# Patient Record
Sex: Female | Born: 1950 | Race: White | Hispanic: No | Marital: Married | Smoking: Never smoker
Health system: Southern US, Community
[De-identification: ages and names within clinical notes are randomized; demographics above are authoritative.]

## PROBLEM LIST (undated history)

## (undated) DIAGNOSIS — Z8489 Family history of other specified conditions: Secondary | ICD-10-CM

## (undated) DIAGNOSIS — I10 Essential (primary) hypertension: Secondary | ICD-10-CM

## (undated) DIAGNOSIS — R112 Nausea with vomiting, unspecified: Secondary | ICD-10-CM

## (undated) DIAGNOSIS — Z9889 Other specified postprocedural states: Secondary | ICD-10-CM

## (undated) DIAGNOSIS — E213 Hyperparathyroidism, unspecified: Secondary | ICD-10-CM

## (undated) DIAGNOSIS — E119 Type 2 diabetes mellitus without complications: Secondary | ICD-10-CM

## (undated) HISTORY — PX: ABDOMINAL HYSTERECTOMY: SHX81

## (undated) HISTORY — PX: TONSILLECTOMY: SUR1361

## (undated) HISTORY — DX: Essential (primary) hypertension: I10

## (undated) HISTORY — PX: CHOLECYSTECTOMY: SHX55

## (undated) HISTORY — DX: Type 2 diabetes mellitus without complications: E11.9

## (undated) HISTORY — DX: Hyperparathyroidism, unspecified: E21.3

## (undated) HISTORY — PX: PARATHYROIDECTOMY: SHX19

---

## 2017-11-28 DIAGNOSIS — C801 Malignant (primary) neoplasm, unspecified: Secondary | ICD-10-CM

## 2017-11-28 HISTORY — DX: Malignant (primary) neoplasm, unspecified: C80.1

## 2019-12-03 ENCOUNTER — Encounter: Payer: Self-pay | Admitting: Oncology

## 2019-12-03 ENCOUNTER — Ambulatory Visit
Admission: RE | Admit: 2019-12-03 | Discharge: 2019-12-03 | Disposition: A | Discharge status: Home / Self Care | Payer: Self-pay | Source: Ambulatory Visit | Attending: Hematology and Oncology | Admitting: Hematology and Oncology

## 2019-12-03 ENCOUNTER — Telehealth: Payer: Self-pay | Admitting: Oncology

## 2019-12-03 DIAGNOSIS — C55 Malignant neoplasm of uterus, part unspecified: Secondary | ICD-10-CM

## 2019-12-03 NOTE — Telephone Encounter (Signed)
I can see her next Tuesday at 9 or Wed at 10 am Need 1 hour due to materials for review I cannot guarantee any treatment to be started until I see her

## 2019-12-03 NOTE — Telephone Encounter (Signed)
Called and notified Dr. Romie Levee office with [facility] that patient has an appointment on 12/11/2019 with Dr. Alvy Bimler.

## 2019-12-03 NOTE — Telephone Encounter (Signed)
Called Sandra Arroyo back and scheduled new patient appointment with Dr. Alvy Bimler on 12/11/19 at 10 am.

## 2019-12-03 NOTE — Telephone Encounter (Signed)
Called Sandra Arroyo and discussed her referral.  She said she is going to be staying with her sister, [name]. The address will be [address] [place], [place], [place] [address].  She will keep her cell phone number and also provided her sister's number as [phone].  She said she can be in [place] anytime next week for an appointment.  She had her last cycle of chemotherapy on 11/12/19 (docetaxel plus carboplatin and was due for her next cycle yesterday.  She said it was delayed because she had a lot of side effects from her last cycle.  She lost 14 lbs and had weakness and diarrhea.    Discussed her imaging and she said everything was done at [facility].  Requested powershare of PET scan from 08/27/19 and CT abd/pelvis from 05/06/19 with [facility] CT.

## 2019-12-09 ENCOUNTER — Inpatient Hospital Stay
Admission: RE | Admit: 2019-12-09 | Discharge: 2019-12-09 | Disposition: A | Discharge status: Home / Self Care | Payer: Self-pay | Source: Ambulatory Visit | Attending: Hematology and Oncology | Admitting: Hematology and Oncology

## 2019-12-09 ENCOUNTER — Encounter: Payer: Self-pay | Admitting: Oncology

## 2019-12-09 ENCOUNTER — Other Ambulatory Visit (HOSPITAL_COMMUNITY): Payer: Self-pay | Admitting: Hematology and Oncology

## 2019-12-09 DIAGNOSIS — C801 Malignant (primary) neoplasm, unspecified: Secondary | ICD-10-CM

## 2019-12-11 ENCOUNTER — Encounter: Payer: Self-pay | Admitting: Hematology and Oncology

## 2019-12-11 ENCOUNTER — Encounter: Payer: Self-pay | Admitting: Oncology

## 2019-12-11 ENCOUNTER — Inpatient Hospital Stay: Payer: Medicare Other

## 2019-12-11 ENCOUNTER — Other Ambulatory Visit: Payer: Self-pay

## 2019-12-11 ENCOUNTER — Inpatient Hospital Stay: Payer: Medicare Other | Attending: Hematology and Oncology | Admitting: Hematology and Oncology

## 2019-12-11 DIAGNOSIS — R5383 Other fatigue: Secondary | ICD-10-CM | POA: Diagnosis not present

## 2019-12-11 DIAGNOSIS — T451X5A Adverse effect of antineoplastic and immunosuppressive drugs, initial encounter: Secondary | ICD-10-CM

## 2019-12-11 DIAGNOSIS — Z809 Family history of malignant neoplasm, unspecified: Secondary | ICD-10-CM

## 2019-12-11 DIAGNOSIS — C779 Secondary and unspecified malignant neoplasm of lymph node, unspecified: Secondary | ICD-10-CM | POA: Insufficient documentation

## 2019-12-11 DIAGNOSIS — C55 Malignant neoplasm of uterus, part unspecified: Secondary | ICD-10-CM | POA: Diagnosis present

## 2019-12-11 DIAGNOSIS — R918 Other nonspecific abnormal finding of lung field: Secondary | ICD-10-CM | POA: Diagnosis not present

## 2019-12-11 DIAGNOSIS — D61810 Antineoplastic chemotherapy induced pancytopenia: Secondary | ICD-10-CM | POA: Diagnosis not present

## 2019-12-11 DIAGNOSIS — E119 Type 2 diabetes mellitus without complications: Secondary | ICD-10-CM

## 2019-12-11 DIAGNOSIS — C778 Secondary and unspecified malignant neoplasm of lymph nodes of multiple regions: Secondary | ICD-10-CM

## 2019-12-11 DIAGNOSIS — G620 Drug-induced polyneuropathy: Secondary | ICD-10-CM | POA: Insufficient documentation

## 2019-12-11 DIAGNOSIS — I10 Essential (primary) hypertension: Secondary | ICD-10-CM | POA: Diagnosis not present

## 2019-12-11 DIAGNOSIS — Z7189 Other specified counseling: Secondary | ICD-10-CM

## 2019-12-11 DIAGNOSIS — D61818 Other pancytopenia: Secondary | ICD-10-CM | POA: Insufficient documentation

## 2019-12-11 LAB — COMPREHENSIVE METABOLIC PANEL
ALT: 13 U/L (ref 0–44)
AST: 16 U/L (ref 15–41)
Albumin: 3.7 g/dL (ref 3.5–5.0)
Alkaline Phosphatase: 57 U/L (ref 38–126)
Anion gap: 9 (ref 5–15)
BUN: 11 mg/dL (ref 8–23)
CO2: 24 mmol/L (ref 22–32)
Calcium: 9.3 mg/dL (ref 8.9–10.3)
Chloride: 109 mmol/L (ref 98–111)
Creatinine, Ser: 0.79 mg/dL (ref 0.44–1.00)
GFR calc Af Amer: >60 mL/min (ref >60)
GFR calc non Af Amer: >60 mL/min (ref >60)
Glucose, Bld: 145 mg/dL — ABNORMAL HIGH (ref 70–99)
Potassium: 4.2 mmol/L (ref 3.5–5.1)
Sodium: 142 mmol/L (ref 135–145)
Total Bilirubin: 0.6 mg/dL (ref 0.3–1.2)
Total Protein: 6.5 g/dL (ref 6.5–8.1)

## 2019-12-11 LAB — CBC WITH DIFFERENTIAL/PLATELET
Abs Immature Granulocytes: 0.07 10*3/uL (ref 0.00–0.07)
Basophils Absolute: 0.1 10*3/uL (ref 0.0–0.1)
Basophils Relative: 2 %
Eosinophils Absolute: 0.1 10*3/uL (ref 0.0–0.5)
Eosinophils Relative: 2 %
HCT: 33.6 % — ABNORMAL LOW (ref 36.0–46.0)
Hemoglobin: 10.8 g/dL — ABNORMAL LOW (ref 12.0–15.0)
Immature Granulocytes: 2 %
Lymphocytes Relative: 23 %
Lymphs Abs: 0.8 10*3/uL (ref 0.7–4.0)
MCH: 31 pg (ref 26.0–34.0)
MCHC: 32.1 g/dL (ref 30.0–36.0)
MCV: 96.6 fL (ref 80.0–100.0)
Monocytes Absolute: 0.4 10*3/uL (ref 0.1–1.0)
Monocytes Relative: 12 %
Neutro Abs: 2 10*3/uL (ref 1.7–7.7)
Neutrophils Relative %: 59 %
Platelets: 230 10*3/uL (ref 150–400)
RBC: 3.48 MIL/uL — ABNORMAL LOW (ref 3.87–5.11)
RDW: 15.7 % — ABNORMAL HIGH (ref 11.5–15.5)
WBC: 3.3 10*3/uL — ABNORMAL LOW (ref 4.0–10.5)
nRBC: 0 % (ref 0.0–0.2)

## 2019-12-11 LAB — TSH: TSH: 1.26 u[IU]/mL (ref 0.308–3.960)

## 2019-12-11 NOTE — Progress Notes (Signed)
[facility] at [phone] and requested Her2Neu/Fish report.  Met with Santiago Glad during Dr. Calton Dach appointment.  Gave her the [employer] and encouraged her to call with any questions.

## 2019-12-12 ENCOUNTER — Telehealth: Payer: Self-pay | Admitting: Hematology and Oncology

## 2019-12-12 DIAGNOSIS — Z7189 Other specified counseling: Secondary | ICD-10-CM | POA: Insufficient documentation

## 2019-12-12 NOTE — Assessment & Plan Note (Signed)
She is on multiple blood pressure medications She has lost a lot of weight She may not need that many different type of blood pressure medications Recommend she checks her blood pressure twice a day and we will review her blood pressure from home to decide if her medications need to be adjusted

## 2019-12-12 NOTE — Assessment & Plan Note (Addendum)
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

## 2019-12-12 NOTE — Assessment & Plan Note (Signed)
She had recent severe pancytopenia due to treatment She would benefit from G-CSF support for future treatment For now, we discussed neutropenic precaution

## 2019-12-12 NOTE — Assessment & Plan Note (Signed)
She has stable, mild grade 2 neuropathy affecting the left foot that does not bother her We will observe for now

## 2019-12-12 NOTE — Progress Notes (Signed)
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, status post 3 cycle of carboplatin and Taxotere in [place], complicated by severe weight loss, pancytopenia, mild neuropathy and weight loss There are still missing information from over 300 pages of outside records I will request pathology report from September 13, 2019 for completion of records I recommend ordering baseline blood work along with CT scan of the chest, abdomen and pelvis for assessment of response of therapy before we proceed with further treatment I will also schedule chemo education class when she returns She is in agreement  Diabetes mellitus without complication, without long-term current use of insulin ([place]) Her diabetes control is reasonable She will continue current prescribed medications  Essential hypertension She is on multiple blood pressure medications She has lost a lot of weight She may not need that many different type of blood pressure medications Recommend she checks her blood pressure twice a day and we will review her blood pressure from home to decide if her medications need to be adjusted  Family history of cancer I will review her case with genetic counselor to see if she will qualify for genetic testing  Other fatigue I will check her thyroid function test  Pancytopenia, acquired ([place]) She had recent severe pancytopenia due to treatment She would benefit from G-CSF support for future treatment For now, we discussed neutropenic precaution  Peripheral neuropathy due to chemotherapy [facility]) She has stable, mild grade 2 neuropathy affecting the left foot that does not bother her We will observe for now  Goals of care, counseling/discussion I have not have a chance to address  goals of care with her and her sister Typically, patients with recurrent cancer are not curable We will have further discussions about goals of care in her next visit   Orders Placed This Encounter  Procedures  . CT Chest W Contrast    Standing Status:   Future    Standing Expiration Date:   12/10/2020    Order Specific Question:   If indicated for the ordered procedure, I authorize the administration of contrast media per Radiology protocol    Answer:   Yes    Order Specific Question:   Preferred imaging location?    Answer:   [facility]    Order Specific Question:   Radiology Contrast Protocol - do NOT remove file path    Answer:   \\[email or web address]  . CT Abdomen Pelvis W Contrast    Standing Status:   Future    Standing Expiration Date:   12/10/2020    Order Specific Question:   If indicated for the ordered procedure, I authorize the administration of contrast media per Radiology protocol    Answer:   Yes    Order Specific Question:   Preferred imaging location?    Answer:   [facility]    Order Specific Question:   Radiology Contrast Protocol - do NOT remove file path    Answer:   \\[email or web address]  . Comprehensive metabolic panel    Standing Status:   Future    Number of Occurrences:   1    Standing Expiration Date:   01/14/2021  . TSH    Standing Status:   Future    Number of Occurrences:   1    Standing  Expiration Date:   01/14/2021  . CBC with Differential    Standing Status:   Future    Number of Occurrences:   1    Standing Expiration Date:   01/14/2021    The total time spent in the appointment was 95 minutes encounter with patients including review of chart and various tests results, discussions about plan of care and coordination of care plan   All questions were answered. The patient knows to call the clinic with any problems, questions or concerns. No barriers to learning was detected.  Heath Lark, MD 1/14/20218:24 AM  CHIEF COMPLAINTS/PURPOSE OF CONSULTATION:  Recurrent uterine cancer, transfer of care from [place] to [place] due to better family support here locally  HISTORY OF PRESENTING ILLNESS:  Sandra Arroyo 69 y.o. female is here because of recent diagnosis of recurrent uterine cancer Her sister, and is also present The patient is semiretired.  She is married with 3 children She is relocating to be with her sister, and who is able to provide better supportive care for her while she is undergoing chemotherapy I have reviewed over 300 pages of outside records in aggregate related to her diagnosis of recurrence of endometrial cancer  I have reviewed her chart and materials related to her cancer extensively and collaborated history with the patient. Summary of oncologic history is as follows: Oncology History Overview Note  Her2 neg PD-L neg Low mutation burden MSI stable NTRK neg   Uterine cancer ([place])  12/01/2017 Initial Diagnosis   She presented with postmenopausal bleeding   12/12/2017 Surgery   She had complete hysterectomy, BSO and bilateral pelvic sentinel lymph node excision   01/08/2018 Tumor Marker   Patient's tumor was tested for the following markers: CA-125 Results of the tumor marker test revealed 70.   01/18/2018 Imaging   CT scan of the chest, abdomen and pelvis elsewhere show lung nodules, kidney lesion as well as 1.5 x 1 cm left, iliac lymph node   01/23/2018 - 06/12/2018 Chemotherapy   The patient had adjuvant chemotherapy with carboplatin and taxol elsewhere for chemotherapy treatment.     06/25/2018 Imaging   CT scan of the chest, abdomen and pelvis showed persistent lung nodules, kidney lesion but reduced size of previously noted pelvic lymphadenopathy to 6 x 4 mm   07/09/2018 - 08/17/2018 Radiation Therapy   She had daily radiation treatment for 5 weeks elsewhere in [place], 28 days treatment, 5040 cGy   09/25/2018 Imaging   CT scan of  the chest, abdomen and pelvis shows stable lung nodule and kidney lesion.  Previously noted enlarged lymphadenopathy in the external iliac region has resolved   01/16/2019 Imaging   CT imaging of the chest, abdomen and pelvis elsewhere showed stable kidney lesions but no evidence of cancer recurrence   05/16/2019 Imaging   Outside CT scan showed stable pulmonary nodules.  Bilateral simple renal cyst with an exophytic lesion in the medial cortex of the lower pole of the right kidney measure 10 mm with enhancement postcontrast.  There is 6 mm lymph node in the left para-aortic region.  There is an enlarging hyper enhancing aortocaval lymph node measuring 5 mm.   08/27/2019 PET scan   There is left supraclavicular lymph node measure 7 mm, another additional supraclavicular region 9 mm with SUV of 8.3.  There is abnormal uptake in retrocrural lymph nodes, SUV 3.7.  There is elevated FDG uptake in the patient's known retroperitoneal lymph nodes.  There is a small solid-appearing right  inferior medial renal lesion with no FDG uptake.   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   10/09/2019 -  Chemotherapy   The patient had docetaxel and carboplatin for chemotherapy treatment elsewhere in [place].  Her treatment was initially given weekly.  Cycle 1 was complicated by neutropenia.  Cycle 2 was dose adjusted and delay.  Cycle 2 treatment was complicated by loss of appetite, severe diarrhea and persistent neutropenia.  Cycle 3 was delay as well.  She went to the emergency department twice for IV fluid hydration and have lost a lot of weight.     She was supposed to get cycle 4 of chemotherapy a week ago but due to delayed recovery from multiple side effects of treatment, that was not given Currently, she is at approximately 60% of her energy  level from baseline Her appetite is gradually improving and she have stopped losing weight She has been to local emergency department several times for severe dehydration Despite recurrent neutropenia, she has not been admitted to the hospital for neutropenic fever Her last cycle of treatment was originally given on a weekly basis.  After multiple adjustment and delay, it was subsequently changed to every 3 weeks She was prescribed multiple courses of fluconazole and antibiotics for infection/thrush She had watery bowel movement requiring Imodium as well as Lomotil but her bowel habits are more formed at this point in time She denies nausea or vomiting She has very minor peripheral neuropathy from prior treatment but it does not bother her She denies new palpable lymphadenopathy Her diabetes control at home is reasonable She has not checked her blood pressure lately She denies pain  MEDICAL HISTORY:  Past Medical History:  Diagnosis Date  . Diabetes mellitus without complication ([place])   . Hyperparathyroidism, unspecified ([place])   . Hypertension     SURGICAL HISTORY: Past Surgical History:  Procedure Laterality Date  . ABDOMINAL HYSTERECTOMY    . CESAREAN SECTION    . PARATHYROIDECTOMY    . TONSILLECTOMY      SOCIAL HISTORY: Social History   Socioeconomic History  . Marital status: Married    Spouse name: Edd Arbour  . Number of children: 3  . Years of education: Not on file  . Highest education level: Not on file  Occupational History  . Occupation: Psychologist, educational  Tobacco Use  . Smoking status: Never Smoker  . Smokeless tobacco: Never Used  Substance and Sexual Activity  . Alcohol use: Never  . Drug use: Not on file  . Sexual activity: Not on file  Other Topics Concern  . Not on file  Social History Narrative   3 children in [place] Determinants of Health   Financial Resource Strain:   . Difficulty of Paying Living Expenses: Not on file  Food Insecurity:    . Worried About Charity fundraiser in the Last Year: Not on file  . Ran Out of Food in the Last Year: Not on file  Transportation Needs:   . Lack of Transportation (Medical): Not on file  . Lack of Transportation (Non-Medical): Not on file  Physical Activity:   . Days of Exercise per Week: Not on file  . Minutes of Exercise per Session: Not on file  Stress:   . Feeling of Stress : Not on file  Social Connections:   .  Frequency of Communication with Friends and Family: Not on file  . Frequency of Social Gatherings with Friends and Family: Not on file  . Attends Religious Services: Not on file  . Active Member of Clubs or Organizations: Not on file  . Attends Archivist Meetings: Not on file  . Marital Status: Not on file  Intimate Partner Violence:   . Fear of Current or Ex-Partner: Not on file  . Emotionally Abused: Not on file  . Physically Abused: Not on file  . Sexually Abused: Not on file    FAMILY HISTORY: Family History  Problem Relation Age of Onset  . Cancer Mother 63       breast ca  . Cancer Father        testicular, kidney cancer, liver  . Stroke Father   . Diabetes Father   . Cancer Brother        testicular cancer    ALLERGIES:  has no allergies on file.  MEDICATIONS:  Current Outpatient Medications  Medication Sig Dispense Refill  . aspirin EC 81 MG tablet Take 81 mg by mouth daily.    Marland Kitchen glipiZIDE (GLUCOTROL) 5 MG tablet Take 0.5 tablets by mouth 2 (two) times daily.    Marland Kitchen lidocaine-prilocaine (EMLA) cream Apply 1 application topically as needed.    Marland Kitchen losartan (COZAAR) 100 MG tablet Take 100 mg by mouth daily.    . metoprolol succinate (TOPROL-XL) 100 MG 24 hr tablet Take 100 mg by mouth daily. Take with or immediately following a meal.    . prochlorperazine (COMPAZINE) 10 MG tablet Take 10 mg by mouth every 6 (six) hours as needed.    . rosuvastatin (CRESTOR) 10 MG tablet Take 10 mg by mouth daily.    Marland Kitchen terazosin (HYTRIN) 2 MG capsule Take  2 mg by mouth at bedtime.     No current facility-administered medications for this visit.    REVIEW OF SYSTEMS:   Constitutional: Denies fevers, chills or abnormal night sweats Eyes: Denies blurriness of vision, double vision or watery eyes Ears, nose, mouth, throat, and face: Denies mucositis or sore throat Respiratory: Denies cough, dyspnea or wheezes Cardiovascular: Denies palpitation, chest discomfort or lower extremity swelling Skin: Denies abnormal skin rashes Lymphatics: Denies new lymphadenopathy or easy bruising Behavioral/Psych: Mood is stable, no new changes  All other systems were reviewed with the patient and are negative.  PHYSICAL EXAMINATION: ECOG PERFORMANCE STATUS: 1 - Symptomatic but completely ambulatory  Vitals:   12/11/19 0958  BP: (!) 154/74  Pulse: 63  Resp: 18  Temp: 98 F (36.7 C)  SpO2: 98%   Filed Weights   12/11/19 0958  Weight: 154 lb 9.6 oz (70.1 kg)    GENERAL:alert, no distress and comfortable SKIN: skin color, texture, turgor are normal, no rashes or significant lesions EYES: normal, conjunctiva are pink and non-injected, sclera clear OROPHARYNX:no exudate, no erythema and lips, buccal mucosa, and tongue normal  NECK: supple, thyroid normal size, non-tender, without nodularity LYMPH:  no palpable lymphadenopathy in the cervical, axillary or inguinal LUNGS: clear to auscultation and percussion with normal breathing effort HEART: regular rate & rhythm and no murmurs and no lower extremity edema ABDOMEN:abdomen soft, non-tender and normal bowel sounds.  Noted well-healed surgical scar Musculoskeletal:no cyanosis of digits and no clubbing  PSYCH: alert & oriented x 3 with fluent speech NEURO: no focal motor/sensory deficits  LABORATORY DATA:  I have reviewed the data as listed Lab Results  Component Value Date   WBC  3.3 (L) 12/11/2019   HGB 10.8 (L) 12/11/2019   HCT 33.6 (L) 12/11/2019   MCV 96.6 12/11/2019   PLT 230 12/11/2019    Recent Labs    12/11/19 1100  NA 142  K 4.2  CL 109  CO2 24  GLUCOSE 145*  BUN 11  CREATININE 0.79  CALCIUM 9.3  GFRNONAA >60  GFRAA >60  PROT 6.5  ALBUMIN 3.7  AST 16  ALT 13  ALKPHOS 57  BILITOT 0.6    RADIOGRAPHIC STUDIES: I have reviewed imaging study that was downloaded on PACS I have personally reviewed the radiological images as listed and agreed with the findings in the report.

## 2019-12-12 NOTE — Assessment & Plan Note (Signed)
I have not have a chance to address goals of care with her and her sister Typically, patients with recurrent cancer are not curable We will have further discussions about goals of care in her next visit

## 2019-12-12 NOTE — Assessment & Plan Note (Signed)
I will check her thyroid function test

## 2019-12-12 NOTE — Telephone Encounter (Signed)
Scheduled appt per 11/4 sch message - unable to reach pt . Left message with appt date and time   

## 2019-12-12 NOTE — Assessment & Plan Note (Signed)
I will review her case with genetic counselor to see if she will qualify for genetic testing

## 2019-12-12 NOTE — Assessment & Plan Note (Signed)
Her diabetes control is reasonable She will continue current prescribed medications

## 2019-12-13 ENCOUNTER — Other Ambulatory Visit: Payer: Self-pay

## 2019-12-16 ENCOUNTER — Ambulatory Visit (INDEPENDENT_AMBULATORY_CARE_PROVIDER_SITE_OTHER): Payer: Medicare Other | Admitting: Family Medicine

## 2019-12-16 ENCOUNTER — Other Ambulatory Visit: Payer: Self-pay

## 2019-12-16 ENCOUNTER — Encounter: Payer: Self-pay | Admitting: Family Medicine

## 2019-12-16 VITALS — BP 162/90 | HR 62 | Temp 95.8°F | Ht 62.0 in | Wt 153.0 lb

## 2019-12-16 DIAGNOSIS — I10 Essential (primary) hypertension: Secondary | ICD-10-CM | POA: Diagnosis not present

## 2019-12-16 MED ORDER — AMLODIPINE BESYLATE 5 MG PO TABS: 5.0000 mg | ORAL_TABLET | Freq: Every day | ORAL | 3 refills | Status: DC

## 2019-12-16 MED FILL — AMLODIPINE BESYLATE 5 MG TA: 5 | 30 days supply | Qty: 30 | Fill #0

## 2019-12-16 NOTE — Patient Instructions (Signed)
Keep the diet clean and stay active.  Keep checking your blood pressure at home.  Let me know if there are cost issues.   Let us know if you need anything.

## 2019-12-16 NOTE — Progress Notes (Signed)
Chief Complaint  Patient presents with  . New Patient (Initial Visit)    Temporary during cancer treatments       New Patient Visit SUBJECTIVE: HPI: Sandra Arroyo is an 69 y.o.female who is being seen for establishing care.  The patient was previously seen in [place].  Hypertension Patient presents for hypertension follow up. She does monitor home blood pressures. Blood pressures ranging on average from 150-190's/80-100's. She is compliant with medications. Patient has these side effects of medication: none She is adhering to a healthy diet overall. Exercise: walking   Past Medical History:  Diagnosis Date  . Diabetes mellitus without complication ([place])   . Hyperparathyroidism, unspecified ([place])   . Hypertension    Past Surgical History:  Procedure Laterality Date  . ABDOMINAL HYSTERECTOMY    . CESAREAN SECTION    . PARATHYROIDECTOMY    . TONSILLECTOMY     Family History  Problem Relation Age of Onset  . Cancer Mother 50       breast ca  . Cancer Father        testicular, kidney cancer, liver  . Stroke Father   . Diabetes Father   . Cancer Brother        testicular cancer   Allergies  Allergen Reactions  . Verapamil Rash    Current Outpatient Medications:  .  aspirin EC 81 MG tablet, Take 81 mg by mouth daily., Disp: , Rfl:  .  glipiZIDE (GLUCOTROL) 5 MG tablet, Take 0.5 tablets by mouth 2 (two) times daily., Disp: , Rfl:  .  lidocaine-prilocaine (EMLA) cream, Apply 1 application topically as needed., Disp: , Rfl:  .  losartan (COZAAR) 100 MG tablet, Take 100 mg by mouth daily., Disp: , Rfl:  .  metoprolol succinate (TOPROL-XL) 100 MG 24 hr tablet, Take 100 mg by mouth daily. Take with or immediately following a meal., Disp: , Rfl:  .  prochlorperazine (COMPAZINE) 10 MG tablet, Take 10 mg by mouth every 6 (six) hours as needed., Disp: , Rfl:  .  rosuvastatin (CRESTOR) 10 MG tablet, Take 10 mg by mouth daily., Disp: , Rfl:  .  terazosin (HYTRIN) 2 MG capsule, Take  2 mg by mouth at bedtime., Disp: , Rfl:  .  amLODipine (NORVASC) 5 MG tablet, Take 1 tablet (5 mg total) by mouth daily., Disp: 30 tablet, Rfl: 3  No LMP recorded.  ROS Cardiovascular: Denies chest pain  Respiratory: Denies dyspnea   OBJECTIVE: BP (!) 162/90 (BP Location: Left Arm, Patient Position: Sitting, Cuff Size: Normal)   Pulse 62   Temp (!) 95.8 F (35.4 C) (Temporal)   Ht 5\' 2"  (1.575 m)   Wt 153 lb (69.4 kg)   SpO2 93%   BMI 27.98 kg/m  General:  well developed, well nourished, in no apparent distress Throat/Pharynx:  lips and gingiva without lesion; tongue and uvula midline; non-inflamed pharynx; no exudates or postnasal drainage Lungs:  clear to auscultation, breath sounds equal bilaterally, no respiratory distress Cardio:  regular rate and rhythm, no LE edema or bruits Neuro:  gait normal Psych: well oriented with normal range of affect and appropriate judgment/insight  ASSESSMENT/PLAN: Essential hypertension - Plan: amLODipine (NORVASC) 5 MG tablet  Counseled on diet and exercise. Add Norvasc. Cont losartan, Toprol, terazosin. Cont to monitor home BP's.  Patient should return in 1 mo to reck. The patient voiced understanding and agreement to the plan.   [place], DO 12/16/19  3:29 PM

## 2019-12-18 ENCOUNTER — Encounter (HOSPITAL_COMMUNITY): Payer: Self-pay

## 2019-12-18 ENCOUNTER — Other Ambulatory Visit: Payer: Self-pay

## 2019-12-18 ENCOUNTER — Ambulatory Visit (HOSPITAL_COMMUNITY)
Admission: RE | Admit: 2019-12-18 | Discharge: 2019-12-18 | Disposition: A | Discharge status: Home / Self Care | Payer: Medicare Other | Source: Ambulatory Visit | Attending: Hematology and Oncology | Admitting: Hematology and Oncology

## 2019-12-18 DIAGNOSIS — C778 Secondary and unspecified malignant neoplasm of lymph nodes of multiple regions: Secondary | ICD-10-CM | POA: Diagnosis present

## 2019-12-18 DIAGNOSIS — C55 Malignant neoplasm of uterus, part unspecified: Secondary | ICD-10-CM | POA: Diagnosis not present

## 2019-12-18 MED ORDER — IOHEXOL 300 MG/ML  SOLN
100.0000 mL | Freq: Once | INTRAMUSCULAR | Status: AC | PRN
  Administered 2019-12-18: 100 mL via INTRAVENOUS

## 2019-12-18 MED ORDER — SODIUM CHLORIDE (PF) 0.9 % IJ SOLN
INTRAMUSCULAR | Status: AC
  Filled 2019-12-18: qty 50

## 2019-12-19 ENCOUNTER — Inpatient Hospital Stay: Payer: Medicare Other

## 2019-12-19 ENCOUNTER — Other Ambulatory Visit: Payer: Self-pay

## 2019-12-19 ENCOUNTER — Encounter: Payer: Self-pay | Admitting: Hematology and Oncology

## 2019-12-19 ENCOUNTER — Inpatient Hospital Stay (HOSPITAL_BASED_OUTPATIENT_CLINIC_OR_DEPARTMENT_OTHER): Payer: Medicare Other | Admitting: Hematology and Oncology

## 2019-12-19 ENCOUNTER — Telehealth: Payer: Self-pay | Admitting: Hematology and Oncology

## 2019-12-19 ENCOUNTER — Other Ambulatory Visit: Payer: Self-pay | Admitting: Hematology and Oncology

## 2019-12-19 DIAGNOSIS — N289 Disorder of kidney and ureter, unspecified: Secondary | ICD-10-CM | POA: Insufficient documentation

## 2019-12-19 DIAGNOSIS — R918 Other nonspecific abnormal finding of lung field: Secondary | ICD-10-CM

## 2019-12-19 DIAGNOSIS — C55 Malignant neoplasm of uterus, part unspecified: Secondary | ICD-10-CM | POA: Diagnosis not present

## 2019-12-19 DIAGNOSIS — C778 Secondary and unspecified malignant neoplasm of lymph nodes of multiple regions: Secondary | ICD-10-CM

## 2019-12-19 DIAGNOSIS — D61818 Other pancytopenia: Secondary | ICD-10-CM

## 2019-12-19 DIAGNOSIS — I10 Essential (primary) hypertension: Secondary | ICD-10-CM

## 2019-12-19 NOTE — Telephone Encounter (Signed)
Scheduled per 1/21 sch msg. Called and left msg. Mailing printout 

## 2019-12-19 NOTE — Assessment & Plan Note (Signed)
She has pulmonary infiltrate seen on CT imaging She is not symptomatic It could be due to past infection or related to side effects of her prior treatment Observe only for now Plan to repeat CT imaging in 3 months

## 2019-12-19 NOTE — Assessment & Plan Note (Signed)
She has mild pancytopenia but overall not symptomatic Observe for now

## 2019-12-19 NOTE — Assessment & Plan Note (Signed)
I have reviewed multiple imaging studies and test results with the patient and her sister Currently, she have no evidence of residual disease She is still recovering from side effects of treatment I recommend she takes a chemo holiday I will see her back in a few weeks for blood work, history and physical examination I plan to repeat imaging study again in 3 months, due around April 2021 for further evaluation

## 2019-12-19 NOTE — Assessment & Plan Note (Signed)
She is asymptomatic from the kidney lesion I recommend observation only and surveillance imaging Her renal function is normal

## 2019-12-19 NOTE — Progress Notes (Signed)
[facility] OFFICE PROGRESS NOTE  Patient Care Team: Wendling, Nicholas Paul, DO as PCP - General (Family Medicine)  ASSESSMENT & PLAN:  Uterine cancer (HCC) I have reviewed multiple imaging studies and test results with the patient and her sister Currently, she have no evidence of residual disease She is still recovering from side effects of treatment I recommend she takes a chemo holiday I will see her back in a few weeks for blood work, history and physical examination I plan to repeat imaging study again in 3 months, due around April 2021 for further evaluation  Pancytopenia, acquired (HCC) She has mild pancytopenia but overall not symptomatic Observe for now   Pulmonary infiltrates She has pulmonary infiltrate seen on CT imaging She is not symptomatic It could be due to past infection or related to side effects of her prior treatment Observe only for now Plan to repeat CT imaging in 3 months  Essential hypertension Her blood pressure is profoundly elevated She has been started on diuretic therapy by her primary care doctor She will continue close monitoring of blood pressure and medication adjustment with her primary care doctor  Kidney lesion, native, left She is asymptomatic from the kidney lesion I recommend observation only and surveillance imaging Her renal function is normal   No orders of the defined types were placed in this encounter.   All questions were answered. The patient knows to call the clinic with any problems, questions or concerns. The total time spent in the appointment was 25 minutes encounter with patients including review of chart and various tests results, discussions about plan of care and coordination of care plan   Ni Gorsuch, MD 12/19/2019 11:42 AM  INTERVAL HISTORY: Please see below for problem oriented charting. She returns for further follow-up and review of test results She have establish new primary care doctor and  was started on blood pressure medications Her blood pressure monitoring from home is quite elevated but she is not symptomatic She had no other new symptoms since last time I saw her such as cough, chest pain or shortness of breath  SUMMARY OF ONCOLOGIC HISTORY: Oncology History Overview Note  Her2 neg PD-L neg Low mutation burden MSI stable NTRK neg   Uterine cancer (HCC)  12/01/2017 Initial Diagnosis   She presented with postmenopausal bleeding   12/12/2017 Surgery   She had complete hysterectomy, BSO and bilateral pelvic sentinel lymph node excision   01/08/2018 Tumor Marker   Patient's tumor was tested for the following markers: CA-125 Results of the tumor marker test revealed 70.   01/18/2018 Imaging   CT scan of the chest, abdomen and pelvis elsewhere show lung nodules, kidney lesion as well as 1.5 x 1 cm left, iliac lymph node   01/23/2018 - 06/12/2018 Chemotherapy   The patient had adjuvant chemotherapy with carboplatin and taxol elsewhere for chemotherapy treatment.     06/25/2018 Imaging   CT scan of the chest, abdomen and pelvis showed persistent lung nodules, kidney lesion but reduced size of previously noted pelvic lymphadenopathy to 6 x 4 mm   07/09/2018 - 08/17/2018 Radiation Therapy   She had daily radiation treatment for 5 weeks elsewhere in [place], 28 days treatment, 5040 cGy   09/25/2018 Imaging   CT scan of the chest, abdomen and pelvis shows stable lung nodule and kidney lesion.  Previously noted enlarged lymphadenopathy in the external iliac region has resolved   01/16/2019 Imaging   CT imaging of the chest, abdomen and pelvis   elsewhere showed stable kidney lesions but no evidence of cancer recurrence   05/16/2019 Imaging   Outside CT scan showed stable pulmonary nodules.  Bilateral simple renal cyst with an exophytic lesion in the medial cortex of the lower pole of the right kidney measure 10 mm with enhancement postcontrast.  There is 6 mm lymph node in the  left para-aortic region.  There is an enlarging hyper enhancing aortocaval lymph node measuring 5 mm.   08/27/2019 PET scan   There is left supraclavicular lymph node measure 7 mm, another additional supraclavicular region 9 mm with SUV of 8.3.  There is abnormal uptake in retrocrural lymph nodes, SUV 3.7.  There is elevated FDG uptake in the patient's known retroperitoneal lymph nodes.  There is a small solid-appearing right inferior medial renal lesion with no FDG uptake.   09/13/2019 Procedure   She underwent biopsy of 2 left supraclavicular lymph node elsewhere   09/13/2019 Pathology Results   Immunohistochemical stains performed on the biopsy report showed that the tumor cells are strongly and diffusely positive for p16, PAX 8, p53, CK7, pancytokeratin and beta-catenin.  CA-125, CK20 and TTF-1 are negative.  Ki-67 stains approximately 50% of tumor cells.  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   10/09/2019 -  Chemotherapy   The patient had docetaxel and carboplatin for chemotherapy treatment elsewhere in [place].  Her treatment was initially given weekly.  Cycle 1 was complicated by neutropenia.  Cycle 2 was dose adjusted and delay.  Cycle 2 treatment was complicated by loss of appetite, severe diarrhea and persistent neutropenia.  Cycle 3 was delay as well.  She went to the emergency department twice for IV fluid hydration and have lost a lot of weight.    12/18/2019 Imaging   CT chest, abdomen and pelvis 1. Interval resolution of the left supraclavicular, retroperitoneal, and right retrocrural lymphadenopathy seen on previous diagnostic CT and PET-CT. Tiny lymph nodes are seen in the retroperitoneal region today. 2. Tiny bilateral pulmonary nodules, indeterminate. These do not appear substantially changed although we do not have a prior diagnostic CT chest to compare. Previous PET-CT is a free breathing exam which  obscure small pulmonary nodules. Attention on follow-up recommended. 3. Bilateral interstitial and patchy peripheral ground-glass opacity in both lungs, nonspecific but potentially related to infectious/inflammatory process or scarring. 4. 10 mm enhancing lesion lower pole right kidney, suspicious for neoplasm. MRI without and with contrast recommended to further evaluate. 5. Small hiatal hernia. 6. Enlargement of the pulmonary outflow tract and main pulmonary arteries compatible with pulmonary arterial hypertension.     REVIEW OF SYSTEMS:   Constitutional: Denies fevers, chills or abnormal weight loss Eyes: Denies blurriness of vision Ears, nose, mouth, throat, and face: Denies mucositis or sore throat Respiratory: Denies cough, dyspnea or wheezes Cardiovascular: Denies palpitation, chest discomfort or lower extremity swelling Gastrointestinal:  Denies nausea, heartburn or change in bowel habits Skin: Denies abnormal skin rashes Lymphatics: Denies new lymphadenopathy or easy bruising Neurological:Denies numbness, tingling or new weaknesses Behavioral/Psych: Mood is stable, no new changes  All other systems were reviewed with the patient and are negative.  I have reviewed the past medical history, past surgical history, social history and family history with the patient and they are unchanged from previous note.  ALLERGIES:  is allergic to verapamil.  MEDICATIONS:  Current Outpatient Medications  Medication Sig Dispense Refill  . amLODipine (NORVASC) 5 MG tablet Take 1 tablet (5 mg total) by  mouth daily. 30 tablet 3  . aspirin EC 81 MG tablet Take 81 mg by mouth daily.    . glipiZIDE (GLUCOTROL) 5 MG tablet Take 0.5 tablets by mouth 2 (two) times daily.    . lidocaine-prilocaine (EMLA) cream Apply 1 application topically as needed.    . losartan (COZAAR) 100 MG tablet Take 100 mg by mouth daily.    . metoprolol succinate (TOPROL-XL) 100 MG 24 hr tablet Take 100 mg by mouth daily.  Take with or immediately following a meal.    . prochlorperazine (COMPAZINE) 10 MG tablet Take 10 mg by mouth every 6 (six) hours as needed.    . rosuvastatin (CRESTOR) 10 MG tablet Take 10 mg by mouth daily.    . terazosin (HYTRIN) 2 MG capsule Take 2 mg by mouth at bedtime.     No current facility-administered medications for this visit.    PHYSICAL EXAMINATION: ECOG PERFORMANCE STATUS: 1 - Symptomatic but completely ambulatory  Vitals:   12/19/19 1030  BP: (!) 159/65  Pulse: 63  Resp: 18  Temp: 97.8 F (36.6 C)  SpO2: 96%   Filed Weights   12/19/19 1030  Weight: 153 lb (69.4 kg)    GENERAL:alert, no distress and comfortable Musculoskeletal:no cyanosis of digits and no clubbing  NEURO: alert & oriented x 3 with fluent speech, no focal motor/sensory deficits  LABORATORY DATA:  I have reviewed the data as listed    Component Value Date/Time   NA 142 12/11/2019 1100   K 4.2 12/11/2019 1100   CL 109 12/11/2019 1100   CO2 24 12/11/2019 1100   GLUCOSE 145 (H) 12/11/2019 1100   BUN 11 12/11/2019 1100   CREATININE 0.79 12/11/2019 1100   CALCIUM 9.3 12/11/2019 1100   PROT 6.5 12/11/2019 1100   ALBUMIN 3.7 12/11/2019 1100   AST 16 12/11/2019 1100   ALT 13 12/11/2019 1100   ALKPHOS 57 12/11/2019 1100   BILITOT 0.6 12/11/2019 1100   GFRNONAA >60 12/11/2019 1100   GFRAA >60 12/11/2019 1100    No results found for: SPEP, UPEP  Lab Results  Component Value Date   WBC 3.3 (L) 12/11/2019   NEUTROABS 2.0 12/11/2019   HGB 10.8 (L) 12/11/2019   HCT 33.6 (L) 12/11/2019   MCV 96.6 12/11/2019   PLT 230 12/11/2019      Chemistry      Component Value Date/Time   NA 142 12/11/2019 1100   K 4.2 12/11/2019 1100   CL 109 12/11/2019 1100   CO2 24 12/11/2019 1100   BUN 11 12/11/2019 1100   CREATININE 0.79 12/11/2019 1100      Component Value Date/Time   CALCIUM 9.3 12/11/2019 1100   ALKPHOS 57 12/11/2019 1100   AST 16 12/11/2019 1100   ALT 13 12/11/2019 1100   BILITOT  0.6 12/11/2019 1100       RADIOGRAPHIC STUDIES: I have reviewed multiple imaging studies with the patient and her sister I have personally reviewed the radiological images as listed and agreed with the findings in the report. CT Chest W Contrast  Result Date: 12/18/2019 CLINICAL DATA:  Uterine carcinosarcoma. EXAM: CT CHEST, ABDOMEN, AND PELVIS WITH CONTRAST TECHNIQUE: Multidetector CT imaging of the chest, abdomen and pelvis was performed following the standard protocol during bolus administration of intravenous contrast. CONTRAST:  100mL OMNIPAQUE IOHEXOL 300 MG/ML  SOLN COMPARISON:  [facility] PET-CT dated 08/27/2019. report for this exam reviewed on [facility] Care Everywhere[facility] FINDINGS: CT CHEST FINDINGS Cardiovascular:   Heart is enlarged. No pericardial effusion. Coronary artery calcification is evident. Enlargement of the pulmonary outflow tract and main pulmonary arteries is compatible with pulmonary arterial hypertension. Right Port-A-Cath tip is positioned at the SVC/RA junction. Mediastinum/Nodes: No mediastinal lymphadenopathy. There is no hilar lymphadenopathy. Small hiatal hernia. The esophagus has normal imaging features. There is no axillary lymphadenopathy. Previous PET-CT report documented a 7 mm short axis left supraclavicular node that was hypermetabolic. This node has resolved in the interval. Lungs/Pleura: The central tracheobronchial airways are patent. There is bilateral interstitial and patchy peripheral ground-glass opacity in both lungs, nonspecific but potentially related to infectious/inflammatory process or scarring. 5 mm pulmonary nodule peripheral right lung base on 79/9 was about 3 mm on 08/14/2019. 4 mm pulmonary nodule in the medial right lower lobe (68/9) is not definitely seen on prior PET-CT. 4 mm left lower lobe subpleural nodule on 91/9 is similar to prior. Musculoskeletal: No worrisome lytic or sclerotic osseous abnormality. CT  ABDOMEN PELVIS FINDINGS Hepatobiliary: No suspicious focal abnormality within the liver parenchyma. Nonvisualization of the gallbladder suggest prior cholecystectomy. No intrahepatic or extrahepatic biliary dilation. Pancreas: No focal mass lesion. No dilatation of the main duct. No intraparenchymal cyst. No peripancreatic edema. Spleen: No splenomegaly. No focal mass lesion. Adrenals/Urinary Tract: No adrenal nodule or mass. Multiple bilateral renal cysts are identified. 1.5 cm exophytic lesion lower pole left kidney has attenuation too high to be a simple cyst. 10 mm enhancing lesion lower pole right kidney (see coronal 88/7) is suspicious for neoplasm. No evidence for hydroureter. The urinary bladder appears normal for the degree of distention. Stomach/Bowel: Small hiatal hernia. Stomach otherwise unremarkable. Duodenum is normally positioned as is the ligament of Treitz. No small bowel wall thickening. No small bowel dilatation. The terminal ileum is normal. The appendix is normal. No gross colonic mass. No colonic wall thickening. Diverticular changes are noted in the left colon without evidence of diverticulitis. Vascular/Lymphatic: There is abdominal aortic atherosclerosis without aneurysm. The small right retrocrural lymph node seen to be hypermetabolic on previous PET-CT have resolved in the interval. Patient also had small para-aortic lymph nodes on prior imaging that word noted to be hypermetabolic. These have decreased in the interval. Index 3 mm short axis left para-aortic node on 72/2 was 9 mm on the prior study. 3-4 mm short axis aortocaval lymph node on 68/2 today was 8 mm when I remeasure in a similar fashion on the prior study. No pelvic sidewall lymphadenopathy. Reproductive: Uterus surgically absent.  There is no adnexal mass. Other: No intraperitoneal free fluid. Musculoskeletal: No worrisome lytic or sclerotic osseous abnormality. IMPRESSION: 1. Interval resolution of the left supraclavicular,  retroperitoneal, and right retrocrural lymphadenopathy seen on previous diagnostic CT and PET-CT. Tiny lymph nodes are seen in the retroperitoneal region today. 2. Tiny bilateral pulmonary nodules, indeterminate. These do not appear substantially changed although we do not have a prior diagnostic CT chest to compare. Previous PET-CT is a free breathing exam which obscure small pulmonary nodules. Attention on follow-up recommended. 3. Bilateral interstitial and patchy peripheral ground-glass opacity in both lungs, nonspecific but potentially related to infectious/inflammatory process or scarring. 4. 10 mm enhancing lesion lower pole right kidney, suspicious for neoplasm. MRI without and with contrast recommended to further evaluate. 5. Small hiatal hernia. 6. Enlargement of the pulmonary outflow tract and main pulmonary arteries compatible with pulmonary arterial hypertension. Electronically Signed   By: Eric  Mansell M.D.   On: 12/18/2019 11:26   CT Abdomen Pelvis W Contrast  Result   Date: 12/18/2019 CLINICAL DATA:  Uterine carcinosarcoma. EXAM: CT CHEST, ABDOMEN, AND PELVIS WITH CONTRAST TECHNIQUE: Multidetector CT imaging of the chest, abdomen and pelvis was performed following the standard protocol during bolus administration of intravenous contrast. CONTRAST:  100mL OMNIPAQUE IOHEXOL 300 MG/ML  SOLN COMPARISON:  [facility] PET-CT dated 08/27/2019. report for this exam reviewed on EP[facility] Care Everywhere[facility] FINDINGS: CT CHEST FINDINGS Cardiovascular: Heart is enlarged. No pericardial effusion. Coronary artery calcification is evident. Enlargement of the pulmonary outflow tract and main pulmonary arteries is compatible with pulmonary arterial hypertension. Right Port-A-Cath tip is positioned at the SVC/RA junction. Mediastinum/Nodes: No mediastinal lymphadenopathy. There is no hilar lymphadenopathy. Small hiatal hernia. The esophagus has normal imaging features. There is  no axillary lymphadenopathy. Previous PET-CT report documented a 7 mm short axis left supraclavicular node that was hypermetabolic. This node has resolved in the interval. Lungs/Pleura: The central tracheobronchial airways are patent. There is bilateral interstitial and patchy peripheral ground-glass opacity in both lungs, nonspecific but potentially related to infectious/inflammatory process or scarring. 5 mm pulmonary nodule peripheral right lung base on 79/9 was about 3 mm on 08/14/2019. 4 mm pulmonary nodule in the medial right lower lobe (68/9) is not definitely seen on prior PET-CT. 4 mm left lower lobe subpleural nodule on 91/9 is similar to prior. Musculoskeletal: No worrisome lytic or sclerotic osseous abnormality. CT ABDOMEN PELVIS FINDINGS Hepatobiliary: No suspicious focal abnormality within the liver parenchyma. Nonvisualization of the gallbladder suggest prior cholecystectomy. No intrahepatic or extrahepatic biliary dilation. Pancreas: No focal mass lesion. No dilatation of the main duct. No intraparenchymal cyst. No peripancreatic edema. Spleen: No splenomegaly. No focal mass lesion. Adrenals/Urinary Tract: No adrenal nodule or mass. Multiple bilateral renal cysts are identified. 1.5 cm exophytic lesion lower pole left kidney has attenuation too high to be a simple cyst. 10 mm enhancing lesion lower pole right kidney (see coronal 88/7) is suspicious for neoplasm. No evidence for hydroureter. The urinary bladder appears normal for the degree of distention. Stomach/Bowel: Small hiatal hernia. Stomach otherwise unremarkable. Duodenum is normally positioned as is the ligament of Treitz. No small bowel wall thickening. No small bowel dilatation. The terminal ileum is normal. The appendix is normal. No gross colonic mass. No colonic wall thickening. Diverticular changes are noted in the left colon without evidence of diverticulitis. Vascular/Lymphatic: There is abdominal aortic atherosclerosis without  aneurysm. The small right retrocrural lymph node seen to be hypermetabolic on previous PET-CT have resolved in the interval. Patient also had small para-aortic lymph nodes on prior imaging that word noted to be hypermetabolic. These have decreased in the interval. Index 3 mm short axis left para-aortic node on 72/2 was 9 mm on the prior study. 3-4 mm short axis aortocaval lymph node on 68/2 today was 8 mm when I remeasure in a similar fashion on the prior study. No pelvic sidewall lymphadenopathy. Reproductive: Uterus surgically absent.  There is no adnexal mass. Other: No intraperitoneal free fluid. Musculoskeletal: No worrisome lytic or sclerotic osseous abnormality. IMPRESSION: 1. Interval resolution of the left supraclavicular, retroperitoneal, and right retrocrural lymphadenopathy seen on previous diagnostic CT and PET-CT. Tiny lymph nodes are seen in the retroperitoneal region today. 2. Tiny bilateral pulmonary nodules, indeterminate. These do not appear substantially changed although we do not have a prior diagnostic CT chest to compare. Previous PET-CT is a free breathing exam which obscure small pulmonary nodules. Attention on follow-up recommended. 3. Bilateral interstitial and patchy peripheral ground-glass opacity in both lungs, nonspecific but   potentially related to infectious/inflammatory process or scarring. 4. 10 mm enhancing lesion lower pole right kidney, suspicious for neoplasm. MRI without and with contrast recommended to further evaluate. 5. Small hiatal hernia. 6. Enlargement of the pulmonary outflow tract and main pulmonary arteries compatible with pulmonary arterial hypertension. Electronically Signed   By: Misty Stanley M.D.   On: 12/18/2019 11:26

## 2019-12-19 NOTE — Assessment & Plan Note (Signed)
Her blood pressure is profoundly elevated She has been started on diuretic therapy by her primary care doctor She will continue close monitoring of blood pressure and medication adjustment with her primary care doctor

## 2020-01-13 ENCOUNTER — Other Ambulatory Visit: Payer: Self-pay

## 2020-01-13 ENCOUNTER — Inpatient Hospital Stay: Payer: Medicare Other

## 2020-01-13 ENCOUNTER — Inpatient Hospital Stay: Payer: Medicare Other | Attending: Hematology and Oncology | Admitting: Hematology and Oncology

## 2020-01-13 VITALS — BP 179/90 | HR 61 | Temp 98.0°F | Resp 18 | Ht 62.0 in | Wt 156.4 lb

## 2020-01-13 DIAGNOSIS — C778 Secondary and unspecified malignant neoplasm of lymph nodes of multiple regions: Secondary | ICD-10-CM

## 2020-01-13 DIAGNOSIS — N289 Disorder of kidney and ureter, unspecified: Secondary | ICD-10-CM | POA: Diagnosis not present

## 2020-01-13 DIAGNOSIS — C55 Malignant neoplasm of uterus, part unspecified: Secondary | ICD-10-CM | POA: Insufficient documentation

## 2020-01-13 DIAGNOSIS — I119 Hypertensive heart disease without heart failure: Secondary | ICD-10-CM | POA: Diagnosis not present

## 2020-01-13 DIAGNOSIS — R918 Other nonspecific abnormal finding of lung field: Secondary | ICD-10-CM | POA: Insufficient documentation

## 2020-01-13 DIAGNOSIS — Z452 Encounter for adjustment and management of vascular access device: Secondary | ICD-10-CM | POA: Insufficient documentation

## 2020-01-13 DIAGNOSIS — G620 Drug-induced polyneuropathy: Secondary | ICD-10-CM | POA: Insufficient documentation

## 2020-01-13 DIAGNOSIS — I10 Essential (primary) hypertension: Secondary | ICD-10-CM

## 2020-01-13 DIAGNOSIS — E119 Type 2 diabetes mellitus without complications: Secondary | ICD-10-CM | POA: Diagnosis not present

## 2020-01-13 DIAGNOSIS — Z95828 Presence of other vascular implants and grafts: Secondary | ICD-10-CM | POA: Insufficient documentation

## 2020-01-13 DIAGNOSIS — D61818 Other pancytopenia: Secondary | ICD-10-CM | POA: Insufficient documentation

## 2020-01-13 DIAGNOSIS — T451X5A Adverse effect of antineoplastic and immunosuppressive drugs, initial encounter: Secondary | ICD-10-CM

## 2020-01-13 LAB — COMPREHENSIVE METABOLIC PANEL
ALT: 17 U/L (ref 0–44)
AST: 15 U/L (ref 15–41)
Albumin: 4.1 g/dL (ref 3.5–5.0)
Alkaline Phosphatase: 51 U/L (ref 38–126)
Anion gap: 7 (ref 5–15)
BUN: 17 mg/dL (ref 8–23)
CO2: 26 mmol/L (ref 22–32)
Calcium: 10.1 mg/dL (ref 8.9–10.3)
Chloride: 108 mmol/L (ref 98–111)
Creatinine, Ser: 0.79 mg/dL (ref 0.44–1.00)
GFR calc Af Amer: >60 mL/min (ref >60)
GFR calc non Af Amer: >60 mL/min (ref >60)
Glucose, Bld: 104 mg/dL — ABNORMAL HIGH (ref 70–99)
Potassium: 4.1 mmol/L (ref 3.5–5.1)
Sodium: 141 mmol/L (ref 135–145)
Total Bilirubin: 0.6 mg/dL (ref 0.3–1.2)
Total Protein: 6.7 g/dL (ref 6.5–8.1)

## 2020-01-13 LAB — CBC WITH DIFFERENTIAL/PLATELET
Abs Immature Granulocytes: 0.04 10*3/uL (ref 0.00–0.07)
Basophils Absolute: 0 10*3/uL (ref 0.0–0.1)
Basophils Relative: 1 %
Eosinophils Absolute: 0.1 10*3/uL (ref 0.0–0.5)
Eosinophils Relative: 2 %
HCT: 34.9 % — ABNORMAL LOW (ref 36.0–46.0)
Hemoglobin: 11.6 g/dL — ABNORMAL LOW (ref 12.0–15.0)
Immature Granulocytes: 1 %
Lymphocytes Relative: 27 %
Lymphs Abs: 1.2 10*3/uL (ref 0.7–4.0)
MCH: 32 pg (ref 26.0–34.0)
MCHC: 33.2 g/dL (ref 30.0–36.0)
MCV: 96.4 fL (ref 80.0–100.0)
Monocytes Absolute: 0.4 10*3/uL (ref 0.1–1.0)
Monocytes Relative: 10 %
Neutro Abs: 2.6 10*3/uL (ref 1.7–7.7)
Neutrophils Relative %: 59 %
Platelets: 203 10*3/uL (ref 150–400)
RBC: 3.62 MIL/uL — ABNORMAL LOW (ref 3.87–5.11)
RDW: 13.8 % (ref 11.5–15.5)
WBC: 4.4 10*3/uL (ref 4.0–10.5)
nRBC: 0 % (ref 0.0–0.2)

## 2020-01-13 MED ORDER — SODIUM CHLORIDE 0.9% FLUSH
10.0000 mL | INTRAVENOUS | Status: DC | PRN
  Administered 2020-01-13: 10 mL
  Filled 2020-01-13: qty 10

## 2020-01-13 MED ORDER — HEPARIN SOD (PORK) LOCK FLUSH 100 UNIT/ML IV SOLN
500.0000 [IU] | Freq: Once | INTRAVENOUS | Status: AC | PRN
  Administered 2020-01-13: 500 [IU]
  Filled 2020-01-13: qty 5

## 2020-01-13 MED ORDER — TERAZOSIN HCL 2 MG PO CAPS: 2.0000 mg | ORAL_CAPSULE | Freq: Every day | ORAL | 1 refills | Status: AC

## 2020-01-13 MED FILL — TERAZOSIN 2 MG CAPSULE: 2 | 30 days supply | Qty: 30 | Fill #0

## 2020-01-13 MED FILL — AMLODIPINE BESYLATE 5 MG TA: 5 | 30 days supply | Qty: 30 | Fill #1

## 2020-01-14 ENCOUNTER — Encounter: Payer: Self-pay | Admitting: Hematology and Oncology

## 2020-01-14 NOTE — Assessment & Plan Note (Signed)
Her diabetes control at home is excellent She will continue dietary adjustment and lifestyle modification

## 2020-01-14 NOTE — Assessment & Plan Note (Signed)
Overall, she is recovering well from recent side effects of therapy Due to high risk disease, I recommend repeat CT imaging in April

## 2020-01-14 NOTE — Assessment & Plan Note (Signed)
Overall, pancytopenia is improving She is not symptomatic

## 2020-01-14 NOTE — Assessment & Plan Note (Signed)
She denies significant residual neuropathy from prior treatment Observe for now

## 2020-01-14 NOTE — Assessment & Plan Note (Signed)
She is not symptomatic We will follow with CT imaging

## 2020-01-14 NOTE — Assessment & Plan Note (Signed)
She is not symptomatic and lung exam is clear We will order repeat CT imaging to follow

## 2020-01-14 NOTE — Progress Notes (Signed)
[place] OFFICE PROGRESS NOTE  Patient Care Team: [name], DO as PCP - General (Family Medicine)  ASSESSMENT & PLAN:  Uterine cancer ([place]) Overall, she is recovering well from recent side effects of therapy Due to high risk disease, I recommend repeat CT imaging in April   Pancytopenia, acquired ([place]) Overall, pancytopenia is improving She is not symptomatic  Peripheral neuropathy due to chemotherapy [facility]) She denies significant residual neuropathy from prior treatment Observe for now  Pulmonary infiltrates She is not symptomatic and lung exam is clear We will order repeat CT imaging to follow  Kidney lesion, native, left She is not symptomatic We will follow with CT imaging  Essential hypertension Her blood pressure control has improved at home She will continue close monitoring at home and medication adjustment through her primary care doctor  Diabetes mellitus without complication, without long-term current use of insulin ([place]) Her diabetes control at home is excellent She will continue dietary adjustment and lifestyle modification   Orders Placed This Encounter  Procedures  . CT ABDOMEN PELVIS W CONTRAST    Standing Status:   Future    Standing Expiration Date:   01/12/2021    Order Specific Question:   If indicated for the ordered procedure, I authorize the administration of contrast media per Radiology protocol    Answer:   Yes    Order Specific Question:   Preferred imaging location?    Answer:   [facility]    Order Specific Question:   Radiology Contrast Protocol - do NOT remove file path    Answer:   \\[email or web address]  . CT CHEST W CONTRAST    Standing Status:   Future    Standing Expiration Date:   01/12/2021    Order Specific Question:   If indicated for the ordered procedure, I authorize the administration of contrast media per Radiology protocol    Answer:   Yes    Order Specific  Question:   Preferred imaging location?    Answer:   [facility]    Order Specific Question:   Radiology Contrast Protocol - do NOT remove file path    Answer:   \\[email or web address]    All questions were answered. The patient knows to call the clinic with any problems, questions or concerns. The total time spent in the appointment was 20 minutes encounter with patients including review of chart and various tests results, discussions about plan of care and coordination of care plan   Heath Lark, MD 01/14/2020 9:24 AM  INTERVAL HISTORY: Please see below for problem oriented charting. She returns with her sister for further follow-up Overall, she is doing very well No recent infection, fever or chills No recent cough Her blood sugar control at home has improved recently She is walking more Denies abdominal pain, nausea or recent changes in bowel habits  SUMMARY OF ONCOLOGIC HISTORY: Oncology History Overview Note  Her2 neg PD-L neg Low mutation burden MSI stable NTRK neg   Uterine cancer ([place])  12/01/2017 Initial Diagnosis   She presented with postmenopausal bleeding   12/12/2017 Surgery   She had complete hysterectomy, BSO and bilateral pelvic sentinel lymph node excision   01/08/2018 Tumor Marker   Patient's tumor was tested for the following markers: CA-125 Results of the tumor marker test revealed 70.   01/18/2018 Imaging   CT scan of the chest, abdomen and pelvis elsewhere show lung nodules, kidney lesion as well as 1.5 x 1 cm left, iliac  lymph node   01/23/2018 - 06/12/2018 Chemotherapy   The patient had adjuvant chemotherapy with carboplatin and taxol elsewhere for chemotherapy treatment.     06/25/2018 Imaging   CT scan of the chest, abdomen and pelvis showed persistent lung nodules, kidney lesion but reduced size of previously noted pelvic lymphadenopathy to 6 x 4 mm   07/09/2018 - 08/17/2018 Radiation Therapy   She had daily radiation  treatment for 5 weeks elsewhere in [place], 28 days treatment, 5040 cGy   09/25/2018 Imaging   CT scan of the chest, abdomen and pelvis shows stable lung nodule and kidney lesion.  Previously noted enlarged lymphadenopathy in the external iliac region has resolved   01/16/2019 Imaging   CT imaging of the chest, abdomen and pelvis elsewhere showed stable kidney lesions but no evidence of cancer recurrence   05/16/2019 Imaging   Outside CT scan showed stable pulmonary nodules.  Bilateral simple renal cyst with an exophytic lesion in the medial cortex of the lower pole of the right kidney measure 10 mm with enhancement postcontrast.  There is 6 mm lymph node in the left para-aortic region.  There is an enlarging hyper enhancing aortocaval lymph node measuring 5 mm.   08/27/2019 PET scan   There is left supraclavicular lymph node measure 7 mm, another additional supraclavicular region 9 mm with SUV of 8.3.  There is abnormal uptake in retrocrural lymph nodes, SUV 3.7.  There is elevated FDG uptake in the patient's known retroperitoneal lymph nodes.  There is a small solid-appearing right inferior medial renal lesion with no FDG uptake.   09/13/2019 Procedure   She underwent biopsy of 2 left supraclavicular lymph node elsewhere   09/13/2019 Pathology Results   Immunohistochemical stains performed on the biopsy report showed that the tumor cells are strongly and diffusely positive for p16, PAX 8, p53, CK7, pancytokeratin and beta-catenin.  CA-125, CK20 and TTF-1 are negative.  Ki-67 stains approximately 50% of tumor cells.  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   10/09/2019 -  Chemotherapy   The patient had docetaxel and carboplatin for chemotherapy treatment elsewhere in [place].  Her treatment was initially given weekly.  Cycle 1 was complicated by neutropenia.  Cycle 2 was dose adjusted and delay.  Cycle 2 treatment was  complicated by loss of appetite, severe diarrhea and persistent neutropenia.  Cycle 3 was delay as well.  She went to the emergency department twice for IV fluid hydration and have lost a lot of weight.    12/18/2019 Imaging   CT chest, abdomen and pelvis 1. Interval resolution of the left supraclavicular, retroperitoneal, and right retrocrural lymphadenopathy seen on previous diagnostic CT and PET-CT. Tiny lymph nodes are seen in the retroperitoneal region today. 2. Tiny bilateral pulmonary nodules, indeterminate. These do not appear substantially changed although we do not have a prior diagnostic CT chest to compare. Previous PET-CT is a free breathing exam which obscure small pulmonary nodules. Attention on follow-up recommended. 3. Bilateral interstitial and patchy peripheral ground-glass opacity in both lungs, nonspecific but potentially related to infectious/inflammatory process or scarring. 4. 10 mm enhancing lesion lower pole right kidney, suspicious for neoplasm. MRI without and with contrast recommended to further evaluate. 5. Small hiatal hernia. 6. Enlargement of the pulmonary outflow tract and main pulmonary arteries compatible with pulmonary arterial hypertension.     REVIEW OF SYSTEMS:   Constitutional: Denies fevers, chills or abnormal weight loss Eyes: Denies blurriness of vision Ears,  nose, mouth, throat, and face: Denies mucositis or sore throat Respiratory: Denies cough, dyspnea or wheezes Cardiovascular: Denies palpitation, chest discomfort or lower extremity swelling Gastrointestinal:  Denies nausea, heartburn or change in bowel habits Skin: Denies abnormal skin rashes Lymphatics: Denies new lymphadenopathy or easy bruising Neurological:Denies numbness, tingling or new weaknesses Behavioral/Psych: Mood is stable, no new changes  All other systems were reviewed with the patient and are negative.  I have reviewed the past medical history, past surgical history, social  history and family history with the patient and they are unchanged from previous note.  ALLERGIES:  is allergic to verapamil.  MEDICATIONS:  Current Outpatient Medications  Medication Sig Dispense Refill  . amLODipine (NORVASC) 5 MG tablet Take 1 tablet (5 mg total) by mouth daily. 30 tablet 3  . aspirin EC 81 MG tablet Take 81 mg by mouth daily.    Marland Kitchen glipiZIDE (GLUCOTROL) 5 MG tablet Take 0.5 tablets by mouth 2 (two) times daily.    Marland Kitchen lidocaine-prilocaine (EMLA) cream Apply 1 application topically as needed.    Marland Kitchen losartan (COZAAR) 100 MG tablet Take 100 mg by mouth daily.    . metoprolol succinate (TOPROL-XL) 100 MG 24 hr tablet Take 100 mg by mouth daily. Take with or immediately following a meal.    . prochlorperazine (COMPAZINE) 10 MG tablet Take 10 mg by mouth every 6 (six) hours as needed.    . rosuvastatin (CRESTOR) 10 MG tablet Take 10 mg by mouth daily.    Marland Kitchen terazosin (HYTRIN) 2 MG capsule Take 1 capsule (2 mg total) by mouth at bedtime. 30 capsule 1   No current facility-administered medications for this visit.    PHYSICAL EXAMINATION: ECOG PERFORMANCE STATUS: 0 - Asymptomatic  Vitals:   01/13/20 1240  BP: (!) 179/90  Pulse: 61  Resp: 18  Temp: 98 F (36.7 C)  SpO2: 99%   Filed Weights   01/13/20 1240  Weight: 156 lb 6.4 oz (70.9 kg)    GENERAL:alert, no distress and comfortable SKIN: skin color, texture, turgor are normal, no rashes or significant lesions EYES: normal, Conjunctiva are pink and non-injected, sclera clear OROPHARYNX:no exudate, no erythema and lips, buccal mucosa, and tongue normal  NECK: supple, thyroid normal size, non-tender, without nodularity LYMPH:  no palpable lymphadenopathy in the cervical, axillary or inguinal LUNGS: clear to auscultation and percussion with normal breathing effort HEART: regular rate & rhythm and no murmurs and no lower extremity edema ABDOMEN:abdomen soft, non-tender and normal bowel sounds Musculoskeletal:no  cyanosis of digits and no clubbing  NEURO: alert & oriented x 3 with fluent speech, no focal motor/sensory deficits  LABORATORY DATA:  I have reviewed the data as listed    Component Value Date/Time   NA 141 01/13/2020 1219   K 4.1 01/13/2020 1219   CL 108 01/13/2020 1219   CO2 26 01/13/2020 1219   GLUCOSE 104 (H) 01/13/2020 1219   BUN 17 01/13/2020 1219   CREATININE 0.79 01/13/2020 1219   CALCIUM 10.1 01/13/2020 1219   PROT 6.7 01/13/2020 1219   ALBUMIN 4.1 01/13/2020 1219   AST 15 01/13/2020 1219   ALT 17 01/13/2020 1219   ALKPHOS 51 01/13/2020 1219   BILITOT 0.6 01/13/2020 1219   GFRNONAA >60 01/13/2020 1219   GFRAA >60 01/13/2020 1219    No results found for: SPEP, UPEP  Lab Results  Component Value Date   WBC 4.4 01/13/2020   NEUTROABS 2.6 01/13/2020   HGB 11.6 (L) 01/13/2020   HCT 34.9 (  L) 01/13/2020   MCV 96.4 01/13/2020   PLT 203 01/13/2020      Chemistry      Component Value Date/Time   NA 141 01/13/2020 1219   K 4.1 01/13/2020 1219   CL 108 01/13/2020 1219   CO2 26 01/13/2020 1219   BUN 17 01/13/2020 1219   CREATININE 0.79 01/13/2020 1219      Component Value Date/Time   CALCIUM 10.1 01/13/2020 1219   ALKPHOS 51 01/13/2020 1219   AST 15 01/13/2020 1219   ALT 17 01/13/2020 1219   BILITOT 0.6 01/13/2020 1219       RADIOGRAPHIC STUDIES: I have personally reviewed the radiological images as listed and agreed with the findings in the report. CT Chest W Contrast  Result Date: 12/18/2019 CLINICAL DATA:  Uterine carcinosarcoma. EXAM: CT CHEST, ABDOMEN, AND PELVIS WITH CONTRAST TECHNIQUE: Multidetector CT imaging of the chest, abdomen and pelvis was performed following the standard protocol during bolus administration of intravenous contrast. CONTRAST:  160m OMNIPAQUE IOHEXOL 300 MG/ML  SOLN COMPARISON:  [place]. report for this exam reviewed on EPIC Care Everywhere[facility] FINDINGS: CT  CHEST FINDINGS Cardiovascular: Heart is enlarged. No pericardial effusion. Coronary artery calcification is evident. Enlargement of the pulmonary outflow tract and main pulmonary arteries is compatible with pulmonary arterial hypertension. Right Port-A-Cath tip is positioned at the SVC/RA junction. Mediastinum/Nodes: No mediastinal lymphadenopathy. There is no hilar lymphadenopathy. Small hiatal hernia. The esophagus has normal imaging features. There is no axillary lymphadenopathy. Previous PET-CT report documented a 7 mm short axis left supraclavicular node that was hypermetabolic. This node has resolved in the interval. Lungs/Pleura: The central tracheobronchial airways are patent. There is bilateral interstitial and patchy peripheral ground-glass opacity in both lungs, nonspecific but potentially related to infectious/inflammatory process or scarring. 5 mm pulmonary nodule peripheral right lung base on 79/9 was about 3 mm on 08/14/2019. 4 mm pulmonary nodule in the medial right lower lobe (68/9) is not definitely seen on prior PET-CT. 4 mm left lower lobe subpleural nodule on 91/9 is similar to prior. Musculoskeletal: No worrisome lytic or sclerotic osseous abnormality. CT ABDOMEN PELVIS FINDINGS Hepatobiliary: No suspicious focal abnormality within the liver parenchyma. Nonvisualization of the gallbladder suggest prior cholecystectomy. No intrahepatic or extrahepatic biliary dilation. Pancreas: No focal mass lesion. No dilatation of the main duct. No intraparenchymal cyst. No peripancreatic edema. Spleen: No splenomegaly. No focal mass lesion. Adrenals/Urinary Tract: No adrenal nodule or mass. Multiple bilateral renal cysts are identified. 1.5 cm exophytic lesion lower pole left kidney has attenuation too high to be a simple cyst. 10 mm enhancing lesion lower pole right kidney (see coronal 88/7) is suspicious for neoplasm. No evidence for hydroureter. The urinary bladder appears normal for the degree of  distention. Stomach/Bowel: Small hiatal hernia. Stomach otherwise unremarkable. Duodenum is normally positioned as is the ligament of Treitz. No small bowel wall thickening. No small bowel dilatation. The terminal ileum is normal. The appendix is normal. No gross colonic mass. No colonic wall thickening. Diverticular changes are noted in the left colon without evidence of diverticulitis. Vascular/Lymphatic: There is abdominal aortic atherosclerosis without aneurysm. The small right retrocrural lymph node seen to be hypermetabolic on previous PET-CT have resolved in the interval. Patient also had small para-aortic lymph nodes on prior imaging that word noted to be hypermetabolic. These have decreased in the interval. Index 3 mm short axis left para-aortic node on 72/2 was 9 mm on the prior study. 3-4 mm short  axis aortocaval lymph node on 68/2 today was 8 mm when I remeasure in a similar fashion on the prior study. No pelvic sidewall lymphadenopathy. Reproductive: Uterus surgically absent.  There is no adnexal mass. Other: No intraperitoneal free fluid. Musculoskeletal: No worrisome lytic or sclerotic osseous abnormality. IMPRESSION: 1. Interval resolution of the left supraclavicular, retroperitoneal, and right retrocrural lymphadenopathy seen on previous diagnostic CT and PET-CT. Tiny lymph nodes are seen in the retroperitoneal region today. 2. Tiny bilateral pulmonary nodules, indeterminate. These do not appear substantially changed although we do not have a prior diagnostic CT chest to compare. Previous PET-CT is a free breathing exam which obscure small pulmonary nodules. Attention on follow-up recommended. 3. Bilateral interstitial and patchy peripheral ground-glass opacity in both lungs, nonspecific but potentially related to infectious/inflammatory process or scarring. 4. 10 mm enhancing lesion lower pole right kidney, suspicious for neoplasm. MRI without and with contrast recommended to further evaluate. 5.  Small hiatal hernia. 6. Enlargement of the pulmonary outflow tract and main pulmonary arteries compatible with pulmonary arterial hypertension. Electronically Signed   By: Misty Stanley M.D.   On: 12/18/2019 11:26   CT Abdomen Pelvis W Contrast  Result Date: 12/18/2019 CLINICAL DATA:  Uterine carcinosarcoma. EXAM: CT CHEST, ABDOMEN, AND PELVIS WITH CONTRAST TECHNIQUE: Multidetector CT imaging of the chest, abdomen and pelvis was performed following the standard protocol during bolus administration of intravenous contrast. CONTRAST:  153m OMNIPAQUE IOHEXOL 300 MG/ML  SOLN COMPARISON:  [place]. report for this exam reviewed on EPIC Care Everywhere. [facility] FINDINGS: CT CHEST FINDINGS Cardiovascular: Heart is enlarged. No pericardial effusion. Coronary artery calcification is evident. Enlargement of the pulmonary outflow tract and main pulmonary arteries is compatible with pulmonary arterial hypertension. Right Port-A-Cath tip is positioned at the SVC/RA junction. Mediastinum/Nodes: No mediastinal lymphadenopathy. There is no hilar lymphadenopathy. Small hiatal hernia. The esophagus has normal imaging features. There is no axillary lymphadenopathy. Previous PET-CT report documented a 7 mm short axis left supraclavicular node that was hypermetabolic. This node has resolved in the interval. Lungs/Pleura: The central tracheobronchial airways are patent. There is bilateral interstitial and patchy peripheral ground-glass opacity in both lungs, nonspecific but potentially related to infectious/inflammatory process or scarring. 5 mm pulmonary nodule peripheral right lung base on 79/9 was about 3 mm on 08/14/2019. 4 mm pulmonary nodule in the medial right lower lobe (68/9) is not definitely seen on prior PET-CT. 4 mm left lower lobe subpleural nodule on 91/9 is similar to prior. Musculoskeletal: No worrisome lytic or sclerotic osseous abnormality. CT ABDOMEN  PELVIS FINDINGS Hepatobiliary: No suspicious focal abnormality within the liver parenchyma. Nonvisualization of the gallbladder suggest prior cholecystectomy. No intrahepatic or extrahepatic biliary dilation. Pancreas: No focal mass lesion. No dilatation of the main duct. No intraparenchymal cyst. No peripancreatic edema. Spleen: No splenomegaly. No focal mass lesion. Adrenals/Urinary Tract: No adrenal nodule or mass. Multiple bilateral renal cysts are identified. 1.5 cm exophytic lesion lower pole left kidney has attenuation too high to be a simple cyst. 10 mm enhancing lesion lower pole right kidney (see coronal 88/7) is suspicious for neoplasm. No evidence for hydroureter. The urinary bladder appears normal for the degree of distention. Stomach/Bowel: Small hiatal hernia. Stomach otherwise unremarkable. Duodenum is normally positioned as is the ligament of Treitz. No small bowel wall thickening. No small bowel dilatation. The terminal ileum is normal. The appendix is normal. No gross colonic mass. No colonic wall thickening. Diverticular changes are noted in the left colon without  evidence of diverticulitis. Vascular/Lymphatic: There is abdominal aortic atherosclerosis without aneurysm. The small right retrocrural lymph node seen to be hypermetabolic on previous PET-CT have resolved in the interval. Patient also had small para-aortic lymph nodes on prior imaging that word noted to be hypermetabolic. These have decreased in the interval. Index 3 mm short axis left para-aortic node on 72/2 was 9 mm on the prior study. 3-4 mm short axis aortocaval lymph node on 68/2 today was 8 mm when I remeasure in a similar fashion on the prior study. No pelvic sidewall lymphadenopathy. Reproductive: Uterus surgically absent.  There is no adnexal mass. Other: No intraperitoneal free fluid. Musculoskeletal: No worrisome lytic or sclerotic osseous abnormality. IMPRESSION: 1. Interval resolution of the left supraclavicular,  retroperitoneal, and right retrocrural lymphadenopathy seen on previous diagnostic CT and PET-CT. Tiny lymph nodes are seen in the retroperitoneal region today. 2. Tiny bilateral pulmonary nodules, indeterminate. These do not appear substantially changed although we do not have a prior diagnostic CT chest to compare. Previous PET-CT is a free breathing exam which obscure small pulmonary nodules. Attention on follow-up recommended. 3. Bilateral interstitial and patchy peripheral ground-glass opacity in both lungs, nonspecific but potentially related to infectious/inflammatory process or scarring. 4. 10 mm enhancing lesion lower pole right kidney, suspicious for neoplasm. MRI without and with contrast recommended to further evaluate. 5. Small hiatal hernia. 6. Enlargement of the pulmonary outflow tract and main pulmonary arteries compatible with pulmonary arterial hypertension. Electronically Signed   By: Misty Stanley M.D.   On: 12/18/2019 11:26

## 2020-01-14 NOTE — Assessment & Plan Note (Signed)
Her blood pressure control has improved at home She will continue close monitoring at home and medication adjustment through her primary care doctor

## 2020-01-16 ENCOUNTER — Other Ambulatory Visit: Payer: Self-pay

## 2020-01-17 ENCOUNTER — Ambulatory Visit (INDEPENDENT_AMBULATORY_CARE_PROVIDER_SITE_OTHER): Payer: Medicare Other | Admitting: Family Medicine

## 2020-01-17 ENCOUNTER — Encounter: Payer: Self-pay | Admitting: Family Medicine

## 2020-01-17 ENCOUNTER — Telehealth: Payer: Self-pay | Admitting: Hematology and Oncology

## 2020-01-17 DIAGNOSIS — I10 Essential (primary) hypertension: Secondary | ICD-10-CM | POA: Diagnosis not present

## 2020-01-17 MED ORDER — AMLODIPINE BESYLATE 5 MG PO TABS: 5.0000 mg | ORAL_TABLET | Freq: Every day | ORAL | 1 refills | Status: DC

## 2020-01-17 NOTE — Patient Instructions (Addendum)
Keep an eye on your blood pressures. My goal for you is <150/90 on average.  OK to take amlodipine in the morning.  Keep the diet clean and stay active.  Please bring your blood pressure monitor to your next visit.  Let us know if you need anything.

## 2020-01-17 NOTE — Progress Notes (Signed)
Chief Complaint  Patient presents with  . Follow-up    hypertenison    Subjective Sandra Arroyo is a 69 y.o. female who presents for hypertension follow up. She does monitor home blood pressures. Blood pressures ranging from 140-150's/80-90's on average. She is compliant with medications- losartan 100 mg/d, Toprol XL 100 mg/d amlodipine 5 mg/d was recently added Patient has these side effects of medication: amlodipine makes her urinate more freq She is not always adhering to a healthy diet overall. Thinks this will get better if she is not eating her sister's cooking Current exercise: little since staying here w sister   Past Medical History:  Diagnosis Date  . Cancer ([place]) 2019  . Diabetes mellitus without complication ([place])   . Hyperparathyroidism, unspecified ([place])   . Hypertension     Review of Systems Cardiovascular: no chest pain Respiratory:  no shortness of breath  Exam BP (!) 144/74 (BP Location: Left Arm, Patient Position: Sitting, Cuff Size: Normal)   Temp (!) 96.8 F (36 C) (Temporal)   Ht 5' 2.5" (1.588 m)   Wt 156 lb (70.8 kg)   BMI 28.08 kg/m  General:  well developed, well nourished, in no apparent distress Heart: RRR, no bruits, no LE edema Lungs: clear to auscultation, no accessory muscle use Psych: well oriented with normal range of affect and appropriate judgment/insight  Essential hypertension - Plan: amLODipine (NORVASC) 5 MG tablet  I reck'd her BP, it was 146/74. She is at goal for her age. I think <150/90 is appropriate. Counseled on diet and exercise. F/u in 4 mo, bring BP monitor to that visit. The patient voiced understanding and agreement to the plan.  [place], DO 01/17/20  1:30 PM

## 2020-01-17 NOTE — Telephone Encounter (Signed)
Per 2/15 sch msg pt aware of dates and times

## 2020-01-28 ENCOUNTER — Other Ambulatory Visit: Payer: Self-pay | Admitting: *Deleted

## 2020-01-28 DIAGNOSIS — I10 Essential (primary) hypertension: Secondary | ICD-10-CM

## 2020-01-28 MED ORDER — AMLODIPINE BESYLATE 5 MG PO TABS: 5.0000 mg | ORAL_TABLET | Freq: Every day | ORAL | 1 refills | Status: DC

## 2020-03-06 ENCOUNTER — Other Ambulatory Visit: Payer: Self-pay | Admitting: Hematology and Oncology

## 2020-03-06 DIAGNOSIS — C55 Malignant neoplasm of uterus, part unspecified: Secondary | ICD-10-CM

## 2020-03-06 DIAGNOSIS — N289 Disorder of kidney and ureter, unspecified: Secondary | ICD-10-CM

## 2020-03-06 DIAGNOSIS — C778 Secondary and unspecified malignant neoplasm of lymph nodes of multiple regions: Secondary | ICD-10-CM

## 2020-03-09 ENCOUNTER — Inpatient Hospital Stay: Payer: Medicare Other

## 2020-03-09 ENCOUNTER — Other Ambulatory Visit: Payer: Self-pay | Admitting: *Deleted

## 2020-03-09 ENCOUNTER — Other Ambulatory Visit: Payer: Self-pay

## 2020-03-09 ENCOUNTER — Inpatient Hospital Stay: Payer: Medicare Other | Attending: Hematology and Oncology

## 2020-03-09 ENCOUNTER — Ambulatory Visit (HOSPITAL_COMMUNITY)
Admission: RE | Admit: 2020-03-09 | Discharge: 2020-03-09 | Disposition: A | Discharge status: Home / Self Care | Payer: Medicare Other | Source: Ambulatory Visit | Attending: Hematology and Oncology | Admitting: Hematology and Oncology

## 2020-03-09 DIAGNOSIS — C55 Malignant neoplasm of uterus, part unspecified: Secondary | ICD-10-CM | POA: Insufficient documentation

## 2020-03-09 DIAGNOSIS — C778 Secondary and unspecified malignant neoplasm of lymph nodes of multiple regions: Secondary | ICD-10-CM | POA: Diagnosis present

## 2020-03-09 DIAGNOSIS — Z95828 Presence of other vascular implants and grafts: Secondary | ICD-10-CM

## 2020-03-09 DIAGNOSIS — E119 Type 2 diabetes mellitus without complications: Secondary | ICD-10-CM | POA: Diagnosis not present

## 2020-03-09 DIAGNOSIS — Z452 Encounter for adjustment and management of vascular access device: Secondary | ICD-10-CM | POA: Diagnosis not present

## 2020-03-09 DIAGNOSIS — Z8542 Personal history of malignant neoplasm of other parts of uterus: Secondary | ICD-10-CM | POA: Insufficient documentation

## 2020-03-09 DIAGNOSIS — I10 Essential (primary) hypertension: Secondary | ICD-10-CM | POA: Insufficient documentation

## 2020-03-09 DIAGNOSIS — R918 Other nonspecific abnormal finding of lung field: Secondary | ICD-10-CM

## 2020-03-09 DIAGNOSIS — N289 Disorder of kidney and ureter, unspecified: Secondary | ICD-10-CM

## 2020-03-09 LAB — COMPREHENSIVE METABOLIC PANEL
ALT: 17 U/L (ref 0–44)
AST: 15 U/L (ref 15–41)
Albumin: 4.3 g/dL (ref 3.5–5.0)
Alkaline Phosphatase: 59 U/L (ref 38–126)
Anion gap: 11 (ref 5–15)
BUN: 17 mg/dL (ref 8–23)
CO2: 24 mmol/L (ref 22–32)
Calcium: 10.5 mg/dL — ABNORMAL HIGH (ref 8.9–10.3)
Chloride: 106 mmol/L (ref 98–111)
Creatinine, Ser: 0.8 mg/dL (ref 0.44–1.00)
GFR calc Af Amer: >60 mL/min (ref >60)
GFR calc non Af Amer: >60 mL/min (ref >60)
Glucose, Bld: 123 mg/dL — ABNORMAL HIGH (ref 70–99)
Potassium: 3.9 mmol/L (ref 3.5–5.1)
Sodium: 141 mmol/L (ref 135–145)
Total Bilirubin: 0.5 mg/dL (ref 0.3–1.2)
Total Protein: 6.8 g/dL (ref 6.5–8.1)

## 2020-03-09 LAB — CBC WITH DIFFERENTIAL/PLATELET
Abs Immature Granulocytes: 0.03 10*3/uL (ref 0.00–0.07)
Basophils Absolute: 0 10*3/uL (ref 0.0–0.1)
Basophils Relative: 1 %
Eosinophils Absolute: 0.1 10*3/uL (ref 0.0–0.5)
Eosinophils Relative: 2 %
HCT: 38.7 % (ref 36.0–46.0)
Hemoglobin: 12.9 g/dL (ref 12.0–15.0)
Immature Granulocytes: 1 %
Lymphocytes Relative: 33 %
Lymphs Abs: 1.3 10*3/uL (ref 0.7–4.0)
MCH: 31.2 pg (ref 26.0–34.0)
MCHC: 33.3 g/dL (ref 30.0–36.0)
MCV: 93.5 fL (ref 80.0–100.0)
Monocytes Absolute: 0.4 10*3/uL (ref 0.1–1.0)
Monocytes Relative: 9 %
Neutro Abs: 2.1 10*3/uL (ref 1.7–7.7)
Neutrophils Relative %: 54 %
Platelets: 177 10*3/uL (ref 150–400)
RBC: 4.14 MIL/uL (ref 3.87–5.11)
RDW: 12.1 % (ref 11.5–15.5)
WBC: 3.8 10*3/uL — ABNORMAL LOW (ref 4.0–10.5)
nRBC: 0 % (ref 0.0–0.2)

## 2020-03-09 MED ORDER — SODIUM CHLORIDE (PF) 0.9 % IJ SOLN
INTRAMUSCULAR | Status: AC
  Filled 2020-03-09: qty 50

## 2020-03-09 MED ORDER — HEPARIN SOD (PORK) LOCK FLUSH 100 UNIT/ML IV SOLN
500.0000 [IU] | Freq: Once | INTRAVENOUS | Status: AC | PRN
  Administered 2020-03-09: 500 [IU]
  Filled 2020-03-09: qty 5

## 2020-03-09 MED ORDER — ROSUVASTATIN CALCIUM 10 MG PO TABS: 10.0000 mg | ORAL_TABLET | Freq: Every day | ORAL | 1 refills | Status: DC

## 2020-03-09 MED ORDER — SODIUM CHLORIDE 0.9% FLUSH
10.0000 mL | INTRAVENOUS | Status: DC | PRN
  Administered 2020-03-09: 10 mL
  Filled 2020-03-09: qty 10

## 2020-03-09 MED ORDER — IOHEXOL 300 MG/ML  SOLN
100.0000 mL | Freq: Once | INTRAMUSCULAR | Status: AC | PRN
  Administered 2020-03-09: 100 mL via INTRAVENOUS

## 2020-03-10 ENCOUNTER — Inpatient Hospital Stay (HOSPITAL_BASED_OUTPATIENT_CLINIC_OR_DEPARTMENT_OTHER): Payer: Medicare Other | Admitting: Hematology and Oncology

## 2020-03-10 ENCOUNTER — Other Ambulatory Visit: Payer: Self-pay

## 2020-03-10 ENCOUNTER — Encounter: Payer: Self-pay | Admitting: Hematology and Oncology

## 2020-03-10 VITALS — BP 160/66 | HR 56 | Temp 98.7°F | Resp 18 | Ht 62.5 in | Wt 159.6 lb

## 2020-03-10 DIAGNOSIS — I10 Essential (primary) hypertension: Secondary | ICD-10-CM | POA: Diagnosis not present

## 2020-03-10 DIAGNOSIS — C55 Malignant neoplasm of uterus, part unspecified: Secondary | ICD-10-CM | POA: Diagnosis not present

## 2020-03-10 DIAGNOSIS — Z8542 Personal history of malignant neoplasm of other parts of uterus: Secondary | ICD-10-CM | POA: Diagnosis not present

## 2020-03-10 DIAGNOSIS — N289 Disorder of kidney and ureter, unspecified: Secondary | ICD-10-CM

## 2020-03-10 DIAGNOSIS — E119 Type 2 diabetes mellitus without complications: Secondary | ICD-10-CM

## 2020-03-10 MED ORDER — ROSUVASTATIN CALCIUM 10 MG PO TABS: 10.0000 mg | ORAL_TABLET | Freq: Every day | ORAL | 1 refills | Status: DC

## 2020-03-10 MED FILL — ROSUVASTATIN CALCIUM 10 MG: 10 | 30 days supply | Qty: 30 | Fill #0

## 2020-03-10 NOTE — Assessment & Plan Note (Signed)
Her blood sugar control at home is satisfactory She will continue the same

## 2020-03-10 NOTE — Assessment & Plan Note (Signed)
Her blood pressure control has improved at home She will continue close monitoring at home and medication adjustment through her primary care doctor

## 2020-03-10 NOTE — Assessment & Plan Note (Signed)
The kidney lesion is stable We will continue serial imaging study

## 2020-03-10 NOTE — Progress Notes (Signed)
[place] OFFICE PROGRESS NOTE  Patient Care Team: [name], DO as PCP - General (Family Medicine)  ASSESSMENT & PLAN:  Uterine cancer [facility]) She has no evidence of recurrence of disease However, her risk remains high I recommend port flush in about 6 weeks and see her back in about 3 months with repeat blood work and imaging studies She agreed with the plan of care  Essential hypertension Her blood pressure control has improved at home She will continue close monitoring at home and medication adjustment through her primary care doctor  Diabetes mellitus without complication, without long-term current use of insulin ([place]) Her blood sugar control at home is satisfactory She will continue the same  Kidney lesion, native, left The kidney lesion is stable We will continue serial imaging study   Orders Placed This Encounter  Procedures  . CT ABDOMEN PELVIS W CONTRAST    Standing Status:   Future    Standing Expiration Date:   03/10/2021    Order Specific Question:   If indicated for the ordered procedure, I authorize the administration of contrast media per Radiology protocol    Answer:   Yes    Order Specific Question:   Preferred imaging location?    Answer:   [facility]    Order Specific Question:   Radiology Contrast Protocol - do NOT remove file path    Answer:   [email or web address]    All questions were answered. The patient knows to call the clinic with any problems, questions or concerns. The total time spent in the appointment was 20 minutes encounter with patients including review of chart and various tests results, discussions about plan of care and coordination of care plan   Heath Lark, MD 03/10/2020 11:41 AM  INTERVAL HISTORY: Please see below for problem oriented charting. She returns for follow-up She is doing well She has started some exercise through a personal trainer Her appetite is  fair No residual side effects from previous treatment  SUMMARY OF ONCOLOGIC HISTORY: Oncology History Overview Note  Her2 neg PD-L neg Low mutation burden MSI stable NTRK neg   Uterine cancer ([place])  12/01/2017 Initial Diagnosis   She presented with postmenopausal bleeding   12/12/2017 Surgery   She had complete hysterectomy, BSO and bilateral pelvic sentinel lymph node excision   01/08/2018 Tumor Marker   Patient's tumor was tested for the following markers: CA-125 Results of the tumor marker test revealed 70.   01/18/2018 Imaging   CT scan of the chest, abdomen and pelvis elsewhere show lung nodules, kidney lesion as well as 1.5 x 1 cm left, iliac lymph node   01/23/2018 - 06/12/2018 Chemotherapy   The patient had adjuvant chemotherapy with carboplatin and taxol elsewhere for chemotherapy treatment.     06/25/2018 Imaging   CT scan of the chest, abdomen and pelvis showed persistent lung nodules, kidney lesion but reduced size of previously noted pelvic lymphadenopathy to 6 x 4 mm   07/09/2018 - 08/17/2018 Radiation Therapy   She had daily radiation treatment for 5 weeks elsewhere in [place], 28 days treatment, 5040 cGy   09/25/2018 Imaging   CT scan of the chest, abdomen and pelvis shows stable lung nodule and kidney lesion.  Previously noted enlarged lymphadenopathy in the external iliac region has resolved   01/16/2019 Imaging   CT imaging of the chest, abdomen and pelvis elsewhere showed stable kidney lesions but no evidence of cancer recurrence   05/16/2019 Imaging   Outside  CT scan showed stable pulmonary nodules.  Bilateral simple renal cyst with an exophytic lesion in the medial cortex of the lower pole of the right kidney measure 10 mm with enhancement postcontrast.  There is 6 mm lymph node in the left para-aortic region.  There is an enlarging hyper enhancing aortocaval lymph node measuring 5 mm.   08/27/2019 PET scan   There is left supraclavicular lymph node measure 7  mm, another additional supraclavicular region 9 mm with SUV of 8.3.  There is abnormal uptake in retrocrural lymph nodes, SUV 3.7.  There is elevated FDG uptake in the patient's known retroperitoneal lymph nodes.  There is a small solid-appearing right inferior medial renal lesion with no FDG uptake.   09/13/2019 Procedure   She underwent biopsy of 2 left supraclavicular lymph node elsewhere   09/13/2019 Pathology Results   Immunohistochemical stains performed on the biopsy report showed that the tumor cells are strongly and diffusely positive for p16, PAX 8, p53, CK7, pancytokeratin and beta-catenin.  CA-125, CK20 and TTF-1 are negative.  Ki-67 stains approximately 50% of tumor cells.  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   10/09/2019 -  Chemotherapy   The patient had docetaxel and carboplatin for chemotherapy treatment elsewhere in [place].  Her treatment was initially given weekly.  Cycle 1 was complicated by neutropenia.  Cycle 2 was dose adjusted and delay.  Cycle 2 treatment was complicated by loss of appetite, severe diarrhea and persistent neutropenia.  Cycle 3 was delay as well.  She went to the emergency department twice for IV fluid hydration and have lost a lot of weight.    12/18/2019 Imaging   CT chest, abdomen and pelvis 1. Interval resolution of the left supraclavicular, retroperitoneal, and right retrocrural lymphadenopathy seen on previous diagnostic CT and PET-CT. Tiny lymph nodes are seen in the retroperitoneal region today. 2. Tiny bilateral pulmonary nodules, indeterminate. These do not appear substantially changed although we do not have a prior diagnostic CT chest to compare. Previous PET-CT is a free breathing exam which obscure small pulmonary nodules. Attention on follow-up recommended. 3. Bilateral interstitial and patchy peripheral ground-glass opacity in both lungs, nonspecific but potentially  related to infectious/inflammatory process or scarring. 4. 10 mm enhancing lesion lower pole right kidney, suspicious for neoplasm. MRI without and with contrast recommended to further evaluate. 5. Small hiatal hernia. 6. Enlargement of the pulmonary outflow tract and main pulmonary arteries compatible with pulmonary arterial hypertension.   03/09/2020 Imaging   1. No findings suspicious for metastatic disease in the chest, abdomen or pelvis. 2. Small pulmonary nodules are all stable and warrant continued chest CT follow-up. 3. Stable indeterminate exophytic 1.2 cm renal cortical lesion in the lateral lower left kidney, renal cell carcinoma not excluded. Dedicated MRI abdomen without and with IV contrast may be obtained for further evaluation as clinically warranted. 4. Chronic findings include: Stable dilated main pulmonary artery, suggesting pulmonary arterial hypertension. Small hiatal hernia. Mild left colonic diverticulosis. Two-vessel coronary atherosclerosis. Aortic Atherosclerosis (ICD10-I70.0).       REVIEW OF SYSTEMS:   Constitutional: Denies fevers, chills or abnormal weight loss Eyes: Denies blurriness of vision Ears, nose, mouth, throat, and face: Denies mucositis or sore throat Respiratory: Denies cough, dyspnea or wheezes Cardiovascular: Denies palpitation, chest discomfort or lower extremity swelling Gastrointestinal:  Denies nausea, heartburn or change in bowel habits Skin: Denies abnormal skin rashes Lymphatics: Denies new lymphadenopathy or easy bruising Neurological:Denies numbness, tingling or  new weaknesses Behavioral/Psych: Mood is stable, no new changes  All other systems were reviewed with the patient and are negative.  I have reviewed the past medical history, past surgical history, social history and family history with the patient and they are unchanged from previous note.  ALLERGIES:  is allergic to verapamil.  MEDICATIONS:  Current Outpatient Medications   Medication Sig Dispense Refill  . amLODipine (NORVASC) 5 MG tablet Take 1 tablet (5 mg total) by mouth daily. 90 tablet 1  . aspirin EC 81 MG tablet Take 81 mg by mouth daily.    Marland Kitchen glipiZIDE (GLUCOTROL) 5 MG tablet Take 0.5 tablets by mouth 2 (two) times daily.    Marland Kitchen lidocaine-prilocaine (EMLA) cream Apply 1 application topically as needed.    Marland Kitchen losartan (COZAAR) 100 MG tablet Take 100 mg by mouth daily.    . metoprolol succinate (TOPROL-XL) 100 MG 24 hr tablet Take 100 mg by mouth daily. Take with or immediately following a meal.    . prochlorperazine (COMPAZINE) 10 MG tablet Take 10 mg by mouth every 6 (six) hours as needed.    . rosuvastatin (CRESTOR) 10 MG tablet Take 1 tablet (10 mg total) by mouth daily. 30 tablet 1  . terazosin (HYTRIN) 2 MG capsule Take 1 capsule (2 mg total) by mouth at bedtime. 30 capsule 1   No current facility-administered medications for this visit.    PHYSICAL EXAMINATION: ECOG PERFORMANCE STATUS: 0 - Asymptomatic  Vitals:   03/10/20 1048  BP: (!) 160/66  Pulse: (!) 56  Resp: 18  Temp: 98.7 F (37.1 C)  SpO2: 100%   Filed Weights   03/10/20 1048  Weight: 159 lb 9.6 oz (72.4 kg)    GENERAL:alert, no distress and comfortable  LABORATORY DATA:  I have reviewed the data as listed    Component Value Date/Time   NA 141 03/09/2020 0946   K 3.9 03/09/2020 0946   CL 106 03/09/2020 0946   CO2 24 03/09/2020 0946   GLUCOSE 123 (H) 03/09/2020 0946   BUN 17 03/09/2020 0946   CREATININE 0.80 03/09/2020 0946   CALCIUM 10.5 (H) 03/09/2020 0946   PROT 6.8 03/09/2020 0946   ALBUMIN 4.3 03/09/2020 0946   AST 15 03/09/2020 0946   ALT 17 03/09/2020 0946   ALKPHOS 59 03/09/2020 0946   BILITOT 0.5 03/09/2020 0946   GFRNONAA >60 03/09/2020 0946   GFRAA >60 03/09/2020 0946    No results found for: SPEP, UPEP  Lab Results  Component Value Date   WBC 3.8 (L) 03/09/2020   NEUTROABS 2.1 03/09/2020   HGB 12.9 03/09/2020   HCT 38.7 03/09/2020   MCV 93.5  03/09/2020   PLT 177 03/09/2020      Chemistry      Component Value Date/Time   NA 141 03/09/2020 0946   K 3.9 03/09/2020 0946   CL 106 03/09/2020 0946   CO2 24 03/09/2020 0946   BUN 17 03/09/2020 0946   CREATININE 0.80 03/09/2020 0946      Component Value Date/Time   CALCIUM 10.5 (H) 03/09/2020 0946   ALKPHOS 59 03/09/2020 0946   AST 15 03/09/2020 0946   ALT 17 03/09/2020 0946   BILITOT 0.5 03/09/2020 0946       RADIOGRAPHIC STUDIES: I have reviewed multiple imaging studies with the patient and her sister I have personally reviewed the radiological images as listed and agreed with the findings in the report. 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 Restaging. EXAM: CT CHEST, ABDOMEN, AND PELVIS WITH CONTRAST TECHNIQUE: Multidetector CT imaging of the chest, abdomen and pelvis was performed following the standard protocol during bolus administration of intravenous contrast. CONTRAST:  157m OMNIPAQUE IOHEXOL 300 MG/ML  SOLN COMPARISON:  12/18/2019 CT chest, abdomen and pelvis. FINDINGS: CT CHEST FINDINGS Cardiovascular: Top-normal heart size. No significant pericardial effusion/thickening. Left anterior descending and right coronary atherosclerosis. Right internal jugular Port-A-Cath terminates in the lower third of the SVC. Atherosclerotic nonaneurysmal thoracic aorta. Stable dilated main pulmonary artery (3.7 cm diameter). No central pulmonary emboli. Mediastinum/Nodes: Stable heterogeneous nonenlarged thyroid gland. This has been documented on prior studies (ref: J Am Coll Radiol. 2015 Feb;12(2): 143-50). Unremarkable esophagus. No pathologically enlarged axillary, mediastinal or hilar lymph nodes. Lungs/Pleura: No pneumothorax. No pleural effusion. No acute consolidative airspace disease or lung masses. Several scattered small solid pulmonary  nodules in the right greater than left lungs, largest 3 mm in the peripheral right middle lobe (series 4/image 99), all stable. No new significant pulmonary nodules. Musculoskeletal: No aggressive appearing focal osseous lesions. Mild thoracic spondylosis. CT ABDOMEN PELVIS FINDINGS Hepatobiliary: Normal liver with no liver mass. Gallbladder not visualized, presumably surgically absent. No biliary ductal dilatation. Pancreas: Normal, with no mass or duct dilation. Spleen: Normal size. No mass. Adrenals/Urinary Tract: Normal adrenals. No hydronephrosis. Hypodense exophytic 1.2 cm renal cortical lesion in the lateral lower left kidney (series 7/image 25), stable. Several simple renal cortical cysts scattered in both kidneys, largest 3.2 cm in the anterior interpolar left kidney. Additional subcentimeter hypodense renal cortical lesions in both kidneys are too small to characterize and require no follow-up. Normal bladder. Stomach/Bowel: Small hiatal hernia. Otherwise normal nondistended stomach. Normal caliber small bowel with no small bowel wall thickening. Normal appendix. Mild left colonic diverticulosis with no large bowel wall thickening or acute pericolonic fat stranding. Vascular/Lymphatic: Atherosclerotic nonaneurysmal abdominal aorta. Patent portal, splenic, hepatic and renal veins. No pathologically enlarged lymph nodes in the abdomen or pelvis. Reproductive: Status post hysterectomy, with no abnormal findings at the vaginal cuff. No adnexal mass. Other: No pneumoperitoneum, ascites or focal fluid collection. Musculoskeletal: No aggressive appearing focal osseous lesions. Moderate lower lumbar spondylosis. IMPRESSION: 1. No findings suspicious for metastatic disease in the chest, abdomen or pelvis. 2. Small pulmonary nodules are all stable and warrant continued chest CT follow-up. 3. Stable indeterminate exophytic 1.2 cm renal cortical lesion in the lateral lower left kidney, renal cell carcinoma not  excluded. Dedicated MRI abdomen without and with IV contrast may be obtained for further evaluation as clinically warranted. 4. Chronic findings include: Stable dilated main pulmonary artery, suggesting pulmonary arterial hypertension. Small hiatal hernia. Mild left colonic diverticulosis. Two-vessel coronary atherosclerosis. Aortic Atherosclerosis (ICD10-I70.0). Electronically Signed   By: JIlona SorrelM.D.   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 Restaging. EXAM: CT CHEST, ABDOMEN, AND PELVIS WITH CONTRAST TECHNIQUE: Multidetector CT imaging of the chest, abdomen and pelvis was performed following the standard protocol during bolus administration of intravenous contrast. CONTRAST:  1055mOMNIPAQUE IOHEXOL 300 MG/ML  SOLN COMPARISON:  12/18/2019 CT chest, abdomen and pelvis. FINDINGS: CT CHEST FINDINGS Cardiovascular: Top-normal heart size. No significant pericardial effusion/thickening. Left anterior descending and right coronary atherosclerosis. Right internal jugular Port-A-Cath terminates in the lower third of the SVC. Atherosclerotic nonaneurysmal thoracic aorta.  Stable dilated main pulmonary artery (3.7 cm diameter). No central pulmonary emboli. Mediastinum/Nodes: Stable heterogeneous nonenlarged thyroid gland. This has been documented on prior studies (ref: J Am Coll Radiol. 2015 Feb;12(2): 143-50). Unremarkable esophagus. No pathologically enlarged axillary, mediastinal or hilar lymph nodes. Lungs/Pleura: No pneumothorax. No pleural effusion. No acute consolidative airspace disease or lung masses. Several scattered small solid pulmonary nodules in the right greater than left lungs, largest 3 mm in the peripheral right middle lobe (series 4/image 99), all stable. No new significant pulmonary nodules. Musculoskeletal: No  aggressive appearing focal osseous lesions. Mild thoracic spondylosis. CT ABDOMEN PELVIS FINDINGS Hepatobiliary: Normal liver with no liver mass. Gallbladder not visualized, presumably surgically absent. No biliary ductal dilatation. Pancreas: Normal, with no mass or duct dilation. Spleen: Normal size. No mass. Adrenals/Urinary Tract: Normal adrenals. No hydronephrosis. Hypodense exophytic 1.2 cm renal cortical lesion in the lateral lower left kidney (series 7/image 25), stable. Several simple renal cortical cysts scattered in both kidneys, largest 3.2 cm in the anterior interpolar left kidney. Additional subcentimeter hypodense renal cortical lesions in both kidneys are too small to characterize and require no follow-up. Normal bladder. Stomach/Bowel: Small hiatal hernia. Otherwise normal nondistended stomach. Normal caliber small bowel with no small bowel wall thickening. Normal appendix. Mild left colonic diverticulosis with no large bowel wall thickening or acute pericolonic fat stranding. Vascular/Lymphatic: Atherosclerotic nonaneurysmal abdominal aorta. Patent portal, splenic, hepatic and renal veins. No pathologically enlarged lymph nodes in the abdomen or pelvis. Reproductive: Status post hysterectomy, with no abnormal findings at the vaginal cuff. No adnexal mass. Other: No pneumoperitoneum, ascites or focal fluid collection. Musculoskeletal: No aggressive appearing focal osseous lesions. Moderate lower lumbar spondylosis. IMPRESSION: 1. No findings suspicious for metastatic disease in the chest, abdomen or pelvis. 2. Small pulmonary nodules are all stable and warrant continued chest CT follow-up. 3. Stable indeterminate exophytic 1.2 cm renal cortical lesion in the lateral lower left kidney, renal cell carcinoma not excluded. Dedicated MRI abdomen without and with IV contrast may be obtained for further evaluation as clinically warranted. 4. Chronic findings include: Stable dilated main pulmonary artery,  suggesting pulmonary arterial hypertension. Small hiatal hernia. Mild left colonic diverticulosis. Two-vessel coronary atherosclerosis. Aortic Atherosclerosis (ICD10-I70.0). Electronically Signed   By: Ilona Sorrel M.D.   On: 03/09/2020 12:57

## 2020-03-10 NOTE — Assessment & Plan Note (Signed)
She has no evidence of recurrence of disease However, her risk remains high I recommend port flush in about 6 weeks and see her back in about 3 months with repeat blood work and imaging studies She agreed with the plan of care

## 2020-03-11 ENCOUNTER — Telehealth: Payer: Self-pay | Admitting: Hematology and Oncology

## 2020-03-11 NOTE — Telephone Encounter (Signed)
Scheduled appts per 4/13 sch msg. Pt confirmed appt date and time.

## 2020-04-02 ENCOUNTER — Telehealth: Payer: Self-pay | Admitting: Family Medicine

## 2020-04-02 DIAGNOSIS — Z1231 Encounter for screening mammogram for malignant neoplasm of breast: Secondary | ICD-10-CM

## 2020-04-02 NOTE — Telephone Encounter (Signed)
Mammogram placed for [facility] location

## 2020-04-02 NOTE — Telephone Encounter (Signed)
Caller: Sandra Arroyo  Call Back # [phone]  Patient is requesting an order for routine mammogram.   Please Advise

## 2020-04-21 ENCOUNTER — Inpatient Hospital Stay: Payer: Medicare Other | Attending: Hematology and Oncology

## 2020-04-21 ENCOUNTER — Other Ambulatory Visit: Payer: Self-pay

## 2020-04-21 DIAGNOSIS — Z95828 Presence of other vascular implants and grafts: Secondary | ICD-10-CM

## 2020-04-21 DIAGNOSIS — Z8542 Personal history of malignant neoplasm of other parts of uterus: Secondary | ICD-10-CM | POA: Diagnosis not present

## 2020-04-21 DIAGNOSIS — Z452 Encounter for adjustment and management of vascular access device: Secondary | ICD-10-CM | POA: Insufficient documentation

## 2020-04-21 MED ORDER — HEPARIN SOD (PORK) LOCK FLUSH 100 UNIT/ML IV SOLN
500.0000 [IU] | Freq: Once | INTRAVENOUS | Status: AC | PRN
  Administered 2020-04-21: 500 [IU]
  Filled 2020-04-21: qty 5

## 2020-04-21 MED ORDER — SODIUM CHLORIDE 0.9% FLUSH
10.0000 mL | INTRAVENOUS | Status: DC | PRN
  Administered 2020-04-21: 10 mL
  Filled 2020-04-21: qty 10

## 2020-05-12 ENCOUNTER — Ambulatory Visit (INDEPENDENT_AMBULATORY_CARE_PROVIDER_SITE_OTHER): Payer: Medicare Other | Admitting: Family Medicine

## 2020-05-12 ENCOUNTER — Other Ambulatory Visit: Payer: Self-pay

## 2020-05-12 ENCOUNTER — Encounter: Payer: Self-pay | Admitting: Family Medicine

## 2020-05-12 VITALS — BP 140/80 | HR 65 | Temp 96.0°F | Ht 62.0 in | Wt 162.4 lb

## 2020-05-12 DIAGNOSIS — E1165 Type 2 diabetes mellitus with hyperglycemia: Secondary | ICD-10-CM

## 2020-05-12 DIAGNOSIS — Z1159 Encounter for screening for other viral diseases: Secondary | ICD-10-CM

## 2020-05-12 DIAGNOSIS — I10 Essential (primary) hypertension: Secondary | ICD-10-CM

## 2020-05-12 DIAGNOSIS — Z23 Encounter for immunization: Secondary | ICD-10-CM

## 2020-05-12 LAB — COMPREHENSIVE METABOLIC PANEL
ALT: 15 U/L (ref 0–35)
AST: 17 U/L (ref 0–37)
Albumin: 4.5 g/dL (ref 3.5–5.2)
Alkaline Phosphatase: 56 U/L (ref 39–117)
BUN: 19 mg/dL (ref 6–23)
CO2: 29 mEq/L (ref 19–32)
Calcium: 10.6 mg/dL — ABNORMAL HIGH (ref 8.4–10.5)
Chloride: 108 mEq/L (ref 96–112)
Creatinine, Ser: 0.85 mg/dL (ref 0.40–1.20)
GFR: 65.92 mL/min (ref >60.00)
Glucose, Bld: 119 mg/dL — ABNORMAL HIGH (ref 70–99)
Potassium: 4.7 mEq/L (ref 3.5–5.1)
Sodium: 140 mEq/L (ref 135–145)
Total Bilirubin: 0.7 mg/dL (ref 0.2–1.2)
Total Protein: 6.3 g/dL (ref 6.0–8.3)

## 2020-05-12 LAB — MICROALBUMIN / CREATININE URINE RATIO
Creatinine,U: 149.9 mg/dL
Microalb Creat Ratio: 1.6 mg/g (ref 0.0–30.0)
Microalb, Ur: 2.5 mg/dL — ABNORMAL HIGH (ref 0.0–1.9)

## 2020-05-12 LAB — HEMOGLOBIN A1C: Hgb A1c MFr Bld: 7 % — ABNORMAL HIGH (ref 4.6–6.5)

## 2020-05-12 MED ORDER — AMLODIPINE BESYLATE 5 MG PO TABS: 5.0000 mg | ORAL_TABLET | Freq: Every day | ORAL | 2 refills | Status: DC

## 2020-05-12 MED ORDER — ROSUVASTATIN CALCIUM 10 MG PO TABS: 10.0000 mg | ORAL_TABLET | Freq: Every day | ORAL | 2 refills | Status: AC

## 2020-05-12 NOTE — Patient Instructions (Addendum)
Keep the diet clean and stay active.  Give [place] 2-3 business days to get the results of your labs back.   The new Shingrix vaccine (for shingles) is a 2 shot series. It can make people feel low energy, achy and almost like they have the flu for 48 hours after injection. Please plan accordingly when deciding on when to get this shot. Call our office for a nurse visit appointment to get this. The second shot of the series is less severe regarding the side effects, but it still lasts 48 hours.   Because your blood pressure is well-controlled, you no longer have to check your blood pressure at home anymore unless you wish. Some people check it twice daily every day and some people stop altogether. Either or anything in between is fine. Strong work!  Your homework is to see your eye provider. If you need a referral, let me know.   Let us know if you need anything.

## 2020-05-12 NOTE — Progress Notes (Signed)
Chief Complaint  Patient presents with  . Follow-up    Subjective Sandra Arroyo is a 69 y.o. female who presents for hypertension follow up. She does monitor home blood pressures. Blood pressures ranging from 130-140's/80's on average. She is compliant with medications- Norvasc 5 mg/d, Toprol 100 mg/d, losartan 100 mg/d. Patient has these side effects of medication: none She is usually adhering to a healthy diet overall. Current exercise: wt resistance exercise, cardio  DM Last A1c 5.8. Sugars running in low 100's. Compliant with meds. No lows. Compliant with Glucotrol 2.5 mg bid. No AE's. Diet/exercise as above. On statin.    Past Medical History:  Diagnosis Date  . Cancer ([place]) 2019  . Diabetes mellitus without complication ([place])   . Hyperparathyroidism, unspecified ([place])   . Hypertension     Exam BP 140/80 (BP Location: Left Arm, Patient Position: Sitting, Cuff Size: Normal)   Pulse 65   Temp (!) 96 F (35.6 C) (Temporal)   Ht 5\' 2"  (1.575 m)   Wt 162 lb 6 oz (73.7 kg)   SpO2 95%   BMI 29.70 kg/m  General:  well developed, well nourished, in no apparent distress Heart: RRR, no bruits, no LE edema Lungs: clear to auscultation, no accessory muscle use Neuro: Sensation intact to pinprick b/l Skin: No lesions on feet Psych: well oriented with normal range of affect and appropriate judgment/insight  Essential hypertension - Plan: amLODipine (NORVASC) 5 MG tablet  Encounter for hepatitis C screening test for low risk patient  Type 2 diabetes mellitus with hyperglycemia, without long-term current use of insulin ([facility]) - Plan: Hemoglobin A1c, Comprehensive metabolic panel, Hepatitis C antibody, Microalbumin / creatinine urine ratio  Need for vaccination against Streptococcus pneumoniae - Plan: Pneumococcal polysaccharide vaccine 23-valent greater than or equal to 2yo subcutaneous/IM  1- Cont Norvasc. Counseled on diet and exercise. OK to stop ck'ing BP at home. 2-  Screen. 3- Ck labs. Update PCV23.  F/u in 6 mo or prn. The patient voiced understanding and agreement to the plan.  [place], DO 05/12/20  12:29 PM

## 2020-05-12 NOTE — Addendum Note (Signed)
Addended by: Ames Coupe on: 05/12/2020 12:30 PM   Modules accepted: Level of Service

## 2020-05-13 ENCOUNTER — Ambulatory Visit
Admission: RE | Admit: 2020-05-13 | Discharge: 2020-05-13 | Disposition: A | Discharge status: Home / Self Care | Payer: Medicare Other | Source: Ambulatory Visit | Attending: Family Medicine | Admitting: Family Medicine

## 2020-05-13 DIAGNOSIS — Z1231 Encounter for screening mammogram for malignant neoplasm of breast: Secondary | ICD-10-CM

## 2020-05-13 LAB — HEPATITIS C ANTIBODY
Hepatitis C Ab: NONREACTIVE
SIGNAL TO CUT-OFF: 0.01 (ref <1.00)

## 2020-05-14 ENCOUNTER — Other Ambulatory Visit: Payer: Self-pay | Admitting: Family Medicine

## 2020-06-04 ENCOUNTER — Ambulatory Visit (HOSPITAL_COMMUNITY)
Admission: RE | Admit: 2020-06-04 | Discharge: 2020-06-04 | Disposition: A | Discharge status: Home / Self Care | Payer: Medicare Other | Source: Ambulatory Visit | Attending: Hematology and Oncology | Admitting: Hematology and Oncology

## 2020-06-04 ENCOUNTER — Inpatient Hospital Stay: Payer: Medicare Other | Attending: Hematology and Oncology

## 2020-06-04 ENCOUNTER — Other Ambulatory Visit: Payer: Self-pay

## 2020-06-04 ENCOUNTER — Inpatient Hospital Stay: Payer: Medicare Other

## 2020-06-04 DIAGNOSIS — C55 Malignant neoplasm of uterus, part unspecified: Secondary | ICD-10-CM | POA: Insufficient documentation

## 2020-06-04 DIAGNOSIS — R918 Other nonspecific abnormal finding of lung field: Secondary | ICD-10-CM | POA: Insufficient documentation

## 2020-06-04 DIAGNOSIS — I700 Atherosclerosis of aorta: Secondary | ICD-10-CM | POA: Insufficient documentation

## 2020-06-04 DIAGNOSIS — Z95828 Presence of other vascular implants and grafts: Secondary | ICD-10-CM

## 2020-06-04 DIAGNOSIS — Z79899 Other long term (current) drug therapy: Secondary | ICD-10-CM | POA: Insufficient documentation

## 2020-06-04 DIAGNOSIS — C779 Secondary and unspecified malignant neoplasm of lymph node, unspecified: Secondary | ICD-10-CM | POA: Diagnosis not present

## 2020-06-04 DIAGNOSIS — N289 Disorder of kidney and ureter, unspecified: Secondary | ICD-10-CM

## 2020-06-04 DIAGNOSIS — K449 Diaphragmatic hernia without obstruction or gangrene: Secondary | ICD-10-CM | POA: Insufficient documentation

## 2020-06-04 DIAGNOSIS — C778 Secondary and unspecified malignant neoplasm of lymph nodes of multiple regions: Secondary | ICD-10-CM

## 2020-06-04 DIAGNOSIS — E213 Hyperparathyroidism, unspecified: Secondary | ICD-10-CM | POA: Diagnosis not present

## 2020-06-04 DIAGNOSIS — Z8542 Personal history of malignant neoplasm of other parts of uterus: Secondary | ICD-10-CM | POA: Diagnosis present

## 2020-06-04 DIAGNOSIS — K5730 Diverticulosis of large intestine without perforation or abscess without bleeding: Secondary | ICD-10-CM | POA: Insufficient documentation

## 2020-06-04 DIAGNOSIS — N281 Cyst of kidney, acquired: Secondary | ICD-10-CM | POA: Diagnosis not present

## 2020-06-04 LAB — CBC WITH DIFFERENTIAL/PLATELET
Abs Immature Granulocytes: 0.05 10*3/uL (ref 0.00–0.07)
Basophils Absolute: 0 10*3/uL (ref 0.0–0.1)
Basophils Relative: 1 %
Eosinophils Absolute: 0.1 10*3/uL (ref 0.0–0.5)
Eosinophils Relative: 1 %
HCT: 38.7 % (ref 36.0–46.0)
Hemoglobin: 13 g/dL (ref 12.0–15.0)
Immature Granulocytes: 1 %
Lymphocytes Relative: 20 %
Lymphs Abs: 0.9 10*3/uL (ref 0.7–4.0)
MCH: 31.7 pg (ref 26.0–34.0)
MCHC: 33.6 g/dL (ref 30.0–36.0)
MCV: 94.4 fL (ref 80.0–100.0)
Monocytes Absolute: 0.4 10*3/uL (ref 0.1–1.0)
Monocytes Relative: 8 %
Neutro Abs: 3.2 10*3/uL (ref 1.7–7.7)
Neutrophils Relative %: 69 %
Platelets: 223 10*3/uL (ref 150–400)
RBC: 4.1 MIL/uL (ref 3.87–5.11)
RDW: 12.8 % (ref 11.5–15.5)
WBC: 4.7 10*3/uL (ref 4.0–10.5)
nRBC: 0 % (ref 0.0–0.2)

## 2020-06-04 LAB — COMPREHENSIVE METABOLIC PANEL
ALT: 19 U/L (ref 0–44)
AST: 16 U/L (ref 15–41)
Albumin: 4.3 g/dL (ref 3.5–5.0)
Alkaline Phosphatase: 68 U/L (ref 38–126)
Anion gap: 10 (ref 5–15)
BUN: 12 mg/dL (ref 8–23)
CO2: 23 mmol/L (ref 22–32)
Calcium: 10.9 mg/dL — ABNORMAL HIGH (ref 8.9–10.3)
Chloride: 107 mmol/L (ref 98–111)
Creatinine, Ser: 0.83 mg/dL (ref 0.44–1.00)
GFR calc Af Amer: >60 mL/min (ref >60)
GFR calc non Af Amer: >60 mL/min (ref >60)
Glucose, Bld: 139 mg/dL — ABNORMAL HIGH (ref 70–99)
Potassium: 4.1 mmol/L (ref 3.5–5.1)
Sodium: 140 mmol/L (ref 135–145)
Total Bilirubin: 0.9 mg/dL (ref 0.3–1.2)
Total Protein: 7.1 g/dL (ref 6.5–8.1)

## 2020-06-04 MED ORDER — HEPARIN SOD (PORK) LOCK FLUSH 100 UNIT/ML IV SOLN
INTRAVENOUS | Status: AC
  Filled 2020-06-04: qty 5

## 2020-06-04 MED ORDER — IOHEXOL 300 MG/ML  SOLN
100.0000 mL | Freq: Once | INTRAMUSCULAR | Status: AC | PRN
  Administered 2020-06-04: 100 mL via INTRAVENOUS

## 2020-06-04 MED ORDER — SODIUM CHLORIDE (PF) 0.9 % IJ SOLN
INTRAMUSCULAR | Status: AC
  Filled 2020-06-04: qty 50

## 2020-06-04 MED ORDER — HEPARIN SOD (PORK) LOCK FLUSH 100 UNIT/ML IV SOLN
500.0000 [IU] | Freq: Once | INTRAVENOUS | Status: AC
  Administered 2020-06-04: 500 [IU] via INTRAVENOUS

## 2020-06-04 MED ORDER — SODIUM CHLORIDE 0.9% FLUSH
10.0000 mL | INTRAVENOUS | Status: DC | PRN
  Administered 2020-06-04: 10 mL
  Filled 2020-06-04: qty 10

## 2020-06-04 NOTE — Progress Notes (Signed)
Patient left accessed for CT 

## 2020-06-05 ENCOUNTER — Other Ambulatory Visit: Payer: Self-pay

## 2020-06-05 ENCOUNTER — Telehealth: Payer: Self-pay | Admitting: Hematology and Oncology

## 2020-06-05 ENCOUNTER — Inpatient Hospital Stay (HOSPITAL_BASED_OUTPATIENT_CLINIC_OR_DEPARTMENT_OTHER): Payer: Medicare Other | Admitting: Hematology and Oncology

## 2020-06-05 ENCOUNTER — Encounter: Payer: Self-pay | Admitting: Hematology and Oncology

## 2020-06-05 VITALS — BP 138/65 | HR 63 | Temp 98.7°F | Resp 18 | Ht 62.0 in | Wt 163.6 lb

## 2020-06-05 DIAGNOSIS — C55 Malignant neoplasm of uterus, part unspecified: Secondary | ICD-10-CM

## 2020-06-05 DIAGNOSIS — C778 Secondary and unspecified malignant neoplasm of lymph nodes of multiple regions: Secondary | ICD-10-CM | POA: Diagnosis not present

## 2020-06-05 DIAGNOSIS — E213 Hyperparathyroidism, unspecified: Secondary | ICD-10-CM

## 2020-06-05 NOTE — Assessment & Plan Note (Signed)
She has hyperparathyroidism with chronic hypercalcemia She is not symptomatic Observe for now 

## 2020-06-05 NOTE — Progress Notes (Signed)
[facility] OFFICE PROGRESS NOTE  Patient Care Team: [name], DO as PCP - General (Family Medicine)  ASSESSMENT & PLAN:  Uterine cancer [facility]) She has no evidence of recurrence of disease However, her risk remains high The changes in the lymph nodes are nonspecific I recommend port flush in about 6 weeks and see her back in about 3 months with repeat blood work and imaging studies She agreed with the plan of care  Metastasis to lymph nodes ([place]) The changes in the lymph nodes are nonspecific Observe closely for now  Hyperparathyroidism, unspecified ([place]) She has hyperparathyroidism with chronic hypercalcemia She is not symptomatic Observe for now   Orders Placed This Encounter  Procedures  . CT ABDOMEN PELVIS W CONTRAST    Standing Status:   Future    Standing Expiration Date:   06/05/2021    Order Specific Question:   If indicated for the ordered procedure, I authorize the administration of contrast media per Radiology protocol    Answer:   Yes    Order Specific Question:   Preferred imaging location?    Answer:   [facility]    Order Specific Question:   Radiology Contrast Protocol - do NOT remove file path    Answer:   [email or web address]    All questions were answered. The patient knows to call the clinic with any problems, questions or concerns. The total time spent in the appointment was 20 minutes encounter with patients including review of chart and various tests results, discussions about plan of care and coordination of care plan   Heath Lark, MD 06/05/2020 11:22 AM  INTERVAL HISTORY: Please see below for problem oriented charting. She returns for further follow-up and review of imaging studies She feels well No recent infection, fever or chills Her blood sugar and blood pressure monitoring at home was satisfactory She remains active No abdominal pain no changes in bowel habits  SUMMARY OF  ONCOLOGIC HISTORY: Oncology History Overview Note  Her2 neg PD-L neg Low mutation burden MSI stable NTRK neg   Uterine cancer ([place])  12/01/2017 Initial Diagnosis   She presented with postmenopausal bleeding   12/12/2017 Surgery   She had complete hysterectomy, BSO and bilateral pelvic sentinel lymph node excision   01/08/2018 Tumor Marker   Patient's tumor was tested for the following markers: CA-125 Results of the tumor marker test revealed 70.   01/18/2018 Imaging   CT scan of the chest, abdomen and pelvis elsewhere show lung nodules, kidney lesion as well as 1.5 x 1 cm left, iliac lymph node   01/23/2018 - 06/12/2018 Chemotherapy   The patient had adjuvant chemotherapy with carboplatin and taxol elsewhere for chemotherapy treatment.     06/25/2018 Imaging   CT scan of the chest, abdomen and pelvis showed persistent lung nodules, kidney lesion but reduced size of previously noted pelvic lymphadenopathy to 6 x 4 mm   07/09/2018 - 08/17/2018 Radiation Therapy   She had daily radiation treatment for 5 weeks elsewhere in [place], 28 days treatment, 5040 cGy   09/25/2018 Imaging   CT scan of the chest, abdomen and pelvis shows stable lung nodule and kidney lesion.  Previously noted enlarged lymphadenopathy in the external iliac region has resolved   01/16/2019 Imaging   CT imaging of the chest, abdomen and pelvis elsewhere showed stable kidney lesions but no evidence of cancer recurrence   05/16/2019 Imaging   Outside CT scan showed stable pulmonary nodules.  Bilateral simple renal cyst  with an exophytic lesion in the medial cortex of the lower pole of the right kidney measure 10 mm with enhancement postcontrast.  There is 6 mm lymph node in the left para-aortic region.  There is an enlarging hyper enhancing aortocaval lymph node measuring 5 mm.   08/27/2019 PET scan   There is left supraclavicular lymph node measure 7 mm, another additional supraclavicular region 9 mm with SUV of 8.3.   There is abnormal uptake in retrocrural lymph nodes, SUV 3.7.  There is elevated FDG uptake in the patient's known retroperitoneal lymph nodes.  There is a small solid-appearing right inferior medial renal lesion with no FDG uptake.   09/13/2019 Procedure   She underwent biopsy of 2 left supraclavicular lymph node elsewhere   09/13/2019 Pathology Results   Immunohistochemical stains performed on the biopsy report showed that the tumor cells are strongly and diffusely positive for p16, PAX 8, p53, CK7, pancytokeratin and beta-catenin.  CA-125, CK20 and TTF-1 are negative.  Ki-67 stains approximately 50% of tumor cells.  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   10/09/2019 -  Chemotherapy   The patient had docetaxel and carboplatin for chemotherapy treatment elsewhere in [place].  Her treatment was initially given weekly.  Cycle 1 was complicated by neutropenia.  Cycle 2 was dose adjusted and delay.  Cycle 2 treatment was complicated by loss of appetite, severe diarrhea and persistent neutropenia.  Cycle 3 was delay as well.  She went to the emergency department twice for IV fluid hydration and have lost a lot of weight.    12/18/2019 Imaging   CT chest, abdomen and pelvis 1. Interval resolution of the left supraclavicular, retroperitoneal, and right retrocrural lymphadenopathy seen on previous diagnostic CT and PET-CT. Tiny lymph nodes are seen in the retroperitoneal region today. 2. Tiny bilateral pulmonary nodules, indeterminate. These do not appear substantially changed although we do not have a prior diagnostic CT chest to compare. Previous PET-CT is a free breathing exam which obscure small pulmonary nodules. Attention on follow-up recommended. 3. Bilateral interstitial and patchy peripheral ground-glass opacity in both lungs, nonspecific but potentially related to infectious/inflammatory process or scarring. 4. 10 mm  enhancing lesion lower pole right kidney, suspicious for neoplasm. MRI without and with contrast recommended to further evaluate. 5. Small hiatal hernia. 6. Enlargement of the pulmonary outflow tract and main pulmonary arteries compatible with pulmonary arterial hypertension.   03/09/2020 Imaging   1. No findings suspicious for metastatic disease in the chest, abdomen or pelvis. 2. Small pulmonary nodules are all stable and warrant continued chest CT follow-up. 3. Stable indeterminate exophytic 1.2 cm renal cortical lesion in the lateral lower left kidney, renal cell carcinoma not excluded. Dedicated MRI abdomen without and with IV contrast may be obtained for further evaluation as clinically warranted. 4. Chronic findings include: Stable dilated main pulmonary artery, suggesting pulmonary arterial hypertension. Small hiatal hernia. Mild left colonic diverticulosis. Two-vessel coronary atherosclerosis. Aortic Atherosclerosis (ICD10-I70.0).     06/04/2020 Imaging   1. Interval progression of small retroperitoneal lymph nodes. While not technically enlarged by CT criteria, the interval progression is concerning. The enlargement of these nodes is more apparent when comparing back to the older study from 12/18/2019 and they are now similar in size to the abnormal lymph nodes seen on the outside imaging of 12/03/2019. Close follow-up recommended. 2. Bilateral pulmonary nodules, similar to prior. The 6 mm right lower lobe nodule appears more confluent than on the  prior study and is slightly increased from 5 mm when I remeasure on the previous exam. Close attention on follow-up recommended. 3. Small hiatal hernia. 4. Left colonic diverticulosis without diverticulitis. 5. Aortic Atherosclerosis (ICD10-I70.0).     REVIEW OF SYSTEMS:   Constitutional: Denies fevers, chills or abnormal weight loss Eyes: Denies blurriness of vision Ears, nose, mouth, throat, and face: Denies mucositis or sore  throat Respiratory: Denies cough, dyspnea or wheezes Cardiovascular: Denies palpitation, chest discomfort or lower extremity swelling Gastrointestinal:  Denies nausea, heartburn or change in bowel habits Skin: Denies abnormal skin rashes Lymphatics: Denies new lymphadenopathy or easy bruising Neurological:Denies numbness, tingling or new weaknesses Behavioral/Psych: Mood is stable, no new changes  All other systems were reviewed with the patient and are negative.  I have reviewed the past medical history, past surgical history, social history and family history with the patient and they are unchanged from previous note.  ALLERGIES:  is allergic to verapamil.  MEDICATIONS:  Current Outpatient Medications  Medication Sig Dispense Refill  . amLODipine (NORVASC) 5 MG tablet Take 1 tablet (5 mg total) by mouth daily. 90 tablet 2  . aspirin EC 81 MG tablet Take 81 mg by mouth daily.    Marland Kitchen glipiZIDE (GLUCOTROL) 5 MG tablet Take 0.5 tablets by mouth 2 (two) times daily.    Marland Kitchen lidocaine-prilocaine (EMLA) cream Apply 1 application topically as needed.    Marland Kitchen losartan (COZAAR) 100 MG tablet Take 100 mg by mouth daily.    . metoprolol succinate (TOPROL-XL) 100 MG 24 hr tablet Take 100 mg by mouth daily. Take with or immediately following a meal.    . rosuvastatin (CRESTOR) 10 MG tablet Take 1 tablet (10 mg total) by mouth daily. 90 tablet 2  . terazosin (HYTRIN) 2 MG capsule Take 1 capsule (2 mg total) by mouth at bedtime. 30 capsule 1   No current facility-administered medications for this visit.    PHYSICAL EXAMINATION: ECOG PERFORMANCE STATUS: 0 - Asymptomatic  Vitals:   06/05/20 1016  BP: 138/65  Pulse: 63  Resp: 18  Temp: 98.7 F (37.1 C)  SpO2: 98%   Filed Weights   06/05/20 1016  Weight: 163 lb 9.6 oz (74.2 kg)    GENERAL:alert, no distress and comfortable NEURO: alert & oriented x 3 with fluent speech, no focal motor/sensory deficits  LABORATORY DATA:  I have reviewed the  data as listed    Component Value Date/Time   NA 140 06/04/2020 1100   K 4.1 06/04/2020 1100   CL 107 06/04/2020 1100   CO2 23 06/04/2020 1100   GLUCOSE 139 (H) 06/04/2020 1100   BUN 12 06/04/2020 1100   CREATININE 0.83 06/04/2020 1100   CALCIUM 10.9 (H) 06/04/2020 1100   PROT 7.1 06/04/2020 1100   ALBUMIN 4.3 06/04/2020 1100   AST 16 06/04/2020 1100   ALT 19 06/04/2020 1100   ALKPHOS 68 06/04/2020 1100   BILITOT 0.9 06/04/2020 1100   GFRNONAA >60 06/04/2020 1100   GFRAA >60 06/04/2020 1100    No results found for: SPEP, UPEP  Lab Results  Component Value Date   WBC 4.7 06/04/2020   NEUTROABS 3.2 06/04/2020   HGB 13.0 06/04/2020   HCT 38.7 06/04/2020   MCV 94.4 06/04/2020   PLT 223 06/04/2020      Chemistry      Component Value Date/Time   NA 140 06/04/2020 1100   K 4.1 06/04/2020 1100   CL 107 06/04/2020 1100   CO2 23 06/04/2020 1100  BUN 12 06/04/2020 1100   CREATININE 0.83 06/04/2020 1100      Component Value Date/Time   CALCIUM 10.9 (H) 06/04/2020 1100   ALKPHOS 68 06/04/2020 1100   AST 16 06/04/2020 1100   ALT 19 06/04/2020 1100   BILITOT 0.9 06/04/2020 1100       RADIOGRAPHIC STUDIES: I have reviewed multiple imaging studies with the patient and her sister I have personally reviewed the radiological images as listed and agreed with the findings in the report. CT ABDOMEN PELVIS W CONTRAST  Result Date: 06/05/2020 CLINICAL DATA:  Uterine cancer diagnosed in 2019.  Restaging. EXAM: CT ABDOMEN AND PELVIS WITH CONTRAST TECHNIQUE: Multidetector CT imaging of the abdomen and pelvis was performed using the standard protocol following bolus administration of intravenous contrast. CONTRAST:  169m OMNIPAQUE IOHEXOL 300 MG/ML  SOLN COMPARISON:  03/09/2020 FINDINGS: Lower chest: 6 mm right lower lobe nodule on 27/6 appears more confluent than on the prior study and is slightly increased from 5 mm when I remeasure on the previous exam. 3 mm right lower lobe nodule on  19/6 is similar. 7 mm left lower lobe nodule on 25/6 is not substantially changed. Hepatobiliary: No suspicious focal abnormality within the liver parenchyma. Gallbladder is surgically absent. No intrahepatic or extrahepatic biliary dilation. Pancreas: No focal mass lesion. No dilatation of the main duct. No intraparenchymal cyst. No peripancreatic edema. Spleen: No splenomegaly. No focal mass lesion. Adrenals/Urinary Tract: No adrenal nodule or mass. Bilateral renal cysts again noted. No evidence for hydroureter. The urinary bladder appears normal for the degree of distention. Stomach/Bowel: Small hiatal hernia. Stomach otherwise unremarkable. Duodenum is normally positioned as is the ligament of Treitz. No small bowel wall thickening. No small bowel dilatation. The terminal ileum is normal. The appendix is normal. No gross colonic mass. No colonic wall thickening. Diverticular changes are noted in the left colon without evidence of diverticulitis. Vascular/Lymphatic: There is abdominal aortic atherosclerosis without aneurysm. No gastrohepatic or hepatoduodenal ligament lymphadenopathy. Small retroperitoneal lymph nodes are progressive in the interval. 7 mm short axis aortocaval node on 32/2 was 6 mm when remeasured previously. 9 mm short axis left para-aortic node has a round configuration and has increased from 5 mm short axis previously. 7 mm short axis retroaortic node on 37/2 was 5 mm previously. No pelvic sidewall lymphadenopathy. Reproductive: The uterus is surgically absent. There is no adnexal mass. Other: No intraperitoneal free fluid. Musculoskeletal: No worrisome lytic or sclerotic osseous abnormality. IMPRESSION: 1. Interval progression of small retroperitoneal lymph nodes. While not technically enlarged by CT criteria, the interval progression is concerning. The enlargement of these nodes is more apparent when comparing back to the older study from 12/18/2019 and they are now similar in size to the  abnormal lymph nodes seen on the outside imaging of 12/03/2019. Close follow-up recommended. 2. Bilateral pulmonary nodules, similar to prior. The 6 mm right lower lobe nodule appears more confluent than on the prior study and is slightly increased from 5 mm when I remeasure on the previous exam. Close attention on follow-up recommended. 3. Small hiatal hernia. 4. Left colonic diverticulosis without diverticulitis. 5. Aortic Atherosclerosis (ICD10-I70.0). Electronically Signed   By: EMisty StanleyM.D.   On: 06/05/2020 09:38   MM 3D SCREEN BREAST BILATERAL  Result Date: 05/20/2020 CLINICAL DATA:  Screening. EXAM: DIGITAL SCREENING BILATERAL MAMMOGRAM WITH TOMO AND CAD COMPARISON:  Previous exam(s). ACR Breast Density Category b: There are scattered areas of fibroglandular density. FINDINGS: There are no findings suspicious for malignancy.  Images were processed with CAD. IMPRESSION: No mammographic evidence of malignancy. A result letter of this screening mammogram will be mailed directly to the patient. RECOMMENDATION: Screening mammogram in one year. (Code:[id number]-B-01Y) BI-RADS CATEGORY  1: Negative. Electronically Signed   By: Lajean Manes M.D.   On: 05/20/2020 14:51

## 2020-06-05 NOTE — Assessment & Plan Note (Signed)
The changes in the lymph nodes are nonspecific Observe closely for now

## 2020-06-05 NOTE — Assessment & Plan Note (Signed)
She has no evidence of recurrence of disease However, her risk remains high The changes in the lymph nodes are nonspecific I recommend port flush in about 6 weeks and see her back in about 3 months with repeat blood work and imaging studies She agreed with the plan of care

## 2020-06-05 NOTE — Telephone Encounter (Signed)
Scheduled appts per 7/9 sch msg. Gave pt a print out of AVS.

## 2020-07-17 ENCOUNTER — Inpatient Hospital Stay: Payer: Medicare Other | Attending: Hematology and Oncology

## 2020-07-17 ENCOUNTER — Other Ambulatory Visit: Payer: Self-pay

## 2020-07-17 DIAGNOSIS — Z452 Encounter for adjustment and management of vascular access device: Secondary | ICD-10-CM | POA: Diagnosis not present

## 2020-07-17 DIAGNOSIS — Z8542 Personal history of malignant neoplasm of other parts of uterus: Secondary | ICD-10-CM | POA: Insufficient documentation

## 2020-07-17 DIAGNOSIS — Z95828 Presence of other vascular implants and grafts: Secondary | ICD-10-CM

## 2020-07-17 MED ORDER — SODIUM CHLORIDE 0.9% FLUSH
10.0000 mL | INTRAVENOUS | Status: DC | PRN
  Administered 2020-07-17: 10 mL
  Filled 2020-07-17: qty 10

## 2020-07-17 MED ORDER — HEPARIN SOD (PORK) LOCK FLUSH 100 UNIT/ML IV SOLN
500.0000 [IU] | Freq: Once | INTRAVENOUS | Status: AC | PRN
  Administered 2020-07-17: 500 [IU]
  Filled 2020-07-17: qty 5

## 2020-08-31 ENCOUNTER — Inpatient Hospital Stay: Payer: Medicare Other

## 2020-08-31 ENCOUNTER — Other Ambulatory Visit: Payer: Self-pay

## 2020-08-31 ENCOUNTER — Inpatient Hospital Stay: Payer: Medicare Other | Attending: Hematology and Oncology

## 2020-08-31 ENCOUNTER — Ambulatory Visit (HOSPITAL_COMMUNITY)
Admission: RE | Admit: 2020-08-31 | Discharge: 2020-08-31 | Disposition: A | Discharge status: Home / Self Care | Payer: Medicare Other | Source: Ambulatory Visit | Attending: Hematology and Oncology | Admitting: Hematology and Oncology

## 2020-08-31 DIAGNOSIS — C55 Malignant neoplasm of uterus, part unspecified: Secondary | ICD-10-CM

## 2020-08-31 DIAGNOSIS — Z95828 Presence of other vascular implants and grafts: Secondary | ICD-10-CM

## 2020-08-31 DIAGNOSIS — C778 Secondary and unspecified malignant neoplasm of lymph nodes of multiple regions: Secondary | ICD-10-CM

## 2020-08-31 DIAGNOSIS — Z5112 Encounter for antineoplastic immunotherapy: Secondary | ICD-10-CM | POA: Diagnosis not present

## 2020-08-31 DIAGNOSIS — E213 Hyperparathyroidism, unspecified: Secondary | ICD-10-CM | POA: Diagnosis not present

## 2020-08-31 DIAGNOSIS — N289 Disorder of kidney and ureter, unspecified: Secondary | ICD-10-CM

## 2020-08-31 DIAGNOSIS — I10 Essential (primary) hypertension: Secondary | ICD-10-CM | POA: Insufficient documentation

## 2020-08-31 LAB — CBC WITH DIFFERENTIAL/PLATELET
Abs Immature Granulocytes: 0.04 10*3/uL (ref 0.00–0.07)
Basophils Absolute: 0.1 10*3/uL (ref 0.0–0.1)
Basophils Relative: 1 %
Eosinophils Absolute: 0.1 10*3/uL (ref 0.0–0.5)
Eosinophils Relative: 1 %
HCT: 38.1 % (ref 36.0–46.0)
Hemoglobin: 12.8 g/dL (ref 12.0–15.0)
Immature Granulocytes: 1 %
Lymphocytes Relative: 19 %
Lymphs Abs: 0.9 10*3/uL (ref 0.7–4.0)
MCH: 30.8 pg (ref 26.0–34.0)
MCHC: 33.6 g/dL (ref 30.0–36.0)
MCV: 91.8 fL (ref 80.0–100.0)
Monocytes Absolute: 0.4 10*3/uL (ref 0.1–1.0)
Monocytes Relative: 8 %
Neutro Abs: 3.5 10*3/uL (ref 1.7–7.7)
Neutrophils Relative %: 70 %
Platelets: 211 10*3/uL (ref 150–400)
RBC: 4.15 MIL/uL (ref 3.87–5.11)
RDW: 12.2 % (ref 11.5–15.5)
WBC: 4.9 10*3/uL (ref 4.0–10.5)
nRBC: 0 % (ref 0.0–0.2)

## 2020-08-31 LAB — COMPREHENSIVE METABOLIC PANEL
ALT: 19 U/L (ref 0–44)
AST: 19 U/L (ref 15–41)
Albumin: 4 g/dL (ref 3.5–5.0)
Alkaline Phosphatase: 57 U/L (ref 38–126)
Anion gap: 7 (ref 5–15)
BUN: 14 mg/dL (ref 8–23)
CO2: 24 mmol/L (ref 22–32)
Calcium: 10.6 mg/dL — ABNORMAL HIGH (ref 8.9–10.3)
Chloride: 106 mmol/L (ref 98–111)
Creatinine, Ser: 0.81 mg/dL (ref 0.44–1.00)
GFR calc Af Amer: >60 mL/min (ref >60)
GFR calc non Af Amer: >60 mL/min (ref >60)
Glucose, Bld: 140 mg/dL — ABNORMAL HIGH (ref 70–99)
Potassium: 4 mmol/L (ref 3.5–5.1)
Sodium: 137 mmol/L (ref 135–145)
Total Bilirubin: 0.9 mg/dL (ref 0.3–1.2)
Total Protein: 6.9 g/dL (ref 6.5–8.1)

## 2020-08-31 MED ORDER — HEPARIN SOD (PORK) LOCK FLUSH 100 UNIT/ML IV SOLN
500.0000 [IU] | Freq: Once | INTRAVENOUS | Status: AC
  Administered 2020-08-31: 500 [IU] via INTRAVENOUS

## 2020-08-31 MED ORDER — IOHEXOL 300 MG/ML  SOLN
100.0000 mL | Freq: Once | INTRAMUSCULAR | Status: AC | PRN
  Administered 2020-08-31: 100 mL via INTRAVENOUS

## 2020-08-31 MED ORDER — SODIUM CHLORIDE 0.9% FLUSH
10.0000 mL | INTRAVENOUS | Status: DC | PRN
  Administered 2020-08-31: 10 mL
  Filled 2020-08-31: qty 10

## 2020-08-31 MED ORDER — HEPARIN SOD (PORK) LOCK FLUSH 100 UNIT/ML IV SOLN
INTRAVENOUS | Status: AC
  Filled 2020-08-31: qty 5

## 2020-09-01 ENCOUNTER — Inpatient Hospital Stay (HOSPITAL_BASED_OUTPATIENT_CLINIC_OR_DEPARTMENT_OTHER): Payer: Medicare Other | Admitting: Hematology and Oncology

## 2020-09-01 ENCOUNTER — Other Ambulatory Visit: Payer: Self-pay

## 2020-09-01 VITALS — BP 162/84 | HR 61 | Temp 97.4°F | Resp 18 | Ht 62.0 in | Wt 165.2 lb

## 2020-09-01 DIAGNOSIS — C55 Malignant neoplasm of uterus, part unspecified: Secondary | ICD-10-CM | POA: Diagnosis not present

## 2020-09-01 DIAGNOSIS — I10 Essential (primary) hypertension: Secondary | ICD-10-CM

## 2020-09-01 DIAGNOSIS — Z7189 Other specified counseling: Secondary | ICD-10-CM | POA: Diagnosis not present

## 2020-09-01 DIAGNOSIS — Z5112 Encounter for antineoplastic immunotherapy: Secondary | ICD-10-CM | POA: Diagnosis not present

## 2020-09-01 MED ORDER — LIDOCAINE-PRILOCAINE 2.5-2.5 % EX CREA: TOPICAL_CREAM | CUTANEOUS | 3 refills | Status: AC

## 2020-09-01 MED ORDER — ONDANSETRON HCL 8 MG PO TABS: 8.0000 mg | ORAL_TABLET | Freq: Three times a day (TID) | ORAL | 1 refills | Status: DC | PRN

## 2020-09-01 MED ORDER — PROCHLORPERAZINE MALEATE 10 MG PO TABS: 10.0000 mg | ORAL_TABLET | Freq: Four times a day (QID) | ORAL | 1 refills | Status: DC | PRN

## 2020-09-01 NOTE — Progress Notes (Signed)
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

## 2020-09-02 ENCOUNTER — Encounter: Payer: Self-pay | Admitting: Hematology and Oncology

## 2020-09-02 ENCOUNTER — Telehealth: Payer: Self-pay

## 2020-09-02 ENCOUNTER — Telehealth: Payer: Self-pay | Admitting: Pharmacist

## 2020-09-02 DIAGNOSIS — C55 Malignant neoplasm of uterus, part unspecified: Secondary | ICD-10-CM

## 2020-09-02 MED ORDER — LENVIMA (10 MG DAILY DOSE) 10 MG PO CPPK: 10.0000 mg | ORAL_CAPSULE | Freq: Every day | ORAL | 11 refills | Status: DC

## 2020-09-02 MED ORDER — LENVATINIB (20 MG DAILY DOSE) 2 X 10 MG PO CPPK: 10.0000 mg | ORAL_CAPSULE | Freq: Every day | ORAL | 11 refills | Status: DC

## 2020-09-02 NOTE — Progress Notes (Signed)
[place] OFFICE PROGRESS NOTE  Patient Care Team: [name], DO as PCP - General (Family Medicine)  ASSESSMENT & PLAN:  Uterine cancer [facility]) I have reviewed all imaging studies with her and family Unfortunately she has signs of disease recurrence Fortunately she is no symptomatic, yet Disease burden is still low   We reviewed the current guidelines Goal is palliative I recommend switching her treatment with Lenvima and pembrolizumab.   The combination was approved following review conducted under [employer], an intiative of the [place] which provides a framework for concurrent submission and review of oncology drugs among international partners. The FDA approved the combination with the [place] Therapeutic Goods Administration and [facility] [place].   Efficacy of the drugs together was investigated in Study 111/KEYNOTE-146 ([id number]), a single-arm, multicenter, open-label, multi-cohort trial that enrolled 108 patients with metastatic endometrial cancer that had progressed following at least one prior systemic therapy in any setting.  Patients took 20 mg of lenvatinib orally once daily in combination with 200 mg of pembrolizumab administered intravenously every 3 weeks until unacceptable toxicity or disease progression.  Among the 108 patients, 94 had tumors that were not MSI-H or dMMR, 11 had tumors that were MSI-H or dMMR, and in 3 patients the tumor MSI-H or dMMR status was not known.  Results  The major efficacy outcome measures were objective response rate (ORR) and duration of response (DOR) by independent radiologic review committee using RECIST 1.1. The ORR in the 94 patients whose tumors were not MSI-H or dMMR was 38.3% (95% CI: 29%, 49%) with 10 complete responses (10.6%) and 26 partial responses (27.7%). Median DOR was not reached at the time of data cutoff and 25 patients (69% of responders) had response durations ?6  months.  In another updated publication published on JCO: DOI: 10.1200/JCO.19.02627 Journal of Clinical Oncology, Published online February 08, 2019. Lenvatinib Plus Pembrolizumab in Patients With Advanced Endometrial Cancer Abstract PURPOSE  Patients with advanced endometrial carcinoma have limited treatment options. We report final primary efficacy analysis results for a patient cohort with advanced endometrial carcinoma receiving lenvatinib plus pembrolizumab in an ongoing phase Ib/II study of selected solid tumors. METHODS  Patients took lenvatinib 20 mg once daily orally plus pembrolizumab 200 mg intravenously once every 3 weeks, in 3-week cycles. The primary end point was objective response rate (ORR) at 24 weeks ([id number]); secondary efficacy end points included duration of response (DOR), progression-free survival (PFS), and overall survival (OS). Tumor assessments were evaluated by investigators per immune-related RECIST.  RESULTS  At data cutoff, 108 patients with previously treated endometrial carcinoma were enrolled, with a median follow-up of 18.7 months. The FOYDX41 was 38.0% (95% CI, 28.8% to 47.8%). Among subgroups, the OINOM76 (95% CI) was 63.6% (30.8% to 89.1%) in patients with microsatellite instability (MSI)-high tumors (n = 11) and 36.2% (26.5% to 46.7%) in patients with microsatellite-stable tumors (n = 94). For previously treated patients, regardless of tumor MSI status, the median DOR was 21.2 months (95% CI, 7.6 months to not estimable), median PFS was 7.4 months (95% CI, 5.3 to 8.7 months), and median OS was 16.7 months (15.0 months to not estimable). Grade 3 or 4 treatment-related adverse events occurred in 83/124 (66.9%) patients. CONCLUSION  Lenvatinib plus pembrolizumab showed promising antitumor activity in patients with advanced endometrial carcinoma who have experienced disease progression after prior systemic therapy, regardless of tumor MSI status. The combination therapy  had a manageable toxicity profile. The most common adverse reactions  for endometrial cancer were fatigue, hypertension, musculoskeletal pain, diarrhea, decreased appetite, hypothyroidism, nausea, stomatitis, vomiting, decreased weight, abdominal pain, headache, constipation, urinary tract infection, dysphonia, hemorrhagic events, hypomagnesemia, palmar-plantar erythrodysesthesia, dyspnea, cough, and rash.   The most common adverse reactions for endometrial cancer were fatigue, hypertension, musculoskeletal pain, diarrhea, decreased appetite, hypothyroidism, nausea, stomatitis, vomiting, decreased weight, abdominal pain, headache, constipation, urinary tract infection, dysphonia, hemorrhagic events, hypomagnesemia, palmar-plantar erythrodysesthesia, dyspnea, cough, and rash.   After much discussion, the patient would like to proceed with the plan of care. Due to history of profound pancytopenia with chemo, even though her counts are ok now, I plan to start her with intermediate dose of Lenvima at 10 mg daily We will start Rx in 2 weeks  Essential hypertension Her blood pressure control has improved at home She will continue close monitoring at home and medication adjustment through her primary care doctor and I warned her risk of uncontrolled hypertension while on Lenvima  Goals of care, counseling/discussion She is aware her disease is incurable and goal of treatment is palliative   Orders Placed This Encounter  Procedures  . CBC with Differential ([place] Only)    Standing Status:   Standing    Number of Occurrences:   20    Standing Expiration Date:   09/02/2021  . CMP ([place] only)    Standing Status:   Standing    Number of Occurrences:   20    Standing Expiration Date:   09/02/2021  . T4    Standing Status:   Standing    Number of Occurrences:   20    Standing Expiration Date:   09/02/2021  . TSH    Standing Status:   Standing    Number of Occurrences:   20    Standing  Expiration Date:   09/02/2021  . Total Protein, Urine dipstick    Standing Status:   Standing    Number of Occurrences:   20    Standing Expiration Date:   09/02/2021    All questions were answered. The patient knows to call the clinic with any problems, questions or concerns. The total time spent in the appointment was 40 minutes encounter with patients including review of chart and various tests results, discussions about plan of care and coordination of care plan   Heath Lark, MD 09/02/2020 7:41 AM  INTERVAL HISTORY: Please see below for problem oriented charting. 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   01/18/2018 Imaging   CT scan of the chest, abdomen and pelvis elsewhere show lung nodules, kidney lesion as well as 1.5 x 1 cm left, iliac lymph node   01/23/2018 - 06/12/2018 Chemotherapy   The patient had adjuvant chemotherapy with carboplatin and taxol elsewhere for chemotherapy treatment.     06/25/2018 Imaging   CT scan of the chest, abdomen and pelvis showed persistent lung nodules, kidney lesion but reduced size of previously  noted pelvic lymphadenopathy to 6 x 4 mm   07/09/2018 - 08/17/2018 Radiation Therapy   She had daily radiation treatment for 5 weeks elsewhere in [place], 28 days treatment, 5040 cGy   09/25/2018 Imaging   CT scan of the chest, abdomen and pelvis shows stable lung  nodule and kidney lesion.  Previously noted enlarged lymphadenopathy in the external iliac region has resolved   01/16/2019 Imaging   CT imaging of the chest, abdomen and pelvis elsewhere showed stable kidney lesions but no evidence of cancer recurrence   05/16/2019 Imaging   Outside CT scan showed stable pulmonary nodules.  Bilateral simple renal cyst with an exophytic lesion in the medial cortex of the lower pole of the right kidney measure 10 mm with enhancement postcontrast.  There is 6 mm lymph node in the left para-aortic region.  There is an enlarging hyper enhancing aortocaval lymph node measuring 5 mm.   08/27/2019 PET scan   There is left supraclavicular lymph node measure 7 mm, another additional supraclavicular region 9 mm with SUV of 8.3.  There is abnormal uptake in retrocrural lymph nodes, SUV 3.7.  There is elevated FDG uptake in the patient's known retroperitoneal lymph nodes.  There is a small solid-appearing right inferior medial renal lesion with no FDG uptake.   09/13/2019 Procedure   She underwent biopsy of 2 left supraclavicular lymph node elsewhere   09/13/2019 Pathology Results   Immunohistochemical stains performed on the biopsy report showed that the tumor cells are strongly and diffusely positive for p16, PAX 8, p53, CK7, pancytokeratin and beta-catenin.  CA-125, CK20 and TTF-1 are negative.  Ki-67 stains approximately 50% of tumor cells.  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   10/09/2019 -  Chemotherapy   The patient had docetaxel and carboplatin for chemotherapy treatment elsewhere in [place].  Her treatment was initially given weekly.  Cycle 1 was complicated by neutropenia.  Cycle 2 was dose adjusted and delay.  Cycle 2 treatment was complicated by loss of appetite, severe diarrhea and persistent neutropenia.  Cycle 3 was delay as well.  She went to the emergency department twice for IV  fluid hydration and have lost a lot of weight.    12/18/2019 Imaging   CT chest, abdomen and pelvis 1. Interval resolution of the left supraclavicular, retroperitoneal, and right retrocrural lymphadenopathy seen on previous diagnostic CT and PET-CT. Tiny lymph nodes are seen in the retroperitoneal region today. 2. Tiny bilateral pulmonary nodules, indeterminate. These do not appear substantially changed although we do not have a prior diagnostic CT chest to compare. Previous PET-CT is a free breathing exam which obscure small pulmonary nodules. Attention on follow-up recommended. 3. Bilateral interstitial and patchy peripheral ground-glass opacity in both lungs, nonspecific but potentially related to infectious/inflammatory process or scarring. 4. 10 mm enhancing lesion lower pole right kidney, suspicious for neoplasm. MRI without and with contrast recommended to further evaluate. 5. Small hiatal hernia. 6. Enlargement of the pulmonary outflow tract and main pulmonary arteries compatible with pulmonary arterial hypertension.   03/09/2020 Imaging   1. No findings suspicious for metastatic disease in the chest, abdomen or pelvis. 2. Small pulmonary nodules are all stable and warrant continued chest CT follow-up. 3. Stable indeterminate exophytic 1.2 cm renal cortical lesion in the lateral lower left kidney, renal cell carcinoma not excluded. Dedicated MRI abdomen without and with IV contrast may be obtained for further evaluation as clinically warranted. 4. Chronic findings include:  Stable dilated main pulmonary artery, suggesting pulmonary arterial hypertension. Small hiatal hernia. Mild left colonic diverticulosis. Two-vessel coronary atherosclerosis. Aortic Atherosclerosis (ICD10-I70.0).     06/04/2020 Imaging   1. Interval progression of small retroperitoneal lymph nodes. While not technically enlarged by CT criteria, the interval progression is concerning. The enlargement of these nodes is more  apparent when comparing back to the older study from 12/18/2019 and they are now similar in size to the abnormal lymph nodes seen on the outside imaging of 12/03/2019. Close follow-up recommended. 2. Bilateral pulmonary nodules, similar to prior. The 6 mm right lower lobe nodule appears more confluent than on the prior study and is slightly increased from 5 mm when I remeasure on the previous exam. Close attention on follow-up recommended. 3. Small hiatal hernia. 4. Left colonic diverticulosis without diverticulitis. 5. Aortic Atherosclerosis (ICD10-I70.0).   08/31/2020 Imaging   1. Today's study is again concerning for progressive metastatic disease both with increased number and size of numerous small pulmonary nodules in the visualize lung bases, as well as increased number and size of numerous retroperitoneal lymph nodes, as detailed above. 2. Multiple cystic lesions in the kidneys bilaterally, favored to represent a combination of Bosniak class 1 and Bosniak class 2 cysts, as detailed above. 3. Colonic diverticulosis without evidence of acute diverticulitis at this time. 4. Cardiomegaly with left atrial dilatation. 5. Small hiatal hernia. 6. Aortic atherosclerosis.     09/15/2020 -  Chemotherapy   The patient had lenvatinib 20 mg daily dose (LENVIMA) 2 x 10 MG capsule, 10 mg (50 % of original dose 20 mg), Oral, Daily, 0 of 1 cycle, Start date: 09/02/2020, End date: -- Dose modification: 10 mg (original dose 20 mg, Cycle 0) pembrolizumab (KEYTRUDA) 200 mg in sodium chloride 0.9 % 50 mL chemo infusion, 200 mg, Intravenous, Once, 0 of 6 cycles  for chemotherapy treatment.      REVIEW OF SYSTEMS:   Constitutional: Denies fevers, chills or abnormal weight loss Eyes: Denies blurriness of vision Ears, nose, mouth, throat, and face: Denies mucositis or sore throat Respiratory: Denies cough, dyspnea or wheezes Cardiovascular: Denies palpitation, chest discomfort or lower extremity  swelling Gastrointestinal:  Denies nausea, heartburn or change in bowel habits Skin: Denies abnormal skin rashes Lymphatics: Denies new lymphadenopathy or easy bruising Neurological:Denies numbness, tingling or new weaknesses Behavioral/Psych: Mood is stable, no new changes  All other systems were reviewed with the patient and are negative.  I have reviewed the past medical history, past surgical history, social history and family history with the patient and they are unchanged from previous note.  ALLERGIES:  is allergic to verapamil.  MEDICATIONS:  Current Outpatient Medications  Medication Sig Dispense Refill  . amLODipine (NORVASC) 5 MG tablet Take 1 tablet (5 mg total) by mouth daily. 90 tablet 2  . aspirin EC 81 MG tablet Take 81 mg by mouth daily.    Marland Kitchen glipiZIDE (GLUCOTROL) 5 MG tablet Take 0.5 tablets by mouth 2 (two) times daily.    Marland Kitchen lenvatinib 20 mg daily dose (LENVIMA) 2 x 10 MG capsule Take 1 capsule (10 mg total) by mouth daily. 30 capsule 11  . lidocaine-prilocaine (EMLA) cream Apply 1 application topically as needed.    . lidocaine-prilocaine (EMLA) cream Apply to affected area once 30 g 3  . losartan (COZAAR) 100 MG tablet Take 100 mg by mouth daily.    . metoprolol succinate (TOPROL-XL) 100 MG 24 hr tablet Take 100 mg by mouth daily. Take with or immediately  following a meal.    . ondansetron (ZOFRAN) 8 MG tablet Take 1 tablet (8 mg total) by mouth every 8 (eight) hours as needed (Nausea or vomiting). 30 tablet 1  . prochlorperazine (COMPAZINE) 10 MG tablet Take 1 tablet (10 mg total) by mouth every 6 (six) hours as needed (Nausea or vomiting). 30 tablet 1  . rosuvastatin (CRESTOR) 10 MG tablet Take 1 tablet (10 mg total) by mouth daily. 90 tablet 2  . terazosin (HYTRIN) 2 MG capsule Take 1 capsule (2 mg total) by mouth at bedtime. 30 capsule 1   No current facility-administered medications for this visit.    PHYSICAL EXAMINATION: ECOG PERFORMANCE STATUS: 1 -  Symptomatic but completely ambulatory  Vitals:   09/01/20 1027  BP: (!) 162/84  Pulse: 61  Resp: 18  Temp: (!) 97.4 F (36.3 C)  SpO2: 98%   Filed Weights   09/01/20 1027  Weight: 165 lb 3.2 oz (74.9 kg)    GENERAL:alert, no distress and comfortable NEURO: alert & oriented x 3 with fluent speech, no focal motor/sensory deficits  LABORATORY DATA:  I have reviewed the data as listed    Component Value Date/Time   NA 137 08/31/2020 1022   K 4.0 08/31/2020 1022   CL 106 08/31/2020 1022   CO2 24 08/31/2020 1022   GLUCOSE 140 (H) 08/31/2020 1022   BUN 14 08/31/2020 1022   CREATININE 0.81 08/31/2020 1022   CALCIUM 10.6 (H) 08/31/2020 1022   PROT 6.9 08/31/2020 1022   ALBUMIN 4.0 08/31/2020 1022   AST 19 08/31/2020 1022   ALT 19 08/31/2020 1022   ALKPHOS 57 08/31/2020 1022   BILITOT 0.9 08/31/2020 1022   GFRNONAA >60 08/31/2020 1022   GFRAA >60 08/31/2020 1022    No results found for: SPEP, UPEP  Lab Results  Component Value Date   WBC 4.9 08/31/2020   NEUTROABS 3.5 08/31/2020   HGB 12.8 08/31/2020   HCT 38.1 08/31/2020   MCV 91.8 08/31/2020   PLT 211 08/31/2020      Chemistry      Component Value Date/Time   NA 137 08/31/2020 1022   K 4.0 08/31/2020 1022   CL 106 08/31/2020 1022   CO2 24 08/31/2020 1022   BUN 14 08/31/2020 1022   CREATININE 0.81 08/31/2020 1022      Component Value Date/Time   CALCIUM 10.6 (H) 08/31/2020 1022   ALKPHOS 57 08/31/2020 1022   AST 19 08/31/2020 1022   ALT 19 08/31/2020 1022   BILITOT 0.9 08/31/2020 1022       RADIOGRAPHIC STUDIES:I have reviewed multiple imaging studies with her I have personally reviewed the radiological images as listed and agreed with the findings in the report. CT ABDOMEN PELVIS W CONTRAST  Result Date: 08/31/2020 CLINICAL DATA:  69 year old female with history of uterine/cervical cancer. Last chemotherapy January 2021. Surveillance scan. EXAM: CT ABDOMEN AND PELVIS WITH CONTRAST TECHNIQUE:  Multidetector CT imaging of the abdomen and pelvis was performed using the standard protocol following bolus administration of intravenous contrast. CONTRAST:  137m OMNIPAQUE IOHEXOL 300 MG/ML  SOLN COMPARISON:  CT the abdomen and pelvis June 04, 2020. FINDINGS: Lower chest: Multiple small pulmonary nodules appear increased in number and size compared to the prior examination. The largest of these nodules is in the periphery of the right lower lobe (axial image 24 of series 7), currently measuring 9 x 7 mm (previously there is only 6 x 5 mm on 06/04/2020). Atherosclerotic calcifications in the descending thoracic  aorta. Mild calcifications of the mitral annulus. Cardiomegaly with left atrial dilatation. Small hiatal hernia. Hepatobiliary: No suspicious cystic or solid hepatic lesions. No intra or extrahepatic biliary ductal dilatation. Gallbladder is not visualized and presumably surgically absent. Pancreas: No pancreatic mass. No pancreatic ductal dilatation. No pancreatic or peripancreatic fluid collections or inflammatory changes. Spleen: Unremarkable. Adrenals/Urinary Tract: Multiple low-attenuation lesions in both kidneys, similar to the prior study, compatible with simple cysts, largest of which measures up to 3.1 x 2.4 cm in the upper pole the left kidney. In addition, in the anterior aspect of the interpolar region of the right kidney (axial image 32 of series 2) there is a intermediate attenuation lesion (28 HU) measuring 1.9 cm in diameter, stable in appearance compared to prior studies, favored to represent a small proteinaceous cyst. Likewise, and exophytic 1.2 cm intermediate attenuation (34 HU) lesion extends off the lateral aspect of the lower pole of the left kidney (axial image 39 of series 2), also similar to prior studies and favored to represent a proteinaceous cyst. Other subcentimeter low-attenuation lesions in both kidneys are too small to definitively characterize, but also favored to  represent tiny cysts. No hydroureteronephrosis. Urinary bladder is normal in appearance. Bilateral adrenal glands are normal in appearance. Stomach/Bowel: Normal appearance of the stomach. No pathologic dilatation of small bowel or colon. Numerous colonic diverticulae are noted, without surrounding inflammatory changes to suggest an acute diverticulitis at this time. Normal appendix. Vascular/Lymphatic: Aortic atherosclerosis, without evidence of aneurysm or dissection in the abdominal or pelvic vasculature. Again noted are numerous prominent borderline enlarged retroperitoneal lymph nodes which again appears slightly more prominent than the prior examination. An example of this is an aortocaval lymph node measuring 9 mm in short axis (axial image 32 of series 2) which previously measured 7 mm, and a new left para-aortic lymph node adjacent to the left renal vein measuring 9 mm in short axis (axial image 27 of series 2). No definite pelvic lymphadenopathy. Reproductive: Status post total abdominal hysterectomy and bilateral salpingo-oophorectomy. Other: No significant volume of ascites.  No pneumoperitoneum. Musculoskeletal: There are no aggressive appearing lytic or blastic lesions noted in the visualized portions of the skeleton. IMPRESSION: 1. Today's study is again concerning for progressive metastatic disease both with increased number and size of numerous small pulmonary nodules in the visualize lung bases, as well as increased number and size of numerous retroperitoneal lymph nodes, as detailed above. 2. Multiple cystic lesions in the kidneys bilaterally, favored to represent a combination of Bosniak class 1 and Bosniak class 2 cysts, as detailed above. 3. Colonic diverticulosis without evidence of acute diverticulitis at this time. 4. Cardiomegaly with left atrial dilatation. 5. Small hiatal hernia. 6. Aortic atherosclerosis. Electronically Signed   By: Vinnie Langton M.D.   On: 08/31/2020 14:06

## 2020-09-02 NOTE — Telephone Encounter (Signed)
Oral Oncology Pharmacist Encounter  Received new prescription for Lenvima (lenvatinib) for the treatment of recurrent uterine cancer in conjunction with pembrolizumab, planned duration until disease progression or unacceptable drug toxicity.  Prescription dose and frequency assessed for appropriateness. Appropriate for therapy initiation.   CMP and CBC w/ Diff from 08/31/20 assessed, labs OK for treatment initiation. Noted BP elevated at last office visit (162/84 mmHg) - recommend continued monitoring while on therapy. Patient has baseline TSH/T4 as well as urine dipstick scheduled for 09/15/20.  Current medication list in Epic reviewed, DDIs with Lenvima identified:  Category D DDI between [place] and Ondansetron for risk of QTc prolongation - noted ondansetron is only prescribed as needed, and patient has prochlorperazine as other option for N/V - will discuss use of antiemetics as needed with patient. No change in therapy warranted   Evaluated chart and no patient barriers to medication adherence noted.   Prescription has been e-scribed to the [facility] for benefits analysis and approval.  Oral Oncology Clinic will continue to follow for insurance authorization, copayment issues, initial counseling and start date.  Leron Croak, PharmD, BCPS Hematology/Oncology Clinical Pharmacist [facility] [phone] 09/02/2020 8:57 AM

## 2020-09-02 NOTE — Telephone Encounter (Signed)
Oral Oncology Patient Advocate Encounter  Prior Authorization for Michel [place] has been approved.    PA# [id number] Effective dates: 09/02/20 through 03/01/21  Patients co-pay is $3286.93  Oral Oncology Clinic will continue to follow.   [place] Patient [place] Phone [phone] Fax [phone] 09/02/2020 8:52 AM

## 2020-09-02 NOTE — Assessment & Plan Note (Signed)
She is aware her disease is incurable and goal of treatment is palliative

## 2020-09-02 NOTE — Assessment & Plan Note (Signed)
Her blood pressure control has improved at home She will continue close monitoring at home and medication adjustment through her primary care doctor and I warned her risk of uncontrolled hypertension while on Lenvima

## 2020-09-02 NOTE — Telephone Encounter (Signed)
Oral Oncology Patient Advocate Encounter  Received notification from [facility] that prior authorization for Sandra Arroyo is required.  PA submitted on CoverMyMeds Key [id number] Status is pending  Oral Oncology Clinic will continue to follow.  [place] Patient [name] Phone [phone] Fax [phone] 09/02/2020 8:18 AM

## 2020-09-02 NOTE — Assessment & Plan Note (Signed)
I have reviewed all imaging studies with her and family Unfortunately she has signs of disease recurrence Fortunately she is no symptomatic, yet Disease burden is still low   We reviewed the current guidelines Goal is palliative I recommend switching her treatment with Lenvima and pembrolizumab.   The combination was approved following review conducted under [employer], an intiative of the [place] which provides a framework for concurrent submission and review of oncology drugs among international partners. The FDA approved the combination with the [place] Therapeutic Goods Administration and [facility] [place].   Efficacy of the drugs together was investigated in Study 111/KEYNOTE-146 ([id number]), a single-arm, multicenter, open-label, multi-cohort trial that enrolled 108 patients with metastatic endometrial cancer that had progressed following at least one prior systemic therapy in any setting.  Patients took 20 mg of lenvatinib orally once daily in combination with 200 mg of pembrolizumab administered intravenously every 3 weeks until unacceptable toxicity or disease progression.  Among the 108 patients, 94 had tumors that were not MSI-H or dMMR, 11 had tumors that were MSI-H or dMMR, and in 3 patients the tumor MSI-H or dMMR status was not known.  Results  The major efficacy outcome measures were objective response rate (ORR) and duration of response (DOR) by independent radiologic review committee using RECIST 1.1. The ORR in the 94 patients whose tumors were not MSI-H or dMMR was 38.3% (95% CI: 29%, 49%) with 10 complete responses (10.6%) and 26 partial responses (27.7%). Median DOR was not reached at the time of data cutoff and 25 patients (69% of responders) had response durations ?6 months.  In another updated publication published on JCO: DOI: 10.1200/JCO.19.02627 Journal of Clinical Oncology, Published online February 08, 2019. Lenvatinib Plus Pembrolizumab in  Patients With Advanced Endometrial Cancer Abstract PURPOSE  Patients with advanced endometrial carcinoma have limited treatment options. We report final primary efficacy analysis results for a patient cohort with advanced endometrial carcinoma receiving lenvatinib plus pembrolizumab in an ongoing phase Ib/II study of selected solid tumors. METHODS  Patients took lenvatinib 20 mg once daily orally plus pembrolizumab 200 mg intravenously once every 3 weeks, in 3-week cycles. The primary end point was objective response rate (ORR) at 24 weeks ([id number]); secondary efficacy end points included duration of response (DOR), progression-free survival (PFS), and overall survival (OS). Tumor assessments were evaluated by investigators per immune-related RECIST.  RESULTS  At data cutoff, 108 patients with previously treated endometrial carcinoma were enrolled, with a median follow-up of 18.7 months. The RPRXY58 was 38.0% (95% CI, 28.8% to 47.8%). Among subgroups, the PFYTW44 (95% CI) was 63.6% (30.8% to 89.1%) in patients with microsatellite instability (MSI)-high tumors (n = 11) and 36.2% (26.5% to 46.7%) in patients with microsatellite-stable tumors (n = 94). For previously treated patients, regardless of tumor MSI status, the median DOR was 21.2 months (95% CI, 7.6 months to not estimable), median PFS was 7.4 months (95% CI, 5.3 to 8.7 months), and median OS was 16.7 months (15.0 months to not estimable). Grade 3 or 4 treatment-related adverse events occurred in 83/124 (66.9%) patients. CONCLUSION  Lenvatinib plus pembrolizumab showed promising antitumor activity in patients with advanced endometrial carcinoma who have experienced disease progression after prior systemic therapy, regardless of tumor MSI status. The combination therapy had a manageable toxicity profile. The most common adverse reactions for endometrial cancer were fatigue, hypertension, musculoskeletal pain, diarrhea, decreased appetite,  hypothyroidism, nausea, stomatitis, vomiting, decreased weight, abdominal pain, headache, constipation, urinary tract infection, dysphonia, hemorrhagic events, hypomagnesemia, palmar-plantar  erythrodysesthesia, dyspnea, cough, and rash.   The most common adverse reactions for endometrial cancer were fatigue, hypertension, musculoskeletal pain, diarrhea, decreased appetite, hypothyroidism, nausea, stomatitis, vomiting, decreased weight, abdominal pain, headache, constipation, urinary tract infection, dysphonia, hemorrhagic events, hypomagnesemia, palmar-plantar erythrodysesthesia, dyspnea, cough, and rash.   After much discussion, the patient would like to proceed with the plan of care. Due to history of profound pancytopenia with chemo, even though her counts are ok now, I plan to start her with intermediate dose of Lenvima at 10 mg daily We will start Rx in 2 weeks 

## 2020-09-09 NOTE — Progress Notes (Signed)
Pharmacist Chemotherapy Monitoring - Initial Assessment    Anticipated start date: 09/15/2020   Regimen:  . Are orders appropriate based on the patient's diagnosis, regimen, and cycle? Yes . Does the plan date match the patient's scheduled date? Yes . Is the sequencing of drugs appropriate? Yes . Are the premedications appropriate for the patient's regimen? Yes . Prior Authorization for treatment is:Not started  o If applicable, is the correct biosimilar selected based on the patient's insurance? not applicable  Organ Function and Labs: Marland Kitchen Are dose adjustments needed based on the patient's renal function, hepatic function, or hematologic function? Yes . Are appropriate labs ordered prior to the start of patient's treatment? Yes . Other organ system assessment, if indicated: N/A The following baseline labs, if indicated, have been ordered: keytruda TSH  Dose Assessment: . Are the drug doses appropriate? Yes . Are the following correct: o Drug concentrations Yes o IV fluid compatible with drug Yes o Administration routes Yes o Timing of therapy Yes . If applicable, does the patient have documented access for treatment and/or plans for port-a-cath placement? not applicable . If applicable, have lifetime cumulative doses been properly documented and assessed? no Lifetime Dose Tracking  No doses have been documented on this patient for the following tracked chemicals: Doxorubicin, Epirubicin, Idarubicin, Daunorubicin, Mitoxantrone, Bleomycin, Oxaliplatin, Carboplatin, Liposomal Doxorubicin  o   Toxicity Monitoring/Prevention: . The patient has the following take home antiemetics prescribed: Ondansetron and Prochlorperazine . The patient has the following take home medications prescribed: N/A . Medication allergies and previous infusion related reactions, if applicable, have been reviewed and addressed. No . The patient's current medication list has been assessed for drug-drug interactions  with their chemotherapy regimen. no significant drug-drug interactions were identified on review.  Order Review: . Are the treatment plan orders signed? Yes . Is the patient scheduled to see a provider prior to their treatment? Yes  I verify that I have reviewed each item in the above checklist and answered each question accordingly.  Sandra Arroyo D 09/09/2020 3:21 PM

## 2020-09-10 ENCOUNTER — Telehealth: Payer: Self-pay

## 2020-09-10 NOTE — Telephone Encounter (Signed)
Oral Oncology Patient Advocate Encounter  Met patient in lobby to complete application for [employer] in an effort to reduce patient's out of pocket expense for Lenvima to $0.    Application completed and faxed to [phone].   [employer] patient assistance phone number for follow up is [phone].   This encounter will be updated until final determination.   Sandra Arroyo Patient [place] Phone [phone] Fax [phone] 09/10/2020 1:44 PM

## 2020-09-11 NOTE — Telephone Encounter (Addendum)
Oral Chemotherapy Pharmacist Encounter  I spoke with patient for overview of: Lenvima (lenvatinib) for the treatment of recurrent uterine cancer in conjunction with pembrolizumab, planned duration until disease progression or unacceptable toxicity.   Counseled patient on administration, dosing, side effects, monitoring, drug-food interactions, safe handling, storage, and disposal.  Patient will take Lenvima 10mg  capsules, 1 capsule (10mg ) by mouth once daily, with or without food, at approximately the same time each day.  Patient will receive pembrolizumab 200 mg IV every 21 days  Lenvima and pembrolizumab start date: 09/15/20  Adverse effects include but are not limited to: hypertension, hand-foot syndrome, diarrhea, joint pain, fatigue, headache, proteinuria, increased risk of blood clots, and cardiac conduction issues.    Patient will obtain anti diarrheal and alert the office of 4 or more loose stools above baseline.  Patient instructed to notify office of any upcoming invasive procedures.  Michel Santee will be held for 6 days prior to scheduled surgery, restart based on healing and clinical judgement.   Reviewed with patient importance of keeping a medication schedule and plan for any missed doses. No barriers to medication adherence identified.  Medication reconciliation performed and medication/allergy list updated.  Insurance authorization for [employer]. Out of pocket copay of [phone] is unaffordable for patient. Manufacturer assistance for [place] applied for on patient's behalf to help cover cost of drug through [employer].   All questions answered.  Ms. Peto voiced understanding and appreciation.   Will follow up with patient on 09/15/20 in clinic to provide medication education handout and answer any further questions she may have about Lenvima. Patient knows to call the office with questions or concerns. Oral Chemotherapy Clinic phone number provided to  patient.   Leron Croak, PharmD, BCPS Hematology/Oncology Clinical Pharmacist [facility] [phone] 09/11/2020 10:25 AM

## 2020-09-15 ENCOUNTER — Telehealth: Payer: Self-pay

## 2020-09-15 ENCOUNTER — Inpatient Hospital Stay: Payer: Medicare Other

## 2020-09-15 ENCOUNTER — Other Ambulatory Visit: Payer: Self-pay

## 2020-09-15 ENCOUNTER — Inpatient Hospital Stay (HOSPITAL_BASED_OUTPATIENT_CLINIC_OR_DEPARTMENT_OTHER): Payer: Medicare Other | Admitting: Hematology and Oncology

## 2020-09-15 ENCOUNTER — Encounter: Payer: Self-pay | Admitting: Hematology and Oncology

## 2020-09-15 ENCOUNTER — Other Ambulatory Visit: Payer: Self-pay | Admitting: Hematology and Oncology

## 2020-09-15 DIAGNOSIS — C55 Malignant neoplasm of uterus, part unspecified: Secondary | ICD-10-CM

## 2020-09-15 DIAGNOSIS — I10 Essential (primary) hypertension: Secondary | ICD-10-CM | POA: Diagnosis not present

## 2020-09-15 DIAGNOSIS — E213 Hyperparathyroidism, unspecified: Secondary | ICD-10-CM

## 2020-09-15 DIAGNOSIS — Z95828 Presence of other vascular implants and grafts: Secondary | ICD-10-CM

## 2020-09-15 DIAGNOSIS — Z5112 Encounter for antineoplastic immunotherapy: Secondary | ICD-10-CM | POA: Diagnosis not present

## 2020-09-15 LAB — CMP (CANCER CENTER ONLY)
ALT: 15 U/L (ref 0–44)
AST: 14 U/L — ABNORMAL LOW (ref 15–41)
Albumin: 3.9 g/dL (ref 3.5–5.0)
Alkaline Phosphatase: 56 U/L (ref 38–126)
Anion gap: 3 — ABNORMAL LOW (ref 5–15)
BUN: 18 mg/dL (ref 8–23)
CO2: 27 mmol/L (ref 22–32)
Calcium: 10.5 mg/dL — ABNORMAL HIGH (ref 8.9–10.3)
Chloride: 108 mmol/L (ref 98–111)
Creatinine: 0.83 mg/dL (ref 0.44–1.00)
GFR, Estimated: >60 mL/min (ref >60)
Glucose, Bld: 171 mg/dL — ABNORMAL HIGH (ref 70–99)
Potassium: 4 mmol/L (ref 3.5–5.1)
Sodium: 138 mmol/L (ref 135–145)
Total Bilirubin: 0.6 mg/dL (ref 0.3–1.2)
Total Protein: 6.6 g/dL (ref 6.5–8.1)

## 2020-09-15 LAB — CBC WITH DIFFERENTIAL (CANCER CENTER ONLY)
Abs Immature Granulocytes: 0.03 10*3/uL (ref 0.00–0.07)
Basophils Absolute: 0.1 10*3/uL (ref 0.0–0.1)
Basophils Relative: 1 %
Eosinophils Absolute: 0 10*3/uL (ref 0.0–0.5)
Eosinophils Relative: 1 %
HCT: 36.8 % (ref 36.0–46.0)
Hemoglobin: 12.5 g/dL (ref 12.0–15.0)
Immature Granulocytes: 1 %
Lymphocytes Relative: 15 %
Lymphs Abs: 0.8 10*3/uL (ref 0.7–4.0)
MCH: 31.7 pg (ref 26.0–34.0)
MCHC: 34 g/dL (ref 30.0–36.0)
MCV: 93.4 fL (ref 80.0–100.0)
Monocytes Absolute: 0.4 10*3/uL (ref 0.1–1.0)
Monocytes Relative: 7 %
Neutro Abs: 4.1 10*3/uL (ref 1.7–7.7)
Neutrophils Relative %: 75 %
Platelet Count: 217 10*3/uL (ref 150–400)
RBC: 3.94 MIL/uL (ref 3.87–5.11)
RDW: 12.3 % (ref 11.5–15.5)
WBC Count: 5.4 10*3/uL (ref 4.0–10.5)
nRBC: 0 % (ref 0.0–0.2)

## 2020-09-15 LAB — TSH: TSH: 0.737 u[IU]/mL (ref 0.308–3.960)

## 2020-09-15 LAB — TOTAL PROTEIN, URINE DIPSTICK: Protein, ur: NEGATIVE mg/dL

## 2020-09-15 MED ORDER — HEPARIN SOD (PORK) LOCK FLUSH 100 UNIT/ML IV SOLN
500.0000 [IU] | Freq: Once | INTRAVENOUS | Status: AC | PRN
  Administered 2020-09-15: 500 [IU]
  Filled 2020-09-15: qty 5

## 2020-09-15 MED ORDER — SODIUM CHLORIDE 0.9 % IV SOLN
Freq: Once | INTRAVENOUS | Status: AC
  Filled 2020-09-15: qty 250

## 2020-09-15 MED ORDER — CLONAZEPAM 0.5 MG PO TABS: 0.5000 mg | ORAL_TABLET | Freq: Every day | ORAL | 4 refills | Status: DC

## 2020-09-15 MED ORDER — SODIUM CHLORIDE 0.9% FLUSH
10.0000 mL | INTRAVENOUS | Status: DC | PRN
  Administered 2020-09-15: 10 mL
  Filled 2020-09-15: qty 10

## 2020-09-15 MED ORDER — SODIUM CHLORIDE 0.9 % IV SOLN
200.0000 mg | Freq: Once | INTRAVENOUS | Status: AC
  Administered 2020-09-15: 200 mg via INTRAVENOUS
  Filled 2020-09-15: qty 8

## 2020-09-15 MED FILL — clonazePAM 0.5 MG TABS: 0.5 | 30 days supply | Qty: 30 | Fill #0

## 2020-09-15 NOTE — Assessment & Plan Note (Signed)
I have reviewed the plan of care with the patient and her sister today We will start her on pembrolizumab with Lenvima I warned the patient about risk of uncontrolled hypertension while on Lenvima I will call her early next week for further follow-up on blood pressure control at home The plan would be to continue treatment for about 3 months before repeating imaging study for objective assessment of response to therapy

## 2020-09-15 NOTE — Telephone Encounter (Signed)
Patient is approved for Lenvima at no charge through [facility] 09/15/20-11/27/20.

## 2020-09-15 NOTE — Assessment & Plan Note (Signed)
She has hyperparathyroidism with chronic hypercalcemia She is not symptomatic Observe for now

## 2020-09-15 NOTE — Assessment & Plan Note (Signed)
Her blood pressure is elevated today, likely contributed by anxiety She will start lenvatinib She is instructed to check her blood pressure twice a day and we will call her next week to assess blood pressure control at home

## 2020-09-15 NOTE — Telephone Encounter (Signed)
Called per Dr. Alvy Bimler and instructed to start the [facility] today. She verbalzied understanding.

## 2020-09-15 NOTE — Patient Instructions (Addendum)
[facility] Discharge Instructions for Patients Receiving Chemotherapy  Today you received the following chemotherapy agents Keytruda  To help prevent nausea and vomiting after your treatment, we encourage you to take your nausea medication as directed   If you develop nausea and vomiting that is not controlled by your nausea medication, call the clinic.   BELOW ARE SYMPTOMS THAT SHOULD BE REPORTED IMMEDIATELY:  *FEVER GREATER THAN 100.5 F  *CHILLS WITH OR WITHOUT FEVER  NAUSEA AND VOMITING THAT IS NOT CONTROLLED WITH YOUR NAUSEA MEDICATION  *UNUSUAL SHORTNESS OF BREATH  *UNUSUAL BRUISING OR BLEEDING  TENDERNESS IN MOUTH AND THROAT WITH OR WITHOUT PRESENCE OF ULCERS  *URINARY PROBLEMS  *BOWEL PROBLEMS  UNUSUAL RASH Items with * indicate a potential emergency and should be followed up as soon as possible.  Feel free to call the clinic should you have any questions or concerns. The clinic phone number is [phone].  Please show the CHEMO ALERT CARD at check-in to the Emergency Department and triage nurse.  Pembrolizumab injection What is this medicine? PEMBROLIZUMAB (pem broe liz ue mab) is a monoclonal antibody. It is used to treat certain types of cancer. This medicine may be used for other purposes; ask your health care provider or pharmacist if you have questions. COMMON BRAND NAME(S): Keytruda What should I tell my health care provider before I take this medicine? They need to know if you have any of these conditions:  diabetes  immune system problems  inflammatory bowel disease  liver disease  lung or breathing disease  lupus  received or scheduled to receive an organ transplant or a stem-cell transplant that uses donor stem cells  an unusual or allergic reaction to pembrolizumab, other medicines, foods, dyes, or preservatives  pregnant or trying to get pregnant  breast-feeding How should I use this medicine? This medicine is for  infusion into a vein. It is given by a health care professional in a hospital or clinic setting. A special MedGuide will be given to you before each treatment. Be sure to read this information carefully each time. Talk to your pediatrician regarding the use of this medicine in children. While this drug may be prescribed for children as young as 6 months for selected conditions, precautions do apply. Overdosage: If you think you have taken too much of this medicine contact a poison control center or emergency room at once. NOTE: This medicine is only for you. Do not share this medicine with others. What if I miss a dose? It is important not to miss your dose. Call your doctor or health care professional if you are unable to keep an appointment. What may interact with this medicine? Interactions have not been studied. Give your health care provider a list of all the medicines, herbs, non-prescription drugs, or dietary supplements you use. Also tell them if you smoke, drink alcohol, or use illegal drugs. Some items may interact with your medicine. This list may not describe all possible interactions. Give your health care provider a list of all the medicines, herbs, non-prescription drugs, or dietary supplements you use. Also tell them if you smoke, drink alcohol, or use illegal drugs. Some items may interact with your medicine. What should I watch for while using this medicine? Your condition will be monitored carefully while you are receiving this medicine. You may need blood work done while you are taking this medicine. Do not become pregnant while taking this medicine or for 4 months after stopping it. Women should   inform their doctor if they wish to become pregnant or think they might be pregnant. There is a potential for serious side effects to an unborn child. Talk to your health care professional or pharmacist for more information. Do not breast-feed an infant while taking this medicine or for 4  months after the last dose. What side effects may I notice from receiving this medicine? Side effects that you should report to your doctor or health care professional as soon as possible:  allergic reactions like skin rash, itching or hives, swelling of the face, lips, or tongue  bloody or black, tarry  breathing problems  changes in vision  chest pain  chills  confusion  constipation  cough  diarrhea  dizziness or feeling faint or lightheaded  fast or irregular heartbeat  fever  flushing  joint pain  low blood counts - this medicine may decrease the number of white blood cells, red blood cells and platelets. You may be at increased risk for infections and bleeding.  muscle pain  muscle weakness  pain, tingling, numbness in the hands or feet  persistent headache  redness, blistering, peeling or loosening of the skin, including inside the mouth  signs and symptoms of high blood sugar such as dizziness; dry mouth; dry skin; fruity breath; nausea; stomach pain; increased hunger or thirst; increased urination  signs and symptoms of kidney injury like trouble passing urine or change in the amount of urine  signs and symptoms of liver injury like dark urine, light-colored stools, loss of appetite, nausea, right upper belly pain, yellowing of the eyes or skin  sweating  swollen lymph nodes  weight loss Side effects that usually do not require medical attention (report to your doctor or health care professional if they continue or are bothersome):  decreased appetite  hair loss  muscle pain  tiredness This list may not describe all possible side effects. Call your doctor for medical advice about side effects. You may report side effects to FDA at 1-800-FDA-1088. Where should I keep my medicine? This drug is given in a hospital or clinic and will not be stored at home. NOTE: This sheet is a summary. It may not cover all possible information. If you have  questions about this medicine, talk to your doctor, pharmacist, or health care provider.  2020 Elsevier/Gold Standard (2019-09-20 18:07:58)  

## 2020-09-15 NOTE — Progress Notes (Signed)
[facility] OFFICE PROGRESS NOTE  Patient Care Team: [name], DO as PCP - General (Family Medicine)  ASSESSMENT & PLAN:  Uterine cancer [facility]) I have reviewed the plan of care with the patient and her sister today We will start her on pembrolizumab with Lenvima I warned the patient about risk of uncontrolled hypertension while on Lenvima I will call her early next week for further follow-up on blood pressure control at home The plan would be to continue treatment for about 3 months before repeating imaging study for objective assessment of response to therapy  Essential hypertension Her blood pressure is elevated today, likely contributed by anxiety She will start lenvatinib She is instructed to check her blood pressure twice a day and we will call her next week to assess blood pressure control at home  Hyperparathyroidism, unspecified ([place]) She has hyperparathyroidism with chronic hypercalcemia She is not symptomatic Observe for now   No orders of the defined types were placed in this encounter.   All questions were answered. The patient knows to call the clinic with any problems, questions or concerns. The total time spent in the appointment was 20 minutes encounter with patients including review of chart and various tests results, discussions about plan of care and coordination of care plan   Heath Lark, MD 09/15/2020 3:22 PM  INTERVAL HISTORY: Please see below for problem oriented charting. 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   01/18/2018 Imaging   CT scan of the chest, abdomen and pelvis elsewhere show lung nodules, kidney lesion as well as 1.5 x 1 cm left, iliac lymph node   01/23/2018 - 06/12/2018 Chemotherapy   The patient had adjuvant chemotherapy with carboplatin and taxol elsewhere for chemotherapy treatment.     06/25/2018 Imaging   CT scan of the chest, abdomen and pelvis showed persistent lung nodules, kidney lesion but reduced size of previously noted pelvic lymphadenopathy to 6 x 4 mm   07/09/2018 - 08/17/2018 Radiation Therapy   She had daily radiation treatment for 5 weeks elsewhere in [place], 28 days treatment, 5040 cGy   09/25/2018 Imaging   CT scan of the chest, abdomen and pelvis shows stable lung nodule and kidney lesion.  Previously noted enlarged lymphadenopathy in the external iliac region has resolved   01/16/2019 Imaging   CT imaging of the chest, abdomen and pelvis elsewhere showed stable kidney lesions but no evidence of cancer recurrence   05/16/2019 Imaging   Outside CT scan showed stable pulmonary nodules.  Bilateral simple renal cyst with an exophytic lesion in the medial cortex of the lower pole of the right kidney measure 10 mm with enhancement postcontrast.  There is 6 mm lymph node in the left para-aortic region.  There is an enlarging hyper enhancing aortocaval lymph node measuring  5 mm.   08/27/2019 PET scan   There is left supraclavicular lymph node measure 7 mm, another additional supraclavicular region 9 mm with SUV of 8.3.  There is abnormal uptake in retrocrural lymph nodes, SUV 3.7.  There is elevated FDG uptake in the patient's known retroperitoneal lymph nodes.  There is a small solid-appearing right  inferior medial renal lesion with no FDG uptake.   09/13/2019 Procedure   She underwent biopsy of 2 left supraclavicular lymph node elsewhere   09/13/2019 Pathology Results   Immunohistochemical stains performed on the biopsy report showed that the tumor cells are strongly and diffusely positive for p16, PAX 8, p53, CK7, pancytokeratin and beta-catenin.  CA-125, CK20 and TTF-1 are negative.  Ki-67 stains approximately 50% of tumor cells.  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   10/09/2019 -  Chemotherapy   The patient had docetaxel and carboplatin for chemotherapy treatment elsewhere in [place].  Her treatment was initially given weekly.  Cycle 1 was complicated by neutropenia.  Cycle 2 was dose adjusted and delay.  Cycle 2 treatment was complicated by loss of appetite, severe diarrhea and persistent neutropenia.  Cycle 3 was delay as well.  She went to the emergency department twice for IV fluid hydration and have lost a lot of weight.    12/18/2019 Imaging   CT chest, abdomen and pelvis 1. Interval resolution of the left supraclavicular, retroperitoneal, and right retrocrural lymphadenopathy seen on previous diagnostic CT and PET-CT. Tiny lymph nodes are seen in the retroperitoneal region today. 2. Tiny bilateral pulmonary nodules, indeterminate. These do not appear substantially changed although we do not have a prior diagnostic CT chest to compare. Previous PET-CT is a free breathing exam which obscure small pulmonary nodules. Attention on follow-up recommended. 3. Bilateral interstitial and patchy peripheral ground-glass opacity in both lungs, nonspecific but potentially related to infectious/inflammatory process or scarring. 4. 10 mm enhancing lesion lower pole right kidney, suspicious for neoplasm. MRI without and with contrast recommended to further evaluate. 5. Small hiatal hernia. 6. Enlargement of the  pulmonary outflow tract and main pulmonary arteries compatible with pulmonary arterial hypertension.   03/09/2020 Imaging   1. No findings suspicious for metastatic disease in the chest, abdomen or pelvis. 2. Small pulmonary nodules are all stable and warrant continued chest CT follow-up. 3. Stable indeterminate exophytic 1.2 cm renal cortical lesion in the lateral lower left kidney, renal cell carcinoma not excluded. Dedicated MRI abdomen without and with IV contrast may be obtained for further evaluation as clinically warranted. 4. Chronic findings include: Stable dilated main pulmonary artery, suggesting pulmonary arterial hypertension. Small hiatal hernia. Mild left colonic diverticulosis. Two-vessel coronary atherosclerosis. Aortic Atherosclerosis (ICD10-I70.0).     06/04/2020 Imaging   1. Interval progression of small retroperitoneal lymph nodes. While not technically enlarged by CT criteria, the interval progression is concerning. The enlargement of these nodes is more apparent when comparing back to the older study from 12/18/2019 and they are now similar in size to the abnormal lymph nodes seen on the outside imaging of 12/03/2019. Close follow-up recommended. 2. Bilateral pulmonary nodules, similar to prior. The 6 mm right lower lobe nodule appears more confluent than on the prior study and is slightly increased from 5 mm when I remeasure on the previous exam. Close attention on follow-up recommended. 3. Small hiatal hernia. 4. Left colonic diverticulosis without diverticulitis. 5. Aortic Atherosclerosis (ICD10-I70.0).   08/31/2020 Imaging   1. Today's study is  again concerning for progressive metastatic disease both with increased number and size of numerous small pulmonary nodules in the visualize lung bases, as well as increased number and size of numerous retroperitoneal lymph nodes, as detailed above. 2. 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 3. Colonic diverticulosis without evidence of acute diverticulitis at this time. 4. Cardiomegaly with left atrial dilatation. 5. Small hiatal hernia. 6. Aortic atherosclerosis.     09/15/2020 -  Chemotherapy   The patient had lenvatinib 20 mg daily dose (LENVIMA) 2 x 10 MG capsule, 10 mg (50 % of original dose 20 mg), Oral, Daily, 1 of 1 cycle, Start date: 09/02/2020, End date: 09/02/2020 Dose modification: 10 mg (original dose 20 mg, Cycle 0) pembrolizumab (KEYTRUDA) 200 mg in sodium chloride 0.9 % 50 mL chemo infusion, 200 mg, Intravenous, Once, 1 of 6 cycles Administration: 200 mg (09/15/2020)  for chemotherapy treatment.      REVIEW OF SYSTEMS:   Constitutional: Denies fevers, chills or abnormal weight loss Eyes: Denies blurriness of vision Ears, nose, mouth, throat, and face: Denies mucositis or sore throat Respiratory: Denies cough, dyspnea or wheezes Cardiovascular: Denies palpitation, chest discomfort or lower extremity swelling Gastrointestinal:  Denies nausea, heartburn or change in bowel habits Skin: Denies abnormal skin rashes Lymphatics: Denies new lymphadenopathy or easy bruising Neurological:Denies numbness, tingling or new weaknesses Behavioral/Psych: Mood is stable, no new changes  All other systems were reviewed with the patient and are negative.  I have reviewed the past medical history, past surgical history, social history and family history with the patient and they are unchanged from previous note.  ALLERGIES:  is allergic to verapamil.  MEDICATIONS:  Current Outpatient Medications  Medication Sig Dispense Refill  . amLODipine (NORVASC) 5 MG tablet Take 1 tablet (5 mg total) by mouth daily. 90 tablet 2  . aspirin EC 81 MG tablet Take 81 mg by mouth daily.    . clonazePAM (KLONOPIN) 0.5 MG tablet Take 1 tablet (0.5 mg total) by mouth at bedtime. 30 tablet 4  . glipiZIDE (GLUCOTROL)  5 MG tablet Take 0.5 tablets by mouth 2 (two) times daily.    Marland Kitchen lenvatinib 10 mg daily dose (LENVIMA, 10 MG DAILY DOSE,) capsule Take 1 capsule (10 mg total) by mouth daily. 30 capsule 11  . lidocaine-prilocaine (EMLA) cream Apply 1 application topically as needed.    . lidocaine-prilocaine (EMLA) cream Apply to affected area once 30 g 3  . losartan (COZAAR) 100 MG tablet Take 100 mg by mouth daily.    . metoprolol succinate (TOPROL-XL) 100 MG 24 hr tablet Take 100 mg by mouth daily. Take with or immediately following a meal.    . ondansetron (ZOFRAN) 8 MG tablet Take 1 tablet (8 mg total) by mouth every 8 (eight) hours as needed (Nausea or vomiting). 30 tablet 1  . prochlorperazine (COMPAZINE) 10 MG tablet Take 1 tablet (10 mg total) by mouth every 6 (six) hours as needed (Nausea or vomiting). 30 tablet 1  . rosuvastatin (CRESTOR) 10 MG tablet Take 1 tablet (10 mg total) by mouth daily. 90 tablet 2  . terazosin (HYTRIN) 2 MG capsule Take 1 capsule (2 mg total) by mouth at bedtime. 30 capsule 1   No current facility-administered medications for this visit.   Facility-Administered Medications Ordered in Other Visits  Medication Dose Route Frequency Provider Last Rate Last Admin  . sodium chloride flush (  NS) 0.9 % injection 10 mL  10 mL Intracatheter PRN Alvy Bimler, Declynn Lopresti, MD   10 mL at 09/15/20 1359    PHYSICAL EXAMINATION: ECOG PERFORMANCE STATUS: 1 - Symptomatic but completely ambulatory  Vitals:   09/15/20 1134  BP: (!) 168/76  Pulse: 64  Resp: 18  Temp: (!) 97 F (36.1 C)  SpO2: 97%   Filed Weights   09/15/20 1134  Weight: 163 lb (73.9 kg)    GENERAL:alert, no distress and comfortable NEURO: alert & oriented x 3 with fluent speech, no focal motor/sensory deficits  LABORATORY DATA:  I have reviewed the data as listed    Component Value Date/Time   NA 138 09/15/2020 1128   K 4.0 09/15/2020 1128   CL 108 09/15/2020 1128   CO2 27 09/15/2020 1128   GLUCOSE 171 (H) 09/15/2020  1128   BUN 18 09/15/2020 1128   CREATININE 0.83 09/15/2020 1128   CALCIUM 10.5 (H) 09/15/2020 1128   PROT 6.6 09/15/2020 1128   ALBUMIN 3.9 09/15/2020 1128   AST 14 (L) 09/15/2020 1128   ALT 15 09/15/2020 1128   ALKPHOS 56 09/15/2020 1128   BILITOT 0.6 09/15/2020 1128   GFRNONAA >60 09/15/2020 1128   GFRAA >60 08/31/2020 1022    No results found for: SPEP, UPEP  Lab Results  Component Value Date   WBC 5.4 09/15/2020   NEUTROABS 4.1 09/15/2020   HGB 12.5 09/15/2020   HCT 36.8 09/15/2020   MCV 93.4 09/15/2020   PLT 217 09/15/2020      Chemistry      Component Value Date/Time   NA 138 09/15/2020 1128   K 4.0 09/15/2020 1128   CL 108 09/15/2020 1128   CO2 27 09/15/2020 1128   BUN 18 09/15/2020 1128   CREATININE 0.83 09/15/2020 1128      Component Value Date/Time   CALCIUM 10.5 (H) 09/15/2020 1128   ALKPHOS 56 09/15/2020 1128   AST 14 (L) 09/15/2020 1128   ALT 15 09/15/2020 1128   BILITOT 0.6 09/15/2020 1128

## 2020-09-16 ENCOUNTER — Telehealth: Payer: Self-pay

## 2020-09-16 LAB — T4: T4, Total: 8.2 ug/dL (ref 4.5–12.0)

## 2020-09-16 NOTE — Telephone Encounter (Signed)
Ann called and left a message. Sandra Arroyo's BP was 190/90 last pm and today 170/80.  Called back and spoke with Santiago Glad. Instructed to take bp in the am and pm. Record the readings and follow a low salt diet. Told her I would give above message to Dr. Alvy Bimler and the office would call her tomorrow. She verbalized understanding.

## 2020-09-17 MED ORDER — AMLODIPINE BESYLATE 10 MG PO TABS: 10.0000 mg | ORAL_TABLET | Freq: Every day | ORAL | 1 refills | Status: DC

## 2020-09-17 MED ORDER — HYDROCHLOROTHIAZIDE 25 MG PO TABS: 25.0000 mg | ORAL_TABLET | Freq: Every day | ORAL | 1 refills | Status: DC

## 2020-09-17 NOTE — Telephone Encounter (Signed)
Sandra Arroyo,  I recommend she increase her amlodipine to 10 mg, we can send in 10 mg pills if she is low on her current 5 mg I also recommend adding HCTZ 25 mg daily in the morning I am not sure which pharmacy she is using. Please e-scribe 30 days with 1 refill Thanks

## 2020-09-17 NOTE — Telephone Encounter (Signed)
RN notified of MD's recommendations as below.  Pt verbalized understanding and agreement.  Rx sent to pharmacy, per patient request [place], [place].   RN educated patient to continue to monitor blood pressure daily, notify clinic of any dizziness, headaches, or chest pain.  Pt verbalized understanding.

## 2020-09-17 NOTE — Addendum Note (Signed)
Addended by: Rennis Harding on: 09/17/2020 10:54 AM   Modules accepted: Orders

## 2020-09-21 ENCOUNTER — Other Ambulatory Visit: Payer: Self-pay

## 2020-09-21 ENCOUNTER — Other Ambulatory Visit: Payer: Self-pay | Admitting: Hematology and Oncology

## 2020-09-21 ENCOUNTER — Telehealth: Payer: Self-pay

## 2020-09-21 MED ORDER — OXYCODONE HCL 5 MG PO TABS: 5.0000 mg | ORAL_TABLET | ORAL | 0 refills | Status: DC | PRN

## 2020-09-21 MED ORDER — HYDRALAZINE HCL 25 MG PO TABS: 25.0000 mg | ORAL_TABLET | Freq: Three times a day (TID) | ORAL | 0 refills | Status: DC

## 2020-09-21 NOTE — Telephone Encounter (Signed)
Can you verify which her pharmacy? 1) add hydralazine 25 mg TID 2) Advil can cause fluid retention and worsening BP. If she needs something stronger than tylenol I can prescribe pain medicine 3) I can see her next Monday to assess BP and the LN.

## 2020-09-21 NOTE — Telephone Encounter (Signed)
Called and left below message. Ask her to call the office for questions. ?

## 2020-09-21 NOTE — Telephone Encounter (Signed)
I sent oxycodone to her pharmacy Warn her about risk of constipation and sedation

## 2020-09-21 NOTE — Telephone Encounter (Signed)
Called back and given below message. She verbalzied understanding. Friday am blood pressure 168/90 and pm 180/90 Saturday am bp- 175/85 and pm 180/95 Sunday am bp- 136/79 and pm 179/95 Monday am bp 168/94 She started the HCTZ Rx on Saturday.  She is complaining of abdominal pain at times and is taking advil for the pain. She does not like to take tylenol.  She noticed today that her lymph node on the left side of her neck is swollen.

## 2020-09-21 NOTE — Telephone Encounter (Signed)
-----   Message from Heath Lark, MD sent at 09/21/2020  8:23 AM EDT ----- Regarding: pls call and ask how her BP is doing over the weekend?

## 2020-09-21 NOTE — Telephone Encounter (Signed)
Called back and given below message. She verbalized understanding.  She is going to use [facility] in [place], [place] as pharmacy today. Appt scheduled for 11/1 with Dr. Alvy Bimler, she is aware of the time. She would like something a little stronger than tylenol. She does not like to take tylenol and will stop the advil. Hydralazine Rx sent to preferred pharmacy.

## 2020-09-21 NOTE — Telephone Encounter (Signed)
Called and left below message. Ask her to call the office back. 

## 2020-09-28 ENCOUNTER — Other Ambulatory Visit: Payer: Self-pay

## 2020-09-28 ENCOUNTER — Inpatient Hospital Stay: Payer: Medicare Other | Attending: Hematology and Oncology | Admitting: Hematology and Oncology

## 2020-09-28 ENCOUNTER — Encounter: Payer: Self-pay | Admitting: Hematology and Oncology

## 2020-09-28 DIAGNOSIS — C778 Secondary and unspecified malignant neoplasm of lymph nodes of multiple regions: Secondary | ICD-10-CM | POA: Diagnosis not present

## 2020-09-28 DIAGNOSIS — I10 Essential (primary) hypertension: Secondary | ICD-10-CM | POA: Diagnosis not present

## 2020-09-28 DIAGNOSIS — E1165 Type 2 diabetes mellitus with hyperglycemia: Secondary | ICD-10-CM | POA: Insufficient documentation

## 2020-09-28 DIAGNOSIS — Z794 Long term (current) use of insulin: Secondary | ICD-10-CM | POA: Insufficient documentation

## 2020-09-28 DIAGNOSIS — C55 Malignant neoplasm of uterus, part unspecified: Secondary | ICD-10-CM | POA: Insufficient documentation

## 2020-09-28 DIAGNOSIS — Z5112 Encounter for antineoplastic immunotherapy: Secondary | ICD-10-CM | POA: Insufficient documentation

## 2020-09-28 DIAGNOSIS — E213 Hyperparathyroidism, unspecified: Secondary | ICD-10-CM | POA: Insufficient documentation

## 2020-09-28 DIAGNOSIS — R590 Localized enlarged lymph nodes: Secondary | ICD-10-CM | POA: Insufficient documentation

## 2020-09-28 MED ORDER — HYDRALAZINE HCL 25 MG PO TABS: 25.0000 mg | ORAL_TABLET | Freq: Three times a day (TID) | ORAL | 3 refills | Status: AC

## 2020-09-28 NOTE — Assessment & Plan Note (Signed)
She has flare of uncontrolled hypertension with the start on lenvatinib With addition of hydralazine, her blood pressure is coming down nicely She will continue twice daily blood pressure monitoring and will call me if her blood pressure flareup again

## 2020-09-28 NOTE — Assessment & Plan Note (Signed)
She has palpable lymphadenopathy in the left supraclavicular region I suspect this is related to her malignancy Observe only for now I will reexamine her neck in her next visit

## 2020-09-28 NOTE — Progress Notes (Signed)
[facility] OFFICE PROGRESS NOTE  Patient Care Team: Wendling, Nicholas Paul, DO as PCP - General (Family Medicine)  ASSESSMENT & PLAN:  Uterine cancer (HCC) She has recent uncontrolled hypertension and pain Her pain has resolved Blood pressure is better controlled with addition of hydralazine I will see her next week for further follow-up and cycle 2 of treatment  Essential hypertension She has flare of uncontrolled hypertension with the start on lenvatinib With addition of hydralazine, her blood pressure is coming down nicely She will continue twice daily blood pressure monitoring and will call me if her blood pressure flareup again  Metastasis to lymph nodes (HCC) She has palpable lymphadenopathy in the left supraclavicular region I suspect this is related to her malignancy Observe only for now I will reexamine her neck in her next visit   No orders of the defined types were placed in this encounter.   All questions were answered. The patient knows to call the clinic with any problems, questions or concerns. The total time spent in the appointment was 20 minutes encounter with patients including review of chart and various tests results, discussions about plan of care and coordination of care plan   Ni Gorsuch, MD 09/28/2020 3:31 PM  INTERVAL HISTORY: Please see below for problem oriented charting. She returns with her sister for further follow-up She strained her back recently after mopping the floor with severe uncontrolled pain The exacerbated her blood pressure When she call last week, her blood pressure was in the 180s Since introduction of hydralazine, her blood pressure has come down slightly under 150 She noted palpable lymph node on the left side of her neck She is vigilant about dietary control and she cook most of her food She denies excessive salt intake She has discontinued ibuprofen when we noted that her blood pressure was very  high  SUMMARY OF ONCOLOGIC HISTORY: Oncology History Overview Note  Carcinosarcoma and serous, 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   01/18/2018 Imaging   CT scan of the chest, abdomen and pelvis elsewhere show lung nodules, kidney lesion as well as 1.5 x 1 cm left, iliac lymph node   01/23/2018 - 06/12/2018 Chemotherapy   The patient had adjuvant chemotherapy with carboplatin and taxol elsewhere for chemotherapy treatment.     06/25/2018 Imaging   CT scan of the chest, abdomen and pelvis showed persistent lung nodules, kidney lesion but reduced size of previously noted pelvic lymphadenopathy to 6 x 4 mm   07/09/2018 - 08/17/2018 Radiation Therapy   She had daily radiation treatment for 5 weeks elsewhere in [place], 28 days treatment, 5040 cGy   09/25/2018 Imaging   CT scan of the chest, abdomen and pelvis shows stable lung nodule and kidney lesion.  Previously noted enlarged lymphadenopathy in the external iliac region has resolved   01/16/2019 Imaging   CT imaging of the chest, abdomen and pelvis elsewhere showed stable kidney lesions but no evidence of cancer recurrence   05/16/2019 Imaging   Outside CT scan showed stable pulmonary nodules.  Bilateral simple renal cyst with an exophytic lesion in the medial cortex of the   lower pole of the right kidney measure 10 mm with enhancement postcontrast.  There is 6 mm lymph node in the left para-aortic region.  There is an enlarging hyper enhancing aortocaval lymph node measuring 5 mm.   08/27/2019 PET scan   There is left supraclavicular lymph node measure 7 mm, another  additional supraclavicular region 9 mm with SUV of 8.3.  There is abnormal uptake in retrocrural lymph nodes, SUV 3.7.  There is elevated FDG uptake in the patient's known retroperitoneal lymph nodes.  There is a small solid-appearing right inferior medial renal lesion with no FDG uptake.   09/13/2019 Procedure   She underwent biopsy of 2 left supraclavicular lymph node elsewhere   09/13/2019 Pathology Results   Immunohistochemical stains performed on the biopsy report showed that the tumor cells are strongly and diffusely positive for p16, PAX 8, p53, CK7, pancytokeratin and beta-catenin.  CA-125, CK20 and TTF-1 are negative.  Ki-67 stains approximately 50% of tumor cells.  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   10/09/2019 -  Chemotherapy   The patient had docetaxel and carboplatin for chemotherapy treatment elsewhere in [place].  Her treatment was initially given weekly.  Cycle 1 was complicated by neutropenia.  Cycle 2 was dose adjusted and delay.  Cycle 2 treatment was complicated by loss of appetite, severe diarrhea and persistent neutropenia.  Cycle 3 was delay as well.  She went to the emergency department twice for IV fluid hydration and have lost a lot of weight.    12/18/2019 Imaging   CT chest, abdomen and pelvis 1. Interval resolution of the left supraclavicular, retroperitoneal, and right retrocrural lymphadenopathy seen on previous diagnostic CT and PET-CT. Tiny lymph nodes are seen in the retroperitoneal region today. 2. Tiny bilateral pulmonary nodules, indeterminate. These do not appear substantially changed although we do not have a prior diagnostic CT chest to compare. Previous PET-CT is a free breathing exam which obscure small pulmonary nodules. Attention on follow-up recommended. 3. Bilateral interstitial and patchy peripheral ground-glass opacity in both lungs, nonspecific but potentially related to  infectious/inflammatory process or scarring. 4. 10 mm enhancing lesion lower pole right kidney, suspicious for neoplasm. MRI without and with contrast recommended to further evaluate. 5. Small hiatal hernia. 6. Enlargement of the pulmonary outflow tract and main pulmonary arteries compatible with pulmonary arterial hypertension.   03/09/2020 Imaging   1. No findings suspicious for metastatic disease in the chest, abdomen or pelvis. 2. Small pulmonary nodules are all stable and warrant continued chest CT follow-up. 3. Stable indeterminate exophytic 1.2 cm renal cortical lesion in the lateral lower left kidney, renal cell carcinoma not excluded. Dedicated MRI abdomen without and with IV contrast may be obtained for further evaluation as clinically warranted. 4. Chronic findings include: Stable dilated main pulmonary artery, suggesting pulmonary arterial hypertension. Small hiatal hernia. Mild left colonic diverticulosis. Two-vessel coronary atherosclerosis. Aortic Atherosclerosis (ICD10-I70.0).     06/04/2020 Imaging   1. Interval progression of small retroperitoneal lymph nodes. While not technically enlarged by CT criteria, the interval progression is concerning. The enlargement of these nodes is more apparent when comparing back to the older study from 12/18/2019 and they are now similar in size to the abnormal lymph nodes seen on the outside imaging of 12/03/2019. Close follow-up recommended. 2. Bilateral pulmonary nodules, similar to prior. The 6 mm right lower lobe nodule appears more confluent than on the prior study and is slightly increased from 5 mm when  I remeasure on the previous exam. Close attention on follow-up recommended. 3. Small hiatal hernia. 4. Left colonic diverticulosis without diverticulitis. 5. Aortic Atherosclerosis (ICD10-I70.0).   08/31/2020 Imaging   1. Today's study is again concerning for progressive metastatic disease both with increased number and size of numerous small  pulmonary nodules in the visualize lung bases, as well as increased number and size of numerous retroperitoneal lymph nodes, as detailed above. 2. Multiple cystic lesions in the kidneys bilaterally, favored to represent a combination of Bosniak class 1 and Bosniak class 2 cysts, as detailed above. 3. Colonic diverticulosis without evidence of acute diverticulitis at this time. 4. Cardiomegaly with left atrial dilatation. 5. Small hiatal hernia. 6. Aortic atherosclerosis.     09/15/2020 -  Chemotherapy   The patient had lenvatinib 20 mg daily dose (LENVIMA) 2 x 10 MG capsule, 10 mg (50 % of original dose 20 mg), Oral, Daily, 1 of 1 cycle, Start date: 09/02/2020, End date: 09/02/2020 Dose modification: 10 mg (original dose 20 mg, Cycle 0) pembrolizumab (KEYTRUDA) 200 mg in sodium chloride 0.9 % 50 mL chemo infusion, 200 mg, Intravenous, Once, 1 of 6 cycles Administration: 200 mg (09/15/2020)  for chemotherapy treatment.      REVIEW OF SYSTEMS:   Constitutional: Denies fevers, chills or abnormal weight loss Eyes: Denies blurriness of vision Ears, nose, mouth, throat, and face: Denies mucositis or sore throat Respiratory: Denies cough, dyspnea or wheezes Cardiovascular: Denies palpitation, chest discomfort or lower extremity swelling Gastrointestinal:  Denies nausea, heartburn or change in bowel habits Skin: Denies abnormal skin rashes Neurological:Denies numbness, tingling or new weaknesses Behavioral/Psych: Mood is stable, no new changes  All other systems were reviewed with the patient and are negative.  I have reviewed the past medical history, past surgical history, social history and family history with the patient and they are unchanged from previous note.  ALLERGIES:  is allergic to verapamil.  MEDICATIONS:  Current Outpatient Medications  Medication Sig Dispense Refill  . amLODipine (NORVASC) 10 MG tablet Take 1 tablet (10 mg total) by mouth daily. 30 tablet 1  . aspirin EC 81  MG tablet Take 81 mg by mouth daily.    . clonazePAM (KLONOPIN) 0.5 MG tablet Take 1 tablet (0.5 mg total) by mouth at bedtime. 30 tablet 4  . glipiZIDE (GLUCOTROL) 5 MG tablet Take 0.5 tablets by mouth 2 (two) times daily.    . hydrALAZINE (APRESOLINE) 25 MG tablet Take 1 tablet (25 mg total) by mouth 3 (three) times daily. 270 tablet 3  . hydrochlorothiazide (HYDRODIURIL) 25 MG tablet Take 1 tablet (25 mg total) by mouth daily. 30 tablet 1  . lenvatinib 10 mg daily dose (LENVIMA, 10 MG DAILY DOSE,) capsule Take 1 capsule (10 mg total) by mouth daily. 30 capsule 11  . lidocaine-prilocaine (EMLA) cream Apply 1 application topically as needed.    . lidocaine-prilocaine (EMLA) cream Apply to affected area once 30 g 3  . losartan (COZAAR) 100 MG tablet Take 100 mg by mouth daily.    . metoprolol succinate (TOPROL-XL) 100 MG 24 hr tablet Take 100 mg by mouth daily. Take with or immediately following a meal.    . ondansetron (ZOFRAN) 8 MG tablet Take 1 tablet (8 mg total) by mouth every 8 (eight) hours as needed (Nausea or vomiting). 30 tablet 1  . oxyCODONE (OXY IR/ROXICODONE) 5 MG immediate release tablet Take 1 tablet (5 mg total) by mouth every 4 (four) hours as needed for severe pain. 30 tablet  0  . prochlorperazine (COMPAZINE) 10 MG tablet Take 1 tablet (10 mg total) by mouth every 6 (six) hours as needed (Nausea or vomiting). 30 tablet 1  . rosuvastatin (CRESTOR) 10 MG tablet Take 1 tablet (10 mg total) by mouth daily. 90 tablet 2  . terazosin (HYTRIN) 2 MG capsule Take 1 capsule (2 mg total) by mouth at bedtime. 30 capsule 1   No current facility-administered medications for this visit.    PHYSICAL EXAMINATION: ECOG PERFORMANCE STATUS: 1 - Symptomatic but completely ambulatory  Vitals:   09/28/20 1139  BP: (!) 141/76  Pulse: (!) 58  Resp: 17  Temp: (!) 97.3 F (36.3 C)  SpO2: 97%   Filed Weights   09/28/20 1139  Weight: 158 lb 8 oz (71.9 kg)    GENERAL:alert, no distress and  comfortable SKIN: skin color, texture, turgor are normal, no rashes or significant lesions EYES: normal, Conjunctiva are pink and non-injected, sclera clear OROPHARYNX:no exudate, no erythema and lips, buccal mucosa, and tongue normal  NECK: supple, thyroid normal size, non-tender, without nodularity LYMPH: She has palpable lymphadenopathy on the left side of her neck , near the supraclavicular region  NEURO: alert & oriented x 3 with fluent speech, no focal motor/sensory deficits  LABORATORY DATA:  I have reviewed the data as listed    Component Value Date/Time   NA 138 09/15/2020 1128   K 4.0 09/15/2020 1128   CL 108 09/15/2020 1128   CO2 27 09/15/2020 1128   GLUCOSE 171 (H) 09/15/2020 1128   BUN 18 09/15/2020 1128   CREATININE 0.83 09/15/2020 1128   CALCIUM 10.5 (H) 09/15/2020 1128   PROT 6.6 09/15/2020 1128   ALBUMIN 3.9 09/15/2020 1128   AST 14 (L) 09/15/2020 1128   ALT 15 09/15/2020 1128   ALKPHOS 56 09/15/2020 1128   BILITOT 0.6 09/15/2020 1128   GFRNONAA >60 09/15/2020 1128   GFRAA >60 08/31/2020 1022    No results found for: SPEP, UPEP  Lab Results  Component Value Date   WBC 5.4 09/15/2020   NEUTROABS 4.1 09/15/2020   HGB 12.5 09/15/2020   HCT 36.8 09/15/2020   MCV 93.4 09/15/2020   PLT 217 09/15/2020      Chemistry      Component Value Date/Time   NA 138 09/15/2020 1128   K 4.0 09/15/2020 1128   CL 108 09/15/2020 1128   CO2 27 09/15/2020 1128   BUN 18 09/15/2020 1128   CREATININE 0.83 09/15/2020 1128      Component Value Date/Time   CALCIUM 10.5 (H) 09/15/2020 1128   ALKPHOS 56 09/15/2020 1128   AST 14 (L) 09/15/2020 1128   ALT 15 09/15/2020 1128   BILITOT 0.6 09/15/2020 1128       

## 2020-09-28 NOTE — Assessment & Plan Note (Signed)
She has recent uncontrolled hypertension and pain Her pain has resolved Blood pressure is better controlled with addition of hydralazine I will see her next week for further follow-up and cycle 2 of treatment

## 2020-10-02 ENCOUNTER — Ambulatory Visit (INDEPENDENT_AMBULATORY_CARE_PROVIDER_SITE_OTHER): Payer: Medicare Other | Admitting: Family Medicine

## 2020-10-02 ENCOUNTER — Encounter: Payer: Self-pay | Admitting: Family Medicine

## 2020-10-02 ENCOUNTER — Other Ambulatory Visit: Payer: Self-pay

## 2020-10-02 VITALS — BP 112/72 | HR 60 | Temp 98.5°F | Ht 62.0 in | Wt 159.0 lb

## 2020-10-02 DIAGNOSIS — I10 Essential (primary) hypertension: Secondary | ICD-10-CM

## 2020-10-02 DIAGNOSIS — E1165 Type 2 diabetes mellitus with hyperglycemia: Secondary | ICD-10-CM

## 2020-10-02 MED ORDER — DAPAGLIFLOZIN PROPANEDIOL 10 MG PO TABS: 10.0000 mg | ORAL_TABLET | Freq: Every day | ORAL | 2 refills | Status: DC

## 2020-10-02 NOTE — Patient Instructions (Signed)
Give [place] 2-3 business days to get the results of your labs back.   Keep the diet clean and stay active.  Send me a message regarding your experience with the new medication cost.   Schedule an eye appointment.  Let us know if you need anything.

## 2020-10-02 NOTE — Progress Notes (Signed)
Subjective:   Chief Complaint  Patient presents with  . Follow-up    6 month    Sandra Arroyo is a 69 y.o. female here for follow-up of diabetes.   Sandra Arroyo's self monitored glucose range is low-mid 100's's.  Patient denies hypoglycemic reactions. She checks her glucose levels 1 time per week. Patient does not require insulin.   Medications include: glipizide 2.5 mg bid Diet is healthy.  Exercise: walking  Hypertension Patient presents for hypertension follow up. She does monitor home blood pressures. Blood pressures ranging on average from 140's/70's. She is compliant with medications- losartan 100 mg/d, HCTZ 25 mg/d, Norvasc 10 mg/d, hydralazine 25 mg TID. Patient has these side effects of medication: none Diet/exercise as above  Past Medical History:  Diagnosis Date  . Cancer ([place]) 2019  . Diabetes mellitus without complication ([place])   . Hyperparathyroidism, unspecified ([place])   . Hypertension      Related testing: Retinal exam: Due Pneumovax: done  Objective:  BP 112/72 (BP Location: Left Arm, Patient Position: Sitting, Cuff Size: Normal)   Pulse 60   Temp 98.5 F (36.9 C) (Oral)   Ht 5\' 2"  (1.575 m)   Wt 159 lb (72.1 kg)   SpO2 97%   BMI 29.08 kg/m  General:  Well developed, well nourished, in no apparent distress Skin:  Warm, no pallor or diaphoresis Head:  Normocephalic, atraumatic Eyes:  Pupils equal and round, sclera anicteric without injection  Lungs:  CTAB, no access msc use Cardio:  RRR, no bruits, no LE edema Musculoskeletal:  Symmetrical muscle groups noted without atrophy or deformity Neuro:  Sensation intact to pinprick on feet Psych: Age appropriate judgment and insight  Assessment:   Type 2 diabetes mellitus with hyperglycemia, without long-term current use of insulin (HCC) - Plan: Lipid panel, Hemoglobin A1c  Essential hypertension   Plan:   1. See if ins will cover Wilder Glade so we can remove her from [facility]. She has not done well with either  form of metformin. Counseled on diet and exercise. Goal A1c is <8. 2. Cont current management. If her home readings cont to differ from office readings, will have her bring in monitor to compare. I did rec covid vaccine, but will defer to hem/onc as she is receiving immunotherapy.  F/u in 6 mo. The patient voiced understanding and agreement to the plan.  [place], DO 10/02/20 10:52 AM

## 2020-10-03 LAB — LIPID PANEL
Cholesterol: 152 mg/dL (ref <200)
HDL: 32 mg/dL — ABNORMAL LOW (ref >=50)
LDL Cholesterol (Calc): 81 mg/dL (calc)
Non-HDL Cholesterol (Calc): 120 mg/dL (calc) (ref <130)
Total CHOL/HDL Ratio: 4.8 (calc) (ref <5.0)
Triglycerides: 322 mg/dL — ABNORMAL HIGH (ref <150)

## 2020-10-03 LAB — HEMOGLOBIN A1C
Hgb A1c MFr Bld: 6.8 % of total Hgb — ABNORMAL HIGH (ref <5.7)
Mean Plasma Glucose: 148 (calc)
eAG (mmol/L): 8.2 (calc)

## 2020-10-05 ENCOUNTER — Other Ambulatory Visit: Payer: Self-pay | Admitting: Family Medicine

## 2020-10-05 DIAGNOSIS — E785 Hyperlipidemia, unspecified: Secondary | ICD-10-CM

## 2020-10-06 ENCOUNTER — Other Ambulatory Visit: Payer: Self-pay | Admitting: Hematology and Oncology

## 2020-10-06 ENCOUNTER — Inpatient Hospital Stay: Payer: Medicare Other

## 2020-10-06 ENCOUNTER — Encounter: Payer: Self-pay | Admitting: Hematology and Oncology

## 2020-10-06 ENCOUNTER — Inpatient Hospital Stay (HOSPITAL_BASED_OUTPATIENT_CLINIC_OR_DEPARTMENT_OTHER): Payer: Medicare Other | Admitting: Hematology and Oncology

## 2020-10-06 ENCOUNTER — Other Ambulatory Visit: Payer: Self-pay

## 2020-10-06 DIAGNOSIS — C778 Secondary and unspecified malignant neoplasm of lymph nodes of multiple regions: Secondary | ICD-10-CM | POA: Diagnosis not present

## 2020-10-06 DIAGNOSIS — C55 Malignant neoplasm of uterus, part unspecified: Secondary | ICD-10-CM

## 2020-10-06 DIAGNOSIS — Z5112 Encounter for antineoplastic immunotherapy: Secondary | ICD-10-CM | POA: Diagnosis not present

## 2020-10-06 DIAGNOSIS — I10 Essential (primary) hypertension: Secondary | ICD-10-CM

## 2020-10-06 DIAGNOSIS — Z95828 Presence of other vascular implants and grafts: Secondary | ICD-10-CM

## 2020-10-06 DIAGNOSIS — R590 Localized enlarged lymph nodes: Secondary | ICD-10-CM | POA: Diagnosis not present

## 2020-10-06 DIAGNOSIS — E213 Hyperparathyroidism, unspecified: Secondary | ICD-10-CM | POA: Diagnosis not present

## 2020-10-06 DIAGNOSIS — Z794 Long term (current) use of insulin: Secondary | ICD-10-CM | POA: Diagnosis not present

## 2020-10-06 DIAGNOSIS — E119 Type 2 diabetes mellitus without complications: Secondary | ICD-10-CM

## 2020-10-06 DIAGNOSIS — E1165 Type 2 diabetes mellitus with hyperglycemia: Secondary | ICD-10-CM | POA: Diagnosis not present

## 2020-10-06 LAB — CBC WITH DIFFERENTIAL (CANCER CENTER ONLY)
Abs Immature Granulocytes: 0.03 10*3/uL (ref 0.00–0.07)
Basophils Absolute: 0.1 10*3/uL (ref 0.0–0.1)
Basophils Relative: 1 %
Eosinophils Absolute: 0.1 10*3/uL (ref 0.0–0.5)
Eosinophils Relative: 1 %
HCT: 37.7 % (ref 36.0–46.0)
Hemoglobin: 13.3 g/dL (ref 12.0–15.0)
Immature Granulocytes: 1 %
Lymphocytes Relative: 21 %
Lymphs Abs: 1.1 10*3/uL (ref 0.7–4.0)
MCH: 30.9 pg (ref 26.0–34.0)
MCHC: 35.3 g/dL (ref 30.0–36.0)
MCV: 87.7 fL (ref 80.0–100.0)
Monocytes Absolute: 0.4 10*3/uL (ref 0.1–1.0)
Monocytes Relative: 9 %
Neutro Abs: 3.3 10*3/uL (ref 1.7–7.7)
Neutrophils Relative %: 67 %
Platelet Count: 219 10*3/uL (ref 150–400)
RBC: 4.3 MIL/uL (ref 3.87–5.11)
RDW: 12 % (ref 11.5–15.5)
WBC Count: 5 10*3/uL (ref 4.0–10.5)
nRBC: 0 % (ref 0.0–0.2)

## 2020-10-06 LAB — CMP (CANCER CENTER ONLY)
ALT: 14 U/L (ref 0–44)
AST: 20 U/L (ref 15–41)
Albumin: 3.9 g/dL (ref 3.5–5.0)
Alkaline Phosphatase: 72 U/L (ref 38–126)
Anion gap: 10 (ref 5–15)
BUN: 24 mg/dL — ABNORMAL HIGH (ref 8–23)
CO2: 26 mmol/L (ref 22–32)
Calcium: 10.1 mg/dL (ref 8.9–10.3)
Chloride: 100 mmol/L (ref 98–111)
Creatinine: 1.07 mg/dL — ABNORMAL HIGH (ref 0.44–1.00)
GFR, Estimated: 56 mL/min — ABNORMAL LOW (ref >60)
Glucose, Bld: 253 mg/dL — ABNORMAL HIGH (ref 70–99)
Potassium: 3.7 mmol/L (ref 3.5–5.1)
Sodium: 136 mmol/L (ref 135–145)
Total Bilirubin: 0.9 mg/dL (ref 0.3–1.2)
Total Protein: 7 g/dL (ref 6.5–8.1)

## 2020-10-06 LAB — TSH: TSH: 2.787 u[IU]/mL (ref 0.308–3.960)

## 2020-10-06 LAB — TOTAL PROTEIN, URINE DIPSTICK: Protein, ur: 30 mg/dL — AB

## 2020-10-06 MED ORDER — SODIUM CHLORIDE 0.9 % IV SOLN
Freq: Once | INTRAVENOUS | Status: AC
  Filled 2020-10-06: qty 250

## 2020-10-06 MED ORDER — SODIUM CHLORIDE 0.9 % IV SOLN
200.0000 mg | Freq: Once | INTRAVENOUS | Status: AC
  Administered 2020-10-06: 200 mg via INTRAVENOUS
  Filled 2020-10-06: qty 8

## 2020-10-06 MED ORDER — HEPARIN SOD (PORK) LOCK FLUSH 100 UNIT/ML IV SOLN
500.0000 [IU] | Freq: Once | INTRAVENOUS | Status: AC | PRN
  Administered 2020-10-06: 500 [IU]
  Filled 2020-10-06: qty 5

## 2020-10-06 MED ORDER — INSULIN ASPART 100 UNIT/ML ~~LOC~~ SOLN
10.0000 [IU] | Freq: Once | SUBCUTANEOUS | Status: AC
  Administered 2020-10-06: 10 [IU] via SUBCUTANEOUS

## 2020-10-06 MED ORDER — SODIUM CHLORIDE 0.9% FLUSH
10.0000 mL | INTRAVENOUS | Status: DC | PRN
  Administered 2020-10-06: 10 mL
  Filled 2020-10-06: qty 10

## 2020-10-06 MED ORDER — INSULIN ASPART 100 UNIT/ML ~~LOC~~ SOLN
SUBCUTANEOUS | Status: AC
  Filled 2020-10-06: qty 1

## 2020-10-06 NOTE — Progress Notes (Signed)
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[facility]) She has palpable lymphadenopathy in the left supraclavicular region I suspect this is related to her malignancy Observe only for now   Essential hypertension She has flare of uncontrolled hypertension with the start on lenvatinib With addition of hydralazine, her blood pressure is coming down nicely She will continue twice daily blood pressure monitoring and will call me if her blood pressure flareup again  Diabetes mellitus without complication, without long-term current use of insulin ([place]) Her blood sugar is very high today and she will receive some insulin during treatment  we advised dietary modification while on treatment   No orders of the defined types were placed in this encounter.   All questions were answered. The patient knows to call the clinic with any problems, questions or concerns. The total time spent in the appointment was 20 minutes encounter with patients including review of chart and various tests results, discussions about plan of care and coordination of care plan   Heath Lark, MD 10/06/2020 12:48 PM  INTERVAL HISTORY: Please see below for problem oriented charting. 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   01/18/2018 Imaging   CT scan of the chest, abdomen and pelvis elsewhere show lung nodules, kidney lesion as well as 1.5 x 1 cm left, iliac lymph node   01/23/2018 - 06/12/2018 Chemotherapy   The patient had adjuvant chemotherapy with carboplatin and taxol elsewhere for chemotherapy treatment.     06/25/2018 Imaging   CT scan of the chest, abdomen and pelvis showed persistent lung nodules, kidney lesion but reduced size of previously noted pelvic lymphadenopathy to 6 x 4 mm   07/09/2018 - 08/17/2018 Radiation Therapy   She had daily radiation treatment for 5 weeks elsewhere in [place], 28 days treatment, 5040 cGy   09/25/2018 Imaging   CT scan of the chest, abdomen and pelvis shows stable lung nodule and kidney lesion.  Previously noted enlarged lymphadenopathy in the external iliac region has resolved   01/16/2019 Imaging   CT imaging of the chest, abdomen and pelvis elsewhere showed stable kidney lesions but no evidence of cancer recurrence   05/16/2019 Imaging   Outside CT scan showed stable pulmonary nodules.  Bilateral simple renal cyst with an exophytic lesion in the medial cortex of the lower pole of the right kidney measure 10 mm with enhancement postcontrast.  There is 6 mm lymph node in the left para-aortic region.  There is an  enlarging hyper enhancing aortocaval lymph node measuring 5 mm.   08/27/2019 PET scan   There is left supraclavicular lymph node measure 7 mm, another additional supraclavicular region 9 mm with SUV of 8.3.  There is abnormal uptake in retrocrural lymph nodes, SUV 3.7.  There is  elevated FDG uptake in the patient's known retroperitoneal lymph nodes.  There is a small solid-appearing right inferior medial renal lesion with no FDG uptake.   09/13/2019 Procedure   She underwent biopsy of 2 left supraclavicular lymph node elsewhere   09/13/2019 Pathology Results   Immunohistochemical stains performed on the biopsy report showed that the tumor cells are strongly and diffusely positive for p16, PAX 8, p53, CK7, pancytokeratin and beta-catenin.  CA-125, CK20 and TTF-1 are negative.  Ki-67 stains approximately 50% of tumor cells.  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   10/09/2019 -  Chemotherapy   The patient had docetaxel and carboplatin for chemotherapy treatment elsewhere in [place].  Her treatment was initially given weekly.  Cycle 1 was complicated by neutropenia.  Cycle 2 was dose adjusted and delay.  Cycle 2 treatment was complicated by loss of appetite, severe diarrhea and persistent neutropenia.  Cycle 3 was delay as well.  She went to the emergency department twice for IV fluid hydration and have lost a lot of weight.    12/18/2019 Imaging   CT chest, abdomen and pelvis 1. Interval resolution of the left supraclavicular, retroperitoneal, and right retrocrural lymphadenopathy seen on previous diagnostic CT and PET-CT. Tiny lymph nodes are seen in the retroperitoneal region today. 2. Tiny bilateral pulmonary nodules, indeterminate. These do not appear substantially changed although we do not have a prior diagnostic CT chest to compare. Previous PET-CT is a free breathing exam which obscure small pulmonary nodules. Attention on follow-up recommended. 3. Bilateral interstitial and patchy peripheral ground-glass opacity in both lungs, nonspecific but potentially related to infectious/inflammatory process or scarring. 4. 10 mm enhancing lesion lower pole right kidney, suspicious for neoplasm. MRI  without and with contrast recommended to further evaluate. 5. Small hiatal hernia. 6. Enlargement of the pulmonary outflow tract and main pulmonary arteries compatible with pulmonary arterial hypertension.   03/09/2020 Imaging   1. No findings suspicious for metastatic disease in the chest, abdomen or pelvis. 2. Small pulmonary nodules are all stable and warrant continued chest CT follow-up. 3. Stable indeterminate exophytic 1.2 cm renal cortical lesion in the lateral lower left kidney, renal cell carcinoma not excluded. Dedicated MRI abdomen without and with IV contrast may be obtained for further evaluation as clinically warranted. 4. Chronic findings include: Stable dilated main pulmonary artery, suggesting pulmonary arterial hypertension. Small hiatal hernia. Mild left colonic diverticulosis. Two-vessel coronary atherosclerosis. Aortic Atherosclerosis (ICD10-I70.0).     06/04/2020 Imaging   1. Interval progression of small retroperitoneal lymph nodes. While not technically enlarged by CT criteria, the interval progression is concerning. The enlargement of these nodes is more apparent when comparing back to the older study from 12/18/2019 and they are now similar in size to the abnormal lymph nodes seen on the outside imaging of 12/03/2019. Close follow-up recommended. 2. Bilateral pulmonary nodules, similar to prior. The 6 mm right lower lobe nodule appears more confluent than on the prior study and is slightly increased from 5 mm when I remeasure on the previous exam. Close attention on follow-up recommended. 3. Small hiatal hernia. 4. Left colonic diverticulosis without diverticulitis. 5. Aortic Atherosclerosis (ICD10-I70.0).   08/31/2020  Imaging   1. Today's study is again concerning for progressive metastatic disease both with increased number and size of numerous small pulmonary nodules in the visualize lung bases, as well as increased number and size of numerous retroperitoneal lymph nodes,  as detailed above. 2. Multiple cystic lesions in the kidneys bilaterally, favored to represent a combination of Bosniak class 1 and Bosniak class 2 cysts, as detailed above. 3. Colonic diverticulosis without evidence of acute diverticulitis at this time. 4. Cardiomegaly with left atrial dilatation. 5. Small hiatal hernia. 6. Aortic atherosclerosis.     09/15/2020 -  Chemotherapy   The patient had lenvatinib 20 mg daily dose (LENVIMA) 2 x 10 MG capsule, 10 mg (50 % of original dose 20 mg), Oral, Daily, 1 of 1 cycle, Start date: 09/02/2020, End date: 09/02/2020 Dose modification: 10 mg (original dose 20 mg, Cycle 0) pembrolizumab (KEYTRUDA) 200 mg in sodium chloride 0.9 % 50 mL chemo infusion, 200 mg, Intravenous, Once, 2 of 6 cycles Administration: 200 mg (09/15/2020)  for chemotherapy treatment.      REVIEW OF SYSTEMS:   Constitutional: Denies fevers, chills or abnormal weight loss Eyes: Denies blurriness of vision Ears, nose, mouth, throat, and face: Denies mucositis or sore throat Respiratory: Denies cough, dyspnea or wheezes Cardiovascular: Denies palpitation, chest discomfort or lower extremity swelling Gastrointestinal:  Denies nausea, heartburn or change in bowel habits Skin: Denies abnormal skin rashes Neurological:Denies numbness, tingling or new weaknesses Behavioral/Psych: Mood is stable, no new changes  All other systems were reviewed with the patient and are negative.  I have reviewed the past medical history, past surgical history, social history and family history with the patient and they are unchanged from previous note.  ALLERGIES:  is allergic to verapamil.  MEDICATIONS:  Current Outpatient Medications  Medication Sig Dispense Refill  . amLODipine (NORVASC) 10 MG tablet Take 1 tablet (10 mg total) by mouth daily. 30 tablet 1  . aspirin EC 81 MG tablet Take 81 mg by mouth daily.    . clonazePAM (KLONOPIN) 0.5 MG tablet Take 1 tablet (0.5 mg total) by mouth at  bedtime. 30 tablet 4  . dapagliflozin propanediol (FARXIGA) 10 MG TABS tablet Take 1 tablet (10 mg total) by mouth daily before breakfast. 90 tablet 2  . glipiZIDE (GLUCOTROL) 5 MG tablet Take 0.5 tablets by mouth 2 (two) times daily.    . hydrALAZINE (APRESOLINE) 25 MG tablet Take 1 tablet (25 mg total) by mouth 3 (three) times daily. 270 tablet 3  . hydrochlorothiazide (HYDRODIURIL) 25 MG tablet Take 1 tablet (25 mg total) by mouth daily. 30 tablet 1  . lenvatinib 10 mg daily dose (LENVIMA, 10 MG DAILY DOSE,) capsule Take 1 capsule (10 mg total) by mouth daily. 30 capsule 11  . lidocaine-prilocaine (EMLA) cream Apply 1 application topically as needed.    . lidocaine-prilocaine (EMLA) cream Apply to affected area once 30 g 3  . losartan (COZAAR) 100 MG tablet Take 100 mg by mouth daily.    . metoprolol succinate (TOPROL-XL) 100 MG 24 hr tablet Take 100 mg by mouth daily. Take with or immediately following a meal.    . ondansetron (ZOFRAN) 8 MG tablet Take 1 tablet (8 mg total) by mouth every 8 (eight) hours as needed (Nausea or vomiting). 30 tablet 1  . oxyCODONE (OXY IR/ROXICODONE) 5 MG immediate release tablet Take 1 tablet (5 mg total) by mouth every 4 (four) hours as needed for severe pain. 30 tablet 0  . prochlorperazine (COMPAZINE)  10 MG tablet Take 1 tablet (10 mg total) by mouth every 6 (six) hours as needed (Nausea or vomiting). 30 tablet 1  . rosuvastatin (CRESTOR) 10 MG tablet Take 1 tablet (10 mg total) by mouth daily. 90 tablet 2  . terazosin (HYTRIN) 2 MG capsule Take 1 capsule (2 mg total) by mouth at bedtime. 30 capsule 1   No current facility-administered medications for this visit.   Facility-Administered Medications Ordered in Other Visits  Medication Dose Route Frequency Provider Last Rate Last Admin  . heparin lock flush 100 unit/mL  500 Units Intracatheter Once PRN Alvy Bimler, Sly Parlee, MD      . insulin aspart (novoLOG) injection 10 Units  10 Units Subcutaneous Once Alvy Bimler, Jeanette Rauth,  MD      . pembrolizumab (KEYTRUDA) 200 mg in sodium chloride 0.9 % 50 mL chemo infusion  200 mg Intravenous Once Mishael Haran, MD      . sodium chloride flush (NS) 0.9 % injection 10 mL  10 mL Intracatheter PRN Alvy Bimler, Ziomara Birenbaum, MD        PHYSICAL EXAMINATION: ECOG PERFORMANCE STATUS: 1 - Symptomatic but completely ambulatory  Vitals:   10/06/20 1104  BP: 134/74  Pulse: 60  Resp: 18  Temp: (!) 96.8 F (36 C)  SpO2: 97%   Filed Weights   10/06/20 1104  Weight: 159 lb 6.4 oz (72.3 kg)    GENERAL:alert, no distress and comfortable SKIN: skin color, texture, turgor are normal, no rashes or significant lesions EYES: normal, Conjunctiva are pink and non-injected, sclera clear OROPHARYNX:no exudate, no erythema and lips, buccal mucosa, and tongue normal  NECK: supple, thyroid normal size, non-tender, without nodularity LYMPH: she has persistent palpable lymphadenopathy at the left supraclavicular region LUNGS: clear to auscultation and percussion with normal breathing effort HEART: regular rate & rhythm and no murmurs and no lower extremity edema ABDOMEN:abdomen soft, non-tender and normal bowel sounds Musculoskeletal:no cyanosis of digits and no clubbing  NEURO: alert & oriented x 3 with fluent speech, no focal motor/sensory deficits  LABORATORY DATA:  I have reviewed the data as listed    Component Value Date/Time   NA 136 10/06/2020 1051   K 3.7 10/06/2020 1051   CL 100 10/06/2020 1051   CO2 26 10/06/2020 1051   GLUCOSE 253 (H) 10/06/2020 1051   BUN 24 (H) 10/06/2020 1051   CREATININE 1.07 (H) 10/06/2020 1051   CALCIUM 10.1 10/06/2020 1051   PROT 7.0 10/06/2020 1051   ALBUMIN 3.9 10/06/2020 1051   AST 20 10/06/2020 1051   ALT 14 10/06/2020 1051   ALKPHOS 72 10/06/2020 1051   BILITOT 0.9 10/06/2020 1051   GFRNONAA 56 (L) 10/06/2020 1051   GFRAA >60 08/31/2020 1022    No results found for: SPEP, UPEP  Lab Results  Component Value Date   WBC 5.0 10/06/2020   NEUTROABS  3.3 10/06/2020   HGB 13.3 10/06/2020   HCT 37.7 10/06/2020   MCV 87.7 10/06/2020   PLT 219 10/06/2020      Chemistry      Component Value Date/Time   NA 136 10/06/2020 1051   K 3.7 10/06/2020 1051   CL 100 10/06/2020 1051   CO2 26 10/06/2020 1051   BUN 24 (H) 10/06/2020 1051   CREATININE 1.07 (H) 10/06/2020 1051      Component Value Date/Time   CALCIUM 10.1 10/06/2020 1051   ALKPHOS 72 10/06/2020 1051   AST 20 10/06/2020 1051   ALT 14 10/06/2020 1051   BILITOT 0.9 10/06/2020 1051

## 2020-10-06 NOTE — Assessment & Plan Note (Signed)
Her blood sugar is very high today and she will receive some insulin during treatment  we advised dietary modification while on treatment

## 2020-10-06 NOTE — Assessment & Plan Note (Signed)
Her blood pressure is better controlled Overall, she tolerated treatment better now Recommend minimum 3-4 cycles of treatment before repeat imaging study

## 2020-10-06 NOTE — Assessment & Plan Note (Signed)
She has palpable lymphadenopathy in the left supraclavicular region I suspect this is related to her malignancy Observe only for now

## 2020-10-06 NOTE — Patient Instructions (Signed)
[facility] Discharge Instructions for Patients Receiving Chemotherapy  Today you received the following chemotherapy agents Keytruda  To help prevent nausea and vomiting after your treatment, we encourage you to take your nausea medication as directed   If you develop nausea and vomiting that is not controlled by your nausea medication, call the clinic.   BELOW ARE SYMPTOMS THAT SHOULD BE REPORTED IMMEDIATELY:  *FEVER GREATER THAN 100.5 F  *CHILLS WITH OR WITHOUT FEVER  NAUSEA AND VOMITING THAT IS NOT CONTROLLED WITH YOUR NAUSEA MEDICATION  *UNUSUAL SHORTNESS OF BREATH  *UNUSUAL BRUISING OR BLEEDING  TENDERNESS IN MOUTH AND THROAT WITH OR WITHOUT PRESENCE OF ULCERS  *URINARY PROBLEMS  *BOWEL PROBLEMS  UNUSUAL RASH Items with * indicate a potential emergency and should be followed up as soon as possible.  Feel free to call the clinic should you have any questions or concerns. The clinic phone number is [phone].  Please show the CHEMO ALERT CARD at check-in to the Emergency Department and triage nurse.  Pembrolizumab injection What is this medicine? PEMBROLIZUMAB (pem broe liz ue mab) is a monoclonal antibody. It is used to treat certain types of cancer. This medicine may be used for other purposes; ask your health care provider or pharmacist if you have questions. COMMON BRAND NAME(S): Keytruda What should I tell my health care provider before I take this medicine? They need to know if you have any of these conditions:  diabetes  immune system problems  inflammatory bowel disease  liver disease  lung or breathing disease  lupus  received or scheduled to receive an organ transplant or a stem-cell transplant that uses donor stem cells  an unusual or allergic reaction to pembrolizumab, other medicines, foods, dyes, or preservatives  pregnant or trying to get pregnant  breast-feeding How should I use this medicine? This medicine is for  infusion into a vein. It is given by a health care professional in a hospital or clinic setting. A special MedGuide will be given to you before each treatment. Be sure to read this information carefully each time. Talk to your pediatrician regarding the use of this medicine in children. While this drug may be prescribed for children as young as 6 months for selected conditions, precautions do apply. Overdosage: If you think you have taken too much of this medicine contact a poison control center or emergency room at once. NOTE: This medicine is only for you. Do not share this medicine with others. What if I miss a dose? It is important not to miss your dose. Call your doctor or health care professional if you are unable to keep an appointment. What may interact with this medicine? Interactions have not been studied. Give your health care provider a list of all the medicines, herbs, non-prescription drugs, or dietary supplements you use. Also tell them if you smoke, drink alcohol, or use illegal drugs. Some items may interact with your medicine. This list may not describe all possible interactions. Give your health care provider a list of all the medicines, herbs, non-prescription drugs, or dietary supplements you use. Also tell them if you smoke, drink alcohol, or use illegal drugs. Some items may interact with your medicine. What should I watch for while using this medicine? Your condition will be monitored carefully while you are receiving this medicine. You may need blood work done while you are taking this medicine. Do not become pregnant while taking this medicine or for 4 months after stopping it. Women should   inform their doctor if they wish to become pregnant or think they might be pregnant. There is a potential for serious side effects to an unborn child. Talk to your health care professional or pharmacist for more information. Do not breast-feed an infant while taking this medicine or for 4  months after the last dose. What side effects may I notice from receiving this medicine? Side effects that you should report to your doctor or health care professional as soon as possible:  allergic reactions like skin rash, itching or hives, swelling of the face, lips, or tongue  bloody or black, tarry  breathing problems  changes in vision  chest pain  chills  confusion  constipation  cough  diarrhea  dizziness or feeling faint or lightheaded  fast or irregular heartbeat  fever  flushing  joint pain  low blood counts - this medicine may decrease the number of white blood cells, red blood cells and platelets. You may be at increased risk for infections and bleeding.  muscle pain  muscle weakness  pain, tingling, numbness in the hands or feet  persistent headache  redness, blistering, peeling or loosening of the skin, including inside the mouth  signs and symptoms of high blood sugar such as dizziness; dry mouth; dry skin; fruity breath; nausea; stomach pain; increased hunger or thirst; increased urination  signs and symptoms of kidney injury like trouble passing urine or change in the amount of urine  signs and symptoms of liver injury like dark urine, light-colored stools, loss of appetite, nausea, right upper belly pain, yellowing of the eyes or skin  sweating  swollen lymph nodes  weight loss Side effects that usually do not require medical attention (report to your doctor or health care professional if they continue or are bothersome):  decreased appetite  hair loss  muscle pain  tiredness This list may not describe all possible side effects. Call your doctor for medical advice about side effects. You may report side effects to FDA at 1-800-FDA-1088. Where should I keep my medicine? This drug is given in a hospital or clinic and will not be stored at home. NOTE: This sheet is a summary. It may not cover all possible information. If you have  questions about this medicine, talk to your doctor, pharmacist, or health care provider.  2020 Elsevier/Gold Standard (2019-09-20 18:07:58)  

## 2020-10-06 NOTE — Assessment & Plan Note (Signed)
She has flare of uncontrolled hypertension with the start on lenvatinib With addition of hydralazine, her blood pressure is coming down nicely She will continue twice daily blood pressure monitoring and will call me if her blood pressure flareup again

## 2020-10-07 LAB — T4: T4, Total: 6.2 ug/dL (ref 4.5–12.0)

## 2020-10-26 ENCOUNTER — Other Ambulatory Visit (INDEPENDENT_AMBULATORY_CARE_PROVIDER_SITE_OTHER): Payer: Medicare Other

## 2020-10-26 ENCOUNTER — Other Ambulatory Visit: Payer: Self-pay

## 2020-10-26 ENCOUNTER — Telehealth: Payer: Self-pay

## 2020-10-26 DIAGNOSIS — E785 Hyperlipidemia, unspecified: Secondary | ICD-10-CM

## 2020-10-27 ENCOUNTER — Inpatient Hospital Stay (HOSPITAL_BASED_OUTPATIENT_CLINIC_OR_DEPARTMENT_OTHER): Payer: Medicare Other | Admitting: Hematology and Oncology

## 2020-10-27 ENCOUNTER — Other Ambulatory Visit: Payer: Self-pay | Admitting: Hematology and Oncology

## 2020-10-27 ENCOUNTER — Inpatient Hospital Stay: Payer: Medicare Other

## 2020-10-27 ENCOUNTER — Other Ambulatory Visit: Payer: Self-pay

## 2020-10-27 ENCOUNTER — Other Ambulatory Visit: Payer: Medicare Other

## 2020-10-27 ENCOUNTER — Telehealth: Payer: Self-pay | Admitting: Hematology and Oncology

## 2020-10-27 ENCOUNTER — Encounter: Payer: Self-pay | Admitting: Hematology and Oncology

## 2020-10-27 VITALS — BP 130/69 | HR 58 | Temp 97.5°F | Resp 18 | Ht 62.0 in | Wt 157.0 lb

## 2020-10-27 DIAGNOSIS — C55 Malignant neoplasm of uterus, part unspecified: Secondary | ICD-10-CM

## 2020-10-27 DIAGNOSIS — I10 Essential (primary) hypertension: Secondary | ICD-10-CM

## 2020-10-27 DIAGNOSIS — Z5112 Encounter for antineoplastic immunotherapy: Secondary | ICD-10-CM | POA: Diagnosis not present

## 2020-10-27 DIAGNOSIS — C7800 Secondary malignant neoplasm of unspecified lung: Secondary | ICD-10-CM | POA: Insufficient documentation

## 2020-10-27 DIAGNOSIS — C778 Secondary and unspecified malignant neoplasm of lymph nodes of multiple regions: Secondary | ICD-10-CM

## 2020-10-27 DIAGNOSIS — E213 Hyperparathyroidism, unspecified: Secondary | ICD-10-CM

## 2020-10-27 DIAGNOSIS — C7802 Secondary malignant neoplasm of left lung: Secondary | ICD-10-CM

## 2020-10-27 DIAGNOSIS — C7801 Secondary malignant neoplasm of right lung: Secondary | ICD-10-CM | POA: Diagnosis not present

## 2020-10-27 DIAGNOSIS — E119 Type 2 diabetes mellitus without complications: Secondary | ICD-10-CM

## 2020-10-27 DIAGNOSIS — N289 Disorder of kidney and ureter, unspecified: Secondary | ICD-10-CM | POA: Diagnosis not present

## 2020-10-27 DIAGNOSIS — Z95828 Presence of other vascular implants and grafts: Secondary | ICD-10-CM

## 2020-10-27 LAB — LIPID PANEL
Cholesterol: 149 mg/dL (ref 0–200)
HDL: 30.8 mg/dL — ABNORMAL LOW (ref >39.00)
Total CHOL/HDL Ratio: 5
Triglycerides: 430 mg/dL — ABNORMAL HIGH (ref 0.0–149.0)

## 2020-10-27 LAB — CMP (CANCER CENTER ONLY)
ALT: 17 U/L (ref 0–44)
AST: 17 U/L (ref 15–41)
Albumin: 3.7 g/dL (ref 3.5–5.0)
Alkaline Phosphatase: 68 U/L (ref 38–126)
Anion gap: 11 (ref 5–15)
BUN: 22 mg/dL (ref 8–23)
CO2: 24 mmol/L (ref 22–32)
Calcium: 10.3 mg/dL (ref 8.9–10.3)
Chloride: 102 mmol/L (ref 98–111)
Creatinine: 1.01 mg/dL — ABNORMAL HIGH (ref 0.44–1.00)
GFR, Estimated: 60 mL/min — ABNORMAL LOW (ref >60)
Glucose, Bld: 327 mg/dL — ABNORMAL HIGH (ref 70–99)
Potassium: 3.4 mmol/L — ABNORMAL LOW (ref 3.5–5.1)
Sodium: 137 mmol/L (ref 135–145)
Total Bilirubin: 0.8 mg/dL (ref 0.3–1.2)
Total Protein: 6.5 g/dL (ref 6.5–8.1)

## 2020-10-27 LAB — CBC WITH DIFFERENTIAL (CANCER CENTER ONLY)
Abs Immature Granulocytes: 0.01 10*3/uL (ref 0.00–0.07)
Basophils Absolute: 0 10*3/uL (ref 0.0–0.1)
Basophils Relative: 1 %
Eosinophils Absolute: 0 10*3/uL (ref 0.0–0.5)
Eosinophils Relative: 1 %
HCT: 36.4 % (ref 36.0–46.0)
Hemoglobin: 12.5 g/dL (ref 12.0–15.0)
Immature Granulocytes: 0 %
Lymphocytes Relative: 23 %
Lymphs Abs: 0.9 10*3/uL (ref 0.7–4.0)
MCH: 30.9 pg (ref 26.0–34.0)
MCHC: 34.3 g/dL (ref 30.0–36.0)
MCV: 89.9 fL (ref 80.0–100.0)
Monocytes Absolute: 0.4 10*3/uL (ref 0.1–1.0)
Monocytes Relative: 10 %
Neutro Abs: 2.6 10*3/uL (ref 1.7–7.7)
Neutrophils Relative %: 65 %
Platelet Count: 172 10*3/uL (ref 150–400)
RBC: 4.05 MIL/uL (ref 3.87–5.11)
RDW: 12.7 % (ref 11.5–15.5)
WBC Count: 4 10*3/uL (ref 4.0–10.5)
nRBC: 0 % (ref 0.0–0.2)

## 2020-10-27 LAB — LDL CHOLESTEROL, DIRECT: Direct LDL: 68 mg/dL

## 2020-10-27 LAB — TOTAL PROTEIN, URINE DIPSTICK: Protein, ur: NEGATIVE mg/dL

## 2020-10-27 LAB — TSH: TSH: 0.366 u[IU]/mL (ref 0.308–3.960)

## 2020-10-27 MED ORDER — HYDROCHLOROTHIAZIDE 25 MG PO TABS
25.0000 mg | ORAL_TABLET | Freq: Every day | ORAL | 3 refills | Status: DC
Start: 2020-10-27 — End: 2020-12-25

## 2020-10-27 MED ORDER — SODIUM CHLORIDE 0.9 % IV SOLN
Freq: Once | INTRAVENOUS | Status: AC
  Filled 2020-10-27: qty 250

## 2020-10-27 MED ORDER — INSULIN ASPART 100 UNIT/ML ~~LOC~~ SOLN
SUBCUTANEOUS | Status: AC
  Filled 2020-10-27: qty 1

## 2020-10-27 MED ORDER — INSULIN ASPART 100 UNIT/ML ~~LOC~~ SOLN
10.0000 [IU] | Freq: Once | SUBCUTANEOUS | Status: AC
  Administered 2020-10-27: 10 [IU] via SUBCUTANEOUS

## 2020-10-27 MED ORDER — HEPARIN SOD (PORK) LOCK FLUSH 100 UNIT/ML IV SOLN
500.0000 [IU] | Freq: Once | INTRAVENOUS | Status: AC | PRN
  Administered 2020-10-27: 500 [IU]
  Filled 2020-10-27: qty 5

## 2020-10-27 MED ORDER — AMLODIPINE BESYLATE 10 MG PO TABS: 10.0000 mg | ORAL_TABLET | Freq: Every day | ORAL | 3 refills | Status: AC

## 2020-10-27 MED ORDER — SODIUM CHLORIDE 0.9 % IV SOLN
200.0000 mg | Freq: Once | INTRAVENOUS | Status: AC
  Administered 2020-10-27: 200 mg via INTRAVENOUS
  Filled 2020-10-27: qty 8

## 2020-10-27 MED ORDER — SODIUM CHLORIDE 0.9% FLUSH
10.0000 mL | INTRAVENOUS | Status: DC | PRN
  Administered 2020-10-27: 10 mL
  Filled 2020-10-27: qty 10

## 2020-10-27 NOTE — Assessment & Plan Note (Signed)
She is known to have hypercalcemia Her calcium level today is within normal limits

## 2020-10-27 NOTE — Assessment & Plan Note (Signed)
Her blood pressure control is satisfactory She will continue her current prescribed blood pressure medication I refilled multiple prescriptions today

## 2020-10-27 NOTE — Assessment & Plan Note (Signed)
She has uncontrolled hyperglycemia, likely exacerbated by recent treatment I will prescribe a dose of insulin today I recommend aggressive dietary modification and close follow-up with her primary care doctor for medication adjustment

## 2020-10-27 NOTE — Progress Notes (Signed)
Patient discharged in stable condition.

## 2020-10-27 NOTE — Assessment & Plan Note (Signed)
Overall, the palpable lymphadenopathy on the base of her left neck/supraclavicular region is smaller I believe she is responding to treatment I recommend minimum 4 doses of pembrolizumab before repeating imaging study in early January She is in agreement I recommend ordering the CT scan now so that we can get insurance prior authorization

## 2020-10-27 NOTE — Progress Notes (Signed)
[place] OFFICE PROGRESS NOTE  Patient Care Team: [name], DO as PCP - General (Family Medicine)  ASSESSMENT & PLAN:  Uterine cancer ([place]) Overall, the palpable lymphadenopathy on the base of her left neck/supraclavicular region is smaller I believe she is responding to treatment I recommend minimum 4 doses of pembrolizumab before repeating imaging study in early January She is in agreement I recommend ordering the CT scan now so that we can get insurance prior authorization  Diabetes mellitus without complication, without long-term current use of insulin ([place]) She has uncontrolled hyperglycemia, likely exacerbated by recent treatment I will prescribe a dose of insulin today I recommend aggressive dietary modification and close follow-up with her primary care doctor for medication adjustment  Metastasis to lymph nodes ([place]) The size of the lymph node is smaller I plan to order CT imaging in her future study for objective measurement  Essential hypertension Her blood pressure control is satisfactory She will continue her current prescribed blood pressure medication I refilled multiple prescriptions today  Hyperparathyroidism, unspecified ([place]) She is known to have hypercalcemia Her calcium level today is within normal limits   Orders Placed This Encounter  Procedures  . CT CHEST ABDOMEN PELVIS W CONTRAST    Standing Status:   Future    Standing Expiration Date:   10/27/2021    Order Specific Question:   Preferred imaging location?    Answer:   [facility]    Order Specific Question:   Radiology Contrast Protocol - do NOT remove file path    Answer:   [email or web address]    All questions were answered. The patient knows to call the clinic with any problems, questions or concerns. The total time spent in the appointment was 30 minutes encounter with patients including review of chart and various  tests results, discussions about plan of care and coordination of care plan   Heath Lark, MD 10/27/2020 1:56 PM  INTERVAL HISTORY: Please see below for problem oriented charting. 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   01/18/2018 Imaging   CT scan of the chest, abdomen and pelvis elsewhere show lung nodules, kidney lesion as well as 1.5 x 1 cm left, iliac lymph node   01/23/2018 - 06/12/2018 Chemotherapy   The patient had adjuvant chemotherapy with carboplatin and taxol elsewhere for chemotherapy treatment.     06/25/2018 Imaging   CT scan of the chest, abdomen and pelvis showed persistent lung nodules, kidney lesion but reduced size of previously noted pelvic lymphadenopathy to 6 x 4 mm   07/09/2018 - 08/17/2018 Radiation Therapy   She had daily radiation treatment for 5 weeks elsewhere in [place], 28 days treatment, 5040 cGy   09/25/2018 Imaging   CT scan of the chest, abdomen and pelvis shows stable lung nodule and kidney lesion.  Previously noted enlarged lymphadenopathy in the external  iliac region has resolved   01/16/2019 Imaging   CT imaging of the chest, abdomen and pelvis elsewhere showed stable kidney lesions but no evidence of cancer recurrence   05/16/2019 Imaging   Outside CT scan showed stable pulmonary nodules.   Bilateral simple renal cyst with an exophytic lesion in the medial cortex of the lower pole of the right kidney measure 10 mm with enhancement postcontrast.  There is 6 mm lymph node in the left para-aortic region.  There is an enlarging hyper enhancing aortocaval lymph node measuring 5 mm.   08/27/2019 PET scan   There is left supraclavicular lymph node measure 7 mm, another additional supraclavicular region 9 mm with SUV of 8.3.  There is abnormal uptake in retrocrural lymph nodes, SUV 3.7.  There is elevated FDG uptake in the patient's known retroperitoneal lymph nodes.  There is a small solid-appearing right inferior medial renal lesion with no FDG uptake.   09/13/2019 Procedure   She underwent biopsy of 2 left supraclavicular lymph node elsewhere   09/13/2019 Pathology Results   Immunohistochemical stains performed on the biopsy report showed that the tumor cells are strongly and diffusely positive for p16, PAX 8, p53, CK7, pancytokeratin and beta-catenin.  CA-125, CK20 and TTF-1 are negative.  Ki-67 stains approximately 50% of tumor cells.  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   10/09/2019 -  Chemotherapy   The patient had docetaxel and carboplatin for chemotherapy treatment elsewhere in [place].  Her treatment was initially given weekly.  Cycle 1 was complicated by neutropenia.  Cycle 2 was dose adjusted and delay.  Cycle 2 treatment was complicated by loss of appetite, severe diarrhea and persistent neutropenia.  Cycle 3 was delay as well.  She went to the emergency department twice for IV fluid hydration and have lost a lot of weight.    12/18/2019 Imaging   CT chest, abdomen and pelvis 1. Interval resolution of the left supraclavicular, retroperitoneal, and right retrocrural lymphadenopathy seen on previous diagnostic CT and PET-CT. Tiny lymph nodes are seen in the retroperitoneal region today. 2. Tiny bilateral  pulmonary nodules, indeterminate. These do not appear substantially changed although we do not have a prior diagnostic CT chest to compare. Previous PET-CT is a free breathing exam which obscure small pulmonary nodules. Attention on follow-up recommended. 3. Bilateral interstitial and patchy peripheral ground-glass opacity in both lungs, nonspecific but potentially related to infectious/inflammatory process or scarring. 4. 10 mm enhancing lesion lower pole right kidney, suspicious for neoplasm. MRI without and with contrast recommended to further evaluate. 5. Small hiatal hernia. 6. Enlargement of the pulmonary outflow tract and main pulmonary arteries compatible with pulmonary arterial hypertension.   03/09/2020 Imaging   1. No findings suspicious for metastatic disease in the chest, abdomen or pelvis. 2. Small pulmonary nodules are all stable and warrant continued chest CT follow-up. 3. Stable indeterminate exophytic 1.2 cm renal cortical lesion in the lateral lower left kidney, renal cell carcinoma not excluded. Dedicated MRI abdomen without and with IV contrast may be obtained for further evaluation as clinically warranted. 4. Chronic findings include: Stable dilated main pulmonary artery, suggesting pulmonary arterial hypertension. Small hiatal hernia. Mild left colonic diverticulosis. Two-vessel coronary atherosclerosis. Aortic Atherosclerosis (ICD10-I70.0).     06/04/2020 Imaging   1. Interval progression of small retroperitoneal lymph nodes. While not technically enlarged by CT criteria, the interval progression is concerning. The enlargement of these nodes is more apparent when comparing back to  the older study from 12/18/2019 and they are now similar in size to the abnormal lymph nodes seen on the outside imaging of 12/03/2019. Close follow-up recommended. 2. Bilateral pulmonary nodules, similar to prior. The 6 mm right lower lobe nodule appears more confluent than on the prior study and is  slightly increased from 5 mm when I remeasure on the previous exam. Close attention on follow-up recommended. 3. Small hiatal hernia. 4. Left colonic diverticulosis without diverticulitis. 5. Aortic Atherosclerosis (ICD10-I70.0).   08/31/2020 Imaging   1. Today's study is again concerning for progressive metastatic disease both with increased number and size of numerous small pulmonary nodules in the visualize lung bases, as well as increased number and size of numerous retroperitoneal lymph nodes, as detailed above. 2. Multiple cystic lesions in the kidneys bilaterally, favored to represent a combination of Bosniak class 1 and Bosniak class 2 cysts, as detailed above. 3. Colonic diverticulosis without evidence of acute diverticulitis at this time. 4. Cardiomegaly with left atrial dilatation. 5. Small hiatal hernia. 6. Aortic atherosclerosis.     09/15/2020 -  Chemotherapy   The patient had lenvatinib 20 mg daily dose (LENVIMA) 2 x 10 MG capsule, 10 mg (50 % of original dose 20 mg), Oral, Daily, 1 of 1 cycle, Start date: 09/02/2020, End date: 09/02/2020 Dose modification: 10 mg (original dose 20 mg, Cycle 0) pembrolizumab (KEYTRUDA) 200 mg in sodium chloride 0.9 % 50 mL chemo infusion, 200 mg, Intravenous, Once, 3 of 6 cycles Administration: 200 mg (09/15/2020), 200 mg (10/27/2020), 200 mg (10/06/2020)  for chemotherapy treatment.      REVIEW OF SYSTEMS:   Constitutional: Denies fevers, chills or abnormal weight loss Eyes: Denies blurriness of vision Ears, nose, mouth, throat, and face: Denies mucositis or sore throat Respiratory: Denies cough, dyspnea or wheezes Cardiovascular: Denies palpitation, chest discomfort or lower extremity swelling Gastrointestinal:  Denies nausea, heartburn or change in bowel habits Skin: Denies abnormal skin rashes Lymphatics: Denies new lymphadenopathy or easy bruising Neurological:Denies numbness, tingling or new weaknesses Behavioral/Psych: Mood is  stable, no new changes  All other systems were reviewed with the patient and are negative.  I have reviewed the past medical history, past surgical history, social history and family history with the patient and they are unchanged from previous note.  ALLERGIES:  is allergic to verapamil.  MEDICATIONS:  Current Outpatient Medications  Medication Sig Dispense Refill  . amLODipine (NORVASC) 10 MG tablet Take 1 tablet (10 mg total) by mouth daily. 90 tablet 3  . aspirin EC 81 MG tablet Take 81 mg by mouth daily.    . clonazePAM (KLONOPIN) 0.5 MG tablet Take 1 tablet (0.5 mg total) by mouth at bedtime. 30 tablet 4  . dapagliflozin propanediol (FARXIGA) 10 MG TABS tablet Take 1 tablet (10 mg total) by mouth daily before breakfast. 90 tablet 2  . glipiZIDE (GLUCOTROL) 5 MG tablet Take 0.5 tablets by mouth 2 (two) times daily.    . hydrALAZINE (APRESOLINE) 25 MG tablet Take 1 tablet (25 mg total) by mouth 3 (three) times daily. 270 tablet 3  . hydrochlorothiazide (HYDRODIURIL) 25 MG tablet Take 1 tablet (25 mg total) by mouth daily. 90 tablet 3  . lenvatinib 10 mg daily dose (LENVIMA, 10 MG DAILY DOSE,) capsule Take 1 capsule (10 mg total) by mouth daily. 30 capsule 11  . lidocaine-prilocaine (EMLA) cream Apply 1 application topically as needed.    . lidocaine-prilocaine (EMLA) cream Apply to affected area once 30 g 3  . losartan (  COZAAR) 100 MG tablet Take 100 mg by mouth daily.    . metoprolol succinate (TOPROL-XL) 100 MG 24 hr tablet Take 100 mg by mouth daily. Take with or immediately following a meal.    . ondansetron (ZOFRAN) 8 MG tablet Take 1 tablet (8 mg total) by mouth every 8 (eight) hours as needed (Nausea or vomiting). 30 tablet 1  . oxyCODONE (OXY IR/ROXICODONE) 5 MG immediate release tablet Take 1 tablet (5 mg total) by mouth every 4 (four) hours as needed for severe pain. 30 tablet 0  . prochlorperazine (COMPAZINE) 10 MG tablet Take 1 tablet (10 mg total) by mouth every 6 (six) hours  as needed (Nausea or vomiting). 30 tablet 1  . rosuvastatin (CRESTOR) 10 MG tablet Take 1 tablet (10 mg total) by mouth daily. 90 tablet 2  . terazosin (HYTRIN) 2 MG capsule Take 1 capsule (2 mg total) by mouth at bedtime. 30 capsule 1   No current facility-administered medications for this visit.   Facility-Administered Medications Ordered in Other Visits  Medication Dose Route Frequency Provider Last Rate Last Admin  . sodium chloride flush (NS) 0.9 % injection 10 mL  10 mL Intracatheter PRN Alvy Bimler, Leah Thornberry, MD   10 mL at 10/27/20 1310    PHYSICAL EXAMINATION: ECOG PERFORMANCE STATUS: 1 - Symptomatic but completely ambulatory  Vitals:   10/27/20 1117  BP: 130/69  Pulse: (!) 58  Resp: 18  Temp: (!) 97.5 F (36.4 C)  SpO2: 98%   Filed Weights   10/27/20 1117  Weight: 157 lb (71.2 kg)    GENERAL:alert, no distress and comfortable SKIN: skin color, texture, turgor are normal, no rashes or significant lesions EYES: normal, Conjunctiva are pink and non-injected, sclera clear OROPHARYNX:no exudate, no erythema and lips, buccal mucosa, and tongue normal  NECK: supple, thyroid normal size, non-tender, without nodularity LYMPH: Previously palpable left supraclavicular lymphadenopathy is smaller LUNGS: clear to auscultation and percussion with normal breathing effort HEART: regular rate & rhythm and no murmurs and no lower extremity edema ABDOMEN:abdomen soft, non-tender and normal bowel sounds Musculoskeletal:no cyanosis of digits and no clubbing  NEURO: alert & oriented x 3 with fluent speech, no focal motor/sensory deficits  LABORATORY DATA:  I have reviewed the data as listed    Component Value Date/Time   NA 137 10/27/2020 1056   K 3.4 (L) 10/27/2020 1056   CL 102 10/27/2020 1056   CO2 24 10/27/2020 1056   GLUCOSE 327 (H) 10/27/2020 1056   BUN 22 10/27/2020 1056   CREATININE 1.01 (H) 10/27/2020 1056   CALCIUM 10.3 10/27/2020 1056   PROT 6.5 10/27/2020 1056   ALBUMIN 3.7  10/27/2020 1056   AST 17 10/27/2020 1056   ALT 17 10/27/2020 1056   ALKPHOS 68 10/27/2020 1056   BILITOT 0.8 10/27/2020 1056   GFRNONAA 60 (L) 10/27/2020 1056   GFRAA >60 08/31/2020 1022    No results found for: SPEP, UPEP  Lab Results  Component Value Date   WBC 4.0 10/27/2020   NEUTROABS 2.6 10/27/2020   HGB 12.5 10/27/2020   HCT 36.4 10/27/2020   MCV 89.9 10/27/2020   PLT 172 10/27/2020      Chemistry      Component Value Date/Time   NA 137 10/27/2020 1056   K 3.4 (L) 10/27/2020 1056   CL 102 10/27/2020 1056   CO2 24 10/27/2020 1056   BUN 22 10/27/2020 1056   CREATININE 1.01 (H) 10/27/2020 1056      Component Value Date/Time  CALCIUM 10.3 10/27/2020 1056   ALKPHOS 68 10/27/2020 1056   AST 17 10/27/2020 1056   ALT 17 10/27/2020 1056   BILITOT 0.8 10/27/2020 1056

## 2020-10-27 NOTE — Assessment & Plan Note (Signed)
The size of the lymph node is smaller I plan to order CT imaging in her future study for objective measurement

## 2020-10-27 NOTE — Patient Instructions (Signed)
[facility] Discharge Instructions for Patients Receiving Chemotherapy  Today you received the following chemotherapy agents: pembrolizumab.  To help prevent nausea and vomiting after your treatment, we encourage you to take your nausea medication as directed.   If you develop nausea and vomiting that is not controlled by your nausea medication, call the clinic.   BELOW ARE SYMPTOMS THAT SHOULD BE REPORTED IMMEDIATELY:  *FEVER GREATER THAN 100.5 F  *CHILLS WITH OR WITHOUT FEVER  NAUSEA AND VOMITING THAT IS NOT CONTROLLED WITH YOUR NAUSEA MEDICATION  *UNUSUAL SHORTNESS OF BREATH  *UNUSUAL BRUISING OR BLEEDING  TENDERNESS IN MOUTH AND THROAT WITH OR WITHOUT PRESENCE OF ULCERS  *URINARY PROBLEMS  *BOWEL PROBLEMS  UNUSUAL RASH Items with * indicate a potential emergency and should be followed up as soon as possible.  Feel free to call the clinic should you have any questions or concerns. The clinic phone number is [phone].  Please show the CHEMO ALERT CARD at check-in to the Emergency Department and triage nurse.   

## 2020-10-27 NOTE — Telephone Encounter (Signed)
Scheduled appts per 11/30 los. Gave pt a print out of AVS.  

## 2020-10-28 LAB — T4: T4, Total: 10 ug/dL (ref 4.5–12.0)

## 2020-11-03 NOTE — Telephone Encounter (Signed)
Oral Oncology Patient Advocate Encounter  Met patient in lobby to complete re-enrollment application for [employer] in an effort to reduce patient's out of pocket expense for [place] to $0.    Application completed and faxed to [phone].   [employer] patient assistance phone number for follow up is [phone].   This encounter will be updated until final determination.    [place] [facility] [place] Phone [phone] Fax [phone] 11/03/2020 11:03 AM

## 2020-11-04 ENCOUNTER — Ambulatory Visit: Payer: 59 | Admitting: Family Medicine

## 2020-11-11 ENCOUNTER — Ambulatory Visit: Payer: 59 | Admitting: Family Medicine

## 2020-11-13 ENCOUNTER — Encounter: Payer: Self-pay | Admitting: Family Medicine

## 2020-11-13 ENCOUNTER — Ambulatory Visit (INDEPENDENT_AMBULATORY_CARE_PROVIDER_SITE_OTHER): Payer: Medicare Other | Admitting: Family Medicine

## 2020-11-13 ENCOUNTER — Other Ambulatory Visit: Payer: Self-pay

## 2020-11-13 VITALS — BP 128/78 | HR 74 | Temp 98.0°F | Ht 62.5 in | Wt 152.1 lb

## 2020-11-13 DIAGNOSIS — E1165 Type 2 diabetes mellitus with hyperglycemia: Secondary | ICD-10-CM | POA: Diagnosis not present

## 2020-11-13 DIAGNOSIS — J3489 Other specified disorders of nose and nasal sinuses: Secondary | ICD-10-CM

## 2020-11-13 NOTE — Progress Notes (Signed)
Chief Complaint  Patient presents with  . Follow-up    medication    Evalena Fujii is a 69 y.o. female here for a skin complaint.  Duration: 2 weeks Location: nose Pruritic? No Painful? Yes Drainage? No New soaps/lotions/topicals/detergents? No Sick contacts? No Other associated symptoms: scabbing Therapies tried thus far: none  Sugars running in mid 100's. Wondering if she needs to go on another medication. Has drastically improved her diet and brought down her sugars. Cancer tx's thought to be contributing.   Past Medical History:  Diagnosis Date  . Cancer ([place]) 2019  . Diabetes mellitus without complication ([place])   . Hyperparathyroidism, unspecified ([place])   . Hypertension     BP 128/78 (BP Location: Left Arm, Patient Position: Sitting, Cuff Size: Normal)   Pulse 74   Temp 98 F (36.7 C) (Oral)   Ht 5' 2.5" (1.588 m)   Wt 152 lb 2 oz (69 kg)   SpO2 97%   BMI 27.38 kg/m  Gen: awake, alert, appearing stated age Lungs: No accessory muscle use Nose: No ext lesions, excoriated lesion on R nostril along septum. L nare neg.  Psych: Age appropriate judgment and insight  Sore in nose  Type 2 diabetes mellitus with hyperglycemia, without long-term current use of insulin ([facility])  1. Bid TAO. Air humidifier. Avoid digital trauma. Send message if no better in 7-10 d.  2. OK to cont to monitor sugars. She has done an excellent job. She will drop off a handicap placard form as she cannot walk long distances without stopping to rest 2/2 cancer tx.  F/u as originally scheduled. The patient voiced understanding and agreement to the plan.  [place], DO 11/13/20 9:36 AM

## 2020-11-13 NOTE — Patient Instructions (Signed)
Drop off the form for a handicap placard at any time. Make sure your portion is filled out.  Use triple antibiotic ointment (Neosporin) twice daily for the next 7-10 days. Use an air humidifier.  Your sugars look great. Strong work with your dietary changes.   Let us know if you need anything.

## 2020-11-16 ENCOUNTER — Other Ambulatory Visit: Payer: Self-pay | Admitting: Hematology and Oncology

## 2020-11-16 ENCOUNTER — Inpatient Hospital Stay (HOSPITAL_BASED_OUTPATIENT_CLINIC_OR_DEPARTMENT_OTHER): Payer: Medicare Other | Admitting: Hematology and Oncology

## 2020-11-16 ENCOUNTER — Encounter: Payer: Self-pay | Admitting: Hematology and Oncology

## 2020-11-16 ENCOUNTER — Inpatient Hospital Stay: Payer: Medicare Other

## 2020-11-16 ENCOUNTER — Inpatient Hospital Stay: Payer: Medicare Other | Attending: Hematology and Oncology

## 2020-11-16 ENCOUNTER — Other Ambulatory Visit: Payer: Self-pay

## 2020-11-16 DIAGNOSIS — I10 Essential (primary) hypertension: Secondary | ICD-10-CM | POA: Insufficient documentation

## 2020-11-16 DIAGNOSIS — C55 Malignant neoplasm of uterus, part unspecified: Secondary | ICD-10-CM | POA: Diagnosis present

## 2020-11-16 DIAGNOSIS — Z5112 Encounter for antineoplastic immunotherapy: Secondary | ICD-10-CM | POA: Diagnosis not present

## 2020-11-16 DIAGNOSIS — C778 Secondary and unspecified malignant neoplasm of lymph nodes of multiple regions: Secondary | ICD-10-CM

## 2020-11-16 DIAGNOSIS — Z95828 Presence of other vascular implants and grafts: Secondary | ICD-10-CM

## 2020-11-16 LAB — CBC WITH DIFFERENTIAL (CANCER CENTER ONLY)
Abs Immature Granulocytes: 0.01 10*3/uL (ref 0.00–0.07)
Basophils Absolute: 0 10*3/uL (ref 0.0–0.1)
Basophils Relative: 1 %
Eosinophils Absolute: 0 10*3/uL (ref 0.0–0.5)
Eosinophils Relative: 1 %
HCT: 38.6 % (ref 36.0–46.0)
Hemoglobin: 13.6 g/dL (ref 12.0–15.0)
Immature Granulocytes: 0 %
Lymphocytes Relative: 27 %
Lymphs Abs: 1.4 10*3/uL (ref 0.7–4.0)
MCH: 31.3 pg (ref 26.0–34.0)
MCHC: 35.2 g/dL (ref 30.0–36.0)
MCV: 88.9 fL (ref 80.0–100.0)
Monocytes Absolute: 0.5 10*3/uL (ref 0.1–1.0)
Monocytes Relative: 10 %
Neutro Abs: 3.1 10*3/uL (ref 1.7–7.7)
Neutrophils Relative %: 61 %
Platelet Count: 197 10*3/uL (ref 150–400)
RBC: 4.34 MIL/uL (ref 3.87–5.11)
RDW: 13.2 % (ref 11.5–15.5)
WBC Count: 5.1 10*3/uL (ref 4.0–10.5)
nRBC: 0 % (ref 0.0–0.2)

## 2020-11-16 LAB — CMP (CANCER CENTER ONLY)
ALT: 22 U/L (ref 0–44)
AST: 20 U/L (ref 15–41)
Albumin: 3.8 g/dL (ref 3.5–5.0)
Alkaline Phosphatase: 58 U/L (ref 38–126)
Anion gap: 10 (ref 5–15)
BUN: 23 mg/dL (ref 8–23)
CO2: 27 mmol/L (ref 22–32)
Calcium: 10.3 mg/dL (ref 8.9–10.3)
Chloride: 102 mmol/L (ref 98–111)
Creatinine: 1.16 mg/dL — ABNORMAL HIGH (ref 0.44–1.00)
GFR, Estimated: 50 mL/min — ABNORMAL LOW (ref >60)
Glucose, Bld: 187 mg/dL — ABNORMAL HIGH (ref 70–99)
Potassium: 3.6 mmol/L (ref 3.5–5.1)
Sodium: 139 mmol/L (ref 135–145)
Total Bilirubin: 0.8 mg/dL (ref 0.3–1.2)
Total Protein: 6.8 g/dL (ref 6.5–8.1)

## 2020-11-16 LAB — TOTAL PROTEIN, URINE DIPSTICK: Protein, ur: 30 mg/dL — AB

## 2020-11-16 LAB — TSH: TSH: 0.452 u[IU]/mL (ref 0.308–3.960)

## 2020-11-16 MED ORDER — SODIUM CHLORIDE 0.9% FLUSH
10.0000 mL | INTRAVENOUS | Status: DC | PRN
  Administered 2020-11-16: 10 mL
  Filled 2020-11-16: qty 10

## 2020-11-16 MED ORDER — HEPARIN SOD (PORK) LOCK FLUSH 100 UNIT/ML IV SOLN
500.0000 [IU] | Freq: Once | INTRAVENOUS | Status: AC | PRN
  Administered 2020-11-16: 500 [IU]
  Filled 2020-11-16: qty 5

## 2020-11-16 MED ORDER — SODIUM CHLORIDE 0.9 % IV SOLN
Freq: Once | INTRAVENOUS | Status: AC
  Filled 2020-11-16: qty 250

## 2020-11-16 MED ORDER — HYDROCODONE-ACETAMINOPHEN 5-325 MG PO TABS: 1.0000 | ORAL_TABLET | Freq: Four times a day (QID) | ORAL | 0 refills | Status: DC | PRN

## 2020-11-16 MED ORDER — SODIUM CHLORIDE 0.9 % IV SOLN
200.0000 mg | Freq: Once | INTRAVENOUS | Status: AC
  Administered 2020-11-16: 200 mg via INTRAVENOUS
  Filled 2020-11-16: qty 8

## 2020-11-16 MED FILL — HYDROCODON-APAP 5-325: 5-325 | 15 days supply | Qty: 60 | Fill #0

## 2020-11-16 NOTE — Patient Instructions (Signed)

## 2020-11-16 NOTE — Assessment & Plan Note (Signed)
The size of the lymph node is smaller I plan to order CT imaging in her future study for objective measurement

## 2020-11-16 NOTE — Assessment & Plan Note (Signed)
Overall, the palpable lymphadenopathy on the base of her left neck/supraclavicular region is smaller I believe she is responding to treatment We will proceed with treatment today with plans for imaging study next month For now, I do not recommend her to proceed with any kind of elective surgery and to we can definitively assess response to therapy objectively with CT imaging

## 2020-11-16 NOTE — Progress Notes (Signed)
[facility] OFFICE PROGRESS NOTE  Patient Care Team: [name], DO as PCP - General (Family Medicine)  ASSESSMENT & PLAN:  Uterine cancer ([place]) Overall, the palpable lymphadenopathy on the base of her left neck/supraclavicular region is smaller I believe she is responding to treatment We will proceed with treatment today with plans for imaging study next month For now, I do not recommend her to proceed with any kind of elective surgery and to we can definitively assess response to therapy objectively with CT imaging  Metastasis to lymph nodes (HCC) The size of the lymph node is smaller I plan to order CT imaging in her future study for objective measurement  Essential hypertension Her blood pressure control is satisfactory She will continue her current prescribed blood pressure medication   No orders of the defined types were placed in this encounter.   All questions were answered. The patient knows to call the clinic with any problems, questions or concerns. The total time spent in the appointment was 20 minutes encounter with patients including review of chart and various tests results, discussions about plan of care and coordination of care plan   Heath Lark, MD 11/16/2020 3:10 PM  INTERVAL HISTORY: Please see below for problem oriented charting. 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   01/18/2018 Imaging   CT scan of the chest, abdomen and pelvis elsewhere show lung nodules, kidney lesion as well as 1.5 x 1 cm left, iliac lymph node   01/23/2018 - 06/12/2018 Chemotherapy   The patient had adjuvant chemotherapy with carboplatin and taxol elsewhere for chemotherapy treatment.     06/25/2018 Imaging   CT scan of the chest, abdomen and pelvis showed persistent lung nodules, kidney lesion but reduced size of previously noted pelvic lymphadenopathy to 6 x 4 mm   07/09/2018 - 08/17/2018 Radiation Therapy   She had daily radiation treatment for 5 weeks elsewhere in [place], 28 days treatment, 5040 cGy   09/25/2018 Imaging   CT scan of the chest, abdomen and pelvis shows stable lung nodule and kidney lesion.  Previously noted enlarged lymphadenopathy in the external iliac region has resolved   01/16/2019 Imaging   CT imaging of the chest, abdomen and pelvis elsewhere showed stable kidney lesions but no evidence of cancer recurrence   05/16/2019 Imaging   Outside CT scan showed stable pulmonary nodules.  Bilateral simple renal cyst with an exophytic lesion in the medial cortex of the lower pole of the right kidney measure 10 mm with enhancement postcontrast.  There is 6 mm lymph node in the left para-aortic region.  There is an enlarging hyper enhancing aortocaval lymph node measuring 5  mm.   08/27/2019 PET scan   There is left supraclavicular lymph node measure 7 mm, another additional supraclavicular region 9 mm with SUV of 8.3.  There is abnormal uptake in retrocrural lymph nodes, SUV 3.7.  There is elevated FDG uptake in the patient's known retroperitoneal lymph nodes.  There is  a small solid-appearing right inferior medial renal lesion with no FDG uptake.   09/13/2019 Procedure   She underwent biopsy of 2 left supraclavicular lymph node elsewhere   09/13/2019 Pathology Results   Immunohistochemical stains performed on the biopsy report showed that the tumor cells are strongly and diffusely positive for p16, PAX 8, p53, CK7, pancytokeratin and beta-catenin.  CA-125, CK20 and TTF-1 are negative.  Ki-67 stains approximately 50% of tumor cells.  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   10/09/2019 -  Chemotherapy   The patient had docetaxel and carboplatin for chemotherapy treatment elsewhere in [place].  Her treatment was initially given weekly.  Cycle 1 was complicated by neutropenia.  Cycle 2 was dose adjusted and delay.  Cycle 2 treatment was complicated by loss of appetite, severe diarrhea and persistent neutropenia.  Cycle 3 was delay as well.  She went to the emergency department twice for IV fluid hydration and have lost a lot of weight.    12/18/2019 Imaging   CT chest, abdomen and pelvis 1. Interval resolution of the left supraclavicular, retroperitoneal, and right retrocrural lymphadenopathy seen on previous diagnostic CT and PET-CT. Tiny lymph nodes are seen in the retroperitoneal region today. 2. Tiny bilateral pulmonary nodules, indeterminate. These do not appear substantially changed although we do not have a prior diagnostic CT chest to compare. Previous PET-CT is a free breathing exam which obscure small pulmonary nodules. Attention on follow-up recommended. 3. Bilateral interstitial and patchy peripheral ground-glass opacity in both lungs, nonspecific but potentially related to infectious/inflammatory process or scarring. 4. 10 mm enhancing lesion lower pole right kidney, suspicious for neoplasm. MRI without and with contrast recommended to further evaluate. 5. Small hiatal  hernia. 6. Enlargement of the pulmonary outflow tract and main pulmonary arteries compatible with pulmonary arterial hypertension.   03/09/2020 Imaging   1. No findings suspicious for metastatic disease in the chest, abdomen or pelvis. 2. Small pulmonary nodules are all stable and warrant continued chest CT follow-up. 3. Stable indeterminate exophytic 1.2 cm renal cortical lesion in the lateral lower left kidney, renal cell carcinoma not excluded. Dedicated MRI abdomen without and with IV contrast may be obtained for further evaluation as clinically warranted. 4. Chronic findings include: Stable dilated main pulmonary artery, suggesting pulmonary arterial hypertension. Small hiatal hernia. Mild left colonic diverticulosis. Two-vessel coronary atherosclerosis. Aortic Atherosclerosis (ICD10-I70.0).     06/04/2020 Imaging   1. Interval progression of small retroperitoneal lymph nodes. While not technically enlarged by CT criteria, the interval progression is concerning. The enlargement of these nodes is more apparent when comparing back to the older study from 12/18/2019 and they are now similar in size to the abnormal lymph nodes seen on the outside imaging of 12/03/2019. Close follow-up recommended. 2. Bilateral pulmonary nodules, similar to prior. The 6 mm right lower lobe nodule appears more confluent than on the prior study and is slightly increased from 5 mm when I remeasure on the previous exam. Close attention on follow-up recommended. 3. Small hiatal hernia. 4. Left colonic diverticulosis without diverticulitis. 5. Aortic Atherosclerosis (ICD10-I70.0).   08/31/2020 Imaging   1. Today's study is again  concerning for progressive metastatic disease both with increased number and size of numerous small pulmonary nodules in the visualize lung bases, as well as increased number and size of numerous retroperitoneal lymph nodes, as detailed above. 2. 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 3. Colonic diverticulosis without evidence of acute diverticulitis at this time. 4. Cardiomegaly with left atrial dilatation. 5. Small hiatal hernia. 6. Aortic atherosclerosis.     09/15/2020 -  Chemotherapy   The patient had lenvatinib 20 mg daily dose (LENVIMA) 2 x 10 MG capsule, 10 mg (50 % of original dose 20 mg), Oral, Daily, 1 of 1 cycle, Start date: 09/02/2020, End date: 09/02/2020 Dose modification: 10 mg (original dose 20 mg, Cycle 0) pembrolizumab (KEYTRUDA) 200 mg in sodium chloride 0.9 % 50 mL chemo infusion, 200 mg, Intravenous, Once, 4 of 6 cycles Administration: 200 mg (09/15/2020), 200 mg (10/27/2020), 200 mg (10/06/2020)  for chemotherapy treatment.      REVIEW OF SYSTEMS:   Constitutional: Denies fevers, chills or abnormal weight loss Eyes: Denies blurriness of vision Ears, nose, mouth, throat, and face: Denies mucositis or sore throat Respiratory: Denies cough, dyspnea or wheezes Cardiovascular: Denies palpitation, chest discomfort or lower extremity swelling Gastrointestinal:  Denies nausea, heartburn or change in bowel habits Skin: Denies abnormal skin rashes Lymphatics: Denies new lymphadenopathy or easy bruising Neurological:Denies numbness, tingling or new weaknesses Behavioral/Psych: Mood is stable, no new changes  All other systems were reviewed with the patient and are negative.  I have reviewed the past medical history, past surgical history, social history and family history with the patient and they are unchanged from previous note.  ALLERGIES:  is allergic to lisinopril, verapamil, and other.  MEDICATIONS:  Current Outpatient Medications  Medication Sig Dispense Refill  . amLODipine (NORVASC) 10 MG tablet Take 1 tablet (10 mg total) by mouth daily. 90 tablet 3  . aspirin EC 81 MG tablet Take 81 mg by mouth daily.    . clonazePAM (KLONOPIN) 0.5 MG tablet  Take 1 tablet (0.5 mg total) by mouth at bedtime. 30 tablet 4  . glipiZIDE (GLUCOTROL) 5 MG tablet Take 0.5 tablets by mouth 2 (two) times daily.    . hydrALAZINE (APRESOLINE) 25 MG tablet Take 1 tablet (25 mg total) by mouth 3 (three) times daily. 270 tablet 3  . hydrochlorothiazide (HYDRODIURIL) 25 MG tablet Take 1 tablet (25 mg total) by mouth daily. 90 tablet 3  . HYDROcodone-acetaminophen (NORCO/VICODIN) 5-325 MG tablet Take 1 tablet by mouth every 6 (six) hours as needed. 60 tablet 0  . lenvatinib 10 mg daily dose (LENVIMA, 10 MG DAILY DOSE,) capsule Take 1 capsule (10 mg total) by mouth daily. 30 capsule 11  . lidocaine-prilocaine (EMLA) cream Apply 1 application topically as needed.    . lidocaine-prilocaine (EMLA) cream Apply to affected area once 30 g 3  . losartan (COZAAR) 100 MG tablet Take 100 mg by mouth daily.    . metoprolol succinate (TOPROL-XL) 100 MG 24 hr tablet Take 100 mg by mouth daily. Take with or immediately following a meal.    . ondansetron (ZOFRAN) 8 MG tablet Take 1 tablet (8 mg total) by mouth every 8 (eight) hours as needed (Nausea or vomiting). 30 tablet 1  . prochlorperazine (COMPAZINE) 10 MG tablet Take 1 tablet (10 mg total) by mouth every 6 (six) hours as needed (Nausea or vomiting). 30 tablet 1  .  rosuvastatin (CRESTOR) 10 MG tablet Take 1 tablet (10 mg total) by mouth daily. 90 tablet 2  . terazosin (HYTRIN) 2 MG capsule Take 1 capsule (2 mg total) by mouth at bedtime. 30 capsule 1   No current facility-administered medications for this visit.   Facility-Administered Medications Ordered in Other Visits  Medication Dose Route Frequency Provider Last Rate Last Admin  . heparin lock flush 100 unit/mL  500 Units Intracatheter Once PRN Alvy Bimler, Ni, MD      . pembrolizumab (KEYTRUDA) 200 mg in sodium chloride 0.9 % 50 mL chemo infusion  200 mg Intravenous Once Alvy Bimler, Ni, MD 116 mL/hr at 11/16/20 1506 200 mg at 11/16/20 1506  . sodium chloride flush (NS) 0.9 %  injection 10 mL  10 mL Intracatheter PRN Alvy Bimler, Ni, MD        PHYSICAL EXAMINATION: ECOG PERFORMANCE STATUS: 1 - Symptomatic but completely ambulatory  Vitals:   11/16/20 1240  BP: 128/66  Pulse: 60  Resp: 18  Temp: (!) 97.2 F (36.2 C)  SpO2: 99%   Filed Weights   11/16/20 1240  Weight: 152 lb (68.9 kg)    GENERAL:alert, no distress and comfortable SKIN: skin color, texture, turgor are normal, no rashes or significant lesions EYES: normal, Conjunctiva are pink and non-injected, sclera clear OROPHARYNX:no exudate, no erythema and lips, buccal mucosa, and tongue normal  NECK: supple, thyroid normal size, non-tender, without nodularity LYMPH: She has persistent palpable lymphadenopathy in the left supraclavicular region  LUNGS: clear to auscultation and percussion with normal breathing effort HEART: regular rate & rhythm and no murmurs and no lower extremity edema ABDOMEN:abdomen soft, non-tender and normal bowel sounds Musculoskeletal:no cyanosis of digits and no clubbing  NEURO: alert & oriented x 3 with fluent speech, no focal motor/sensory deficits  LABORATORY DATA:  I have reviewed the data as listed    Component Value Date/Time   NA 139 11/16/2020 1230   K 3.6 11/16/2020 1230   CL 102 11/16/2020 1230   CO2 27 11/16/2020 1230   GLUCOSE 187 (H) 11/16/2020 1230   BUN 23 11/16/2020 1230   CREATININE 1.16 (H) 11/16/2020 1230   CALCIUM 10.3 11/16/2020 1230   PROT 6.8 11/16/2020 1230   ALBUMIN 3.8 11/16/2020 1230   AST 20 11/16/2020 1230   ALT 22 11/16/2020 1230   ALKPHOS 58 11/16/2020 1230   BILITOT 0.8 11/16/2020 1230   GFRNONAA 50 (L) 11/16/2020 1230   GFRAA >60 08/31/2020 1022    No results found for: SPEP, UPEP  Lab Results  Component Value Date   WBC 5.1 11/16/2020   NEUTROABS 3.1 11/16/2020   HGB 13.6 11/16/2020   HCT 38.6 11/16/2020   MCV 88.9 11/16/2020   PLT 197 11/16/2020      Chemistry      Component Value Date/Time   NA 139 11/16/2020  1230   K 3.6 11/16/2020 1230   CL 102 11/16/2020 1230   CO2 27 11/16/2020 1230   BUN 23 11/16/2020 1230   CREATININE 1.16 (H) 11/16/2020 1230      Component Value Date/Time   CALCIUM 10.3 11/16/2020 1230   ALKPHOS 58 11/16/2020 1230   AST 20 11/16/2020 1230   ALT 22 11/16/2020 1230   BILITOT 0.8 11/16/2020 1230

## 2020-11-16 NOTE — Patient Instructions (Signed)
[facility] Discharge Instructions for Patients Receiving Chemotherapy  Today you received the following chemotherapy agent: pembrolizumab.   To help prevent nausea and vomiting after your treatment, we encourage you to take your nausea medication as directed.    If you develop nausea and vomiting that is not controlled by your nausea medication, call the clinic.   BELOW ARE SYMPTOMS THAT SHOULD BE REPORTED IMMEDIATELY:  *FEVER GREATER THAN 100.5 F  *CHILLS WITH OR WITHOUT FEVER  NAUSEA AND VOMITING THAT IS NOT CONTROLLED WITH YOUR NAUSEA MEDICATION  *UNUSUAL SHORTNESS OF BREATH  *UNUSUAL BRUISING OR BLEEDING  TENDERNESS IN MOUTH AND THROAT WITH OR WITHOUT PRESENCE OF ULCERS  *URINARY PROBLEMS  *BOWEL PROBLEMS  UNUSUAL RASH Items with * indicate a potential emergency and should be followed up as soon as possible.  Feel free to call the clinic should you have any questions or concerns. The clinic phone number is [phone].  Please show the CHEMO ALERT CARD at check-in to the Emergency Department and triage nurse.   

## 2020-11-16 NOTE — Assessment & Plan Note (Signed)
Her blood pressure control is satisfactory She will continue her current prescribed blood pressure medication

## 2020-11-17 LAB — T4: T4, Total: 8.7 ug/dL (ref 4.5–12.0)

## 2020-11-30 ENCOUNTER — Ambulatory Visit (HOSPITAL_COMMUNITY)
Admission: RE | Admit: 2020-11-30 | Discharge: 2020-11-30 | Disposition: A | Discharge status: Home / Self Care | Payer: Medicare Other | Source: Ambulatory Visit | Attending: Hematology and Oncology | Admitting: Hematology and Oncology

## 2020-11-30 ENCOUNTER — Other Ambulatory Visit: Payer: Self-pay

## 2020-11-30 ENCOUNTER — Encounter (HOSPITAL_COMMUNITY): Payer: Self-pay

## 2020-11-30 DIAGNOSIS — C7801 Secondary malignant neoplasm of right lung: Secondary | ICD-10-CM | POA: Insufficient documentation

## 2020-11-30 DIAGNOSIS — N289 Disorder of kidney and ureter, unspecified: Secondary | ICD-10-CM | POA: Diagnosis present

## 2020-11-30 DIAGNOSIS — C55 Malignant neoplasm of uterus, part unspecified: Secondary | ICD-10-CM

## 2020-11-30 DIAGNOSIS — C778 Secondary and unspecified malignant neoplasm of lymph nodes of multiple regions: Secondary | ICD-10-CM | POA: Insufficient documentation

## 2020-11-30 DIAGNOSIS — C7802 Secondary malignant neoplasm of left lung: Secondary | ICD-10-CM | POA: Diagnosis present

## 2020-11-30 MED ORDER — IOHEXOL 300 MG/ML  SOLN
100.0000 mL | Freq: Once | INTRAMUSCULAR | Status: AC | PRN
  Administered 2020-11-30: 100 mL via INTRAVENOUS

## 2020-12-01 ENCOUNTER — Inpatient Hospital Stay: Payer: Medicare Other | Attending: Hematology and Oncology | Admitting: Hematology and Oncology

## 2020-12-01 ENCOUNTER — Other Ambulatory Visit: Payer: Self-pay

## 2020-12-01 ENCOUNTER — Encounter: Payer: Self-pay | Admitting: Hematology and Oncology

## 2020-12-01 DIAGNOSIS — Z79899 Other long term (current) drug therapy: Secondary | ICD-10-CM | POA: Insufficient documentation

## 2020-12-01 DIAGNOSIS — C7801 Secondary malignant neoplasm of right lung: Secondary | ICD-10-CM

## 2020-12-01 DIAGNOSIS — R5383 Other fatigue: Secondary | ICD-10-CM

## 2020-12-01 DIAGNOSIS — R21 Rash and other nonspecific skin eruption: Secondary | ICD-10-CM | POA: Insufficient documentation

## 2020-12-01 DIAGNOSIS — N289 Disorder of kidney and ureter, unspecified: Secondary | ICD-10-CM | POA: Diagnosis not present

## 2020-12-01 DIAGNOSIS — R197 Diarrhea, unspecified: Secondary | ICD-10-CM | POA: Insufficient documentation

## 2020-12-01 DIAGNOSIS — R634 Abnormal weight loss: Secondary | ICD-10-CM | POA: Insufficient documentation

## 2020-12-01 DIAGNOSIS — C778 Secondary and unspecified malignant neoplasm of lymph nodes of multiple regions: Secondary | ICD-10-CM

## 2020-12-01 DIAGNOSIS — K529 Noninfective gastroenteritis and colitis, unspecified: Secondary | ICD-10-CM

## 2020-12-01 DIAGNOSIS — C55 Malignant neoplasm of uterus, part unspecified: Secondary | ICD-10-CM | POA: Diagnosis not present

## 2020-12-01 DIAGNOSIS — I10 Essential (primary) hypertension: Secondary | ICD-10-CM | POA: Insufficient documentation

## 2020-12-01 DIAGNOSIS — C7800 Secondary malignant neoplasm of unspecified lung: Secondary | ICD-10-CM | POA: Insufficient documentation

## 2020-12-01 DIAGNOSIS — C7802 Secondary malignant neoplasm of left lung: Secondary | ICD-10-CM

## 2020-12-01 DIAGNOSIS — Z5112 Encounter for antineoplastic immunotherapy: Secondary | ICD-10-CM | POA: Insufficient documentation

## 2020-12-01 NOTE — Assessment & Plan Note (Signed)
Kidney lesions are stable Observe with serial imaging study

## 2020-12-01 NOTE — Assessment & Plan Note (Signed)
It is probably unrelated to her treatment Observe closely for now

## 2020-12-01 NOTE — Assessment & Plan Note (Signed)
Her blood pressure is better controlled We discussed the importance of aggressive risk factor modification

## 2020-12-01 NOTE — Assessment & Plan Note (Signed)
This is improving Observe only for

## 2020-12-01 NOTE — Progress Notes (Signed)
[place] OFFICE PROGRESS NOTE  Patient Care Team: [name], DO as PCP - General (Family Medicine)  ASSESSMENT & PLAN:  Uterine cancer [facility]) I have reviewed multiple imaging studies with the patient and her sister The areas of abnormalities seen on her chest CT is not accurate in comparison with an old CT scan from April 2021 Overall, the resolution of the lung nodules and improvement of the size of the lymphadenopathy in her abdomen indicated for positive response to therapy For now, I recommend we continue treatment the same I plan to repeat imaging study again around April The patient would like to switch her infusion treatment from every 3 weeks to every 6 weeks I will get new insurance prior authorization She will continue Lenvima as prescribed   Other fatigue Her chronic fatigue is due to severe deconditioning I recommend aggressive PT and rehab Due to distance of travel, she will pursue physical therapy at home Per patient request, I have completed application for disability parking placard  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   All questions were answered. The patient knows to call the clinic with any problems, questions or concerns. The total time spent in the appointment was 40 minutes encounter with patients including review of chart and various tests results,  discussions about plan of care and coordination of care plan   Heath Lark, MD 12/01/2020 3:13 PM  INTERVAL HISTORY: Please see below for problem oriented charting. 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   01/18/2018 Imaging   CT scan of the chest, abdomen and pelvis elsewhere show lung nodules, kidney lesion as well as 1.5 x 1 cm left, iliac lymph node   01/23/2018 - 06/12/2018 Chemotherapy   The patient had adjuvant chemotherapy with carboplatin and taxol elsewhere for chemotherapy treatment.     06/25/2018 Imaging   CT scan of the chest, abdomen and pelvis showed persistent lung nodules, kidney lesion but reduced size of previously noted pelvic lymphadenopathy to 6 x 4 mm   07/09/2018 - 08/17/2018 Radiation Therapy   She  had daily radiation treatment for 5 weeks elsewhere in [place], 28 days treatment, 5040 cGy   09/25/2018 Imaging   CT scan of the chest, abdomen and pelvis shows stable lung nodule and kidney lesion.  Previously noted enlarged lymphadenopathy in the external iliac region has  resolved   01/16/2019 Imaging   CT imaging of the chest, abdomen and pelvis elsewhere showed stable kidney lesions but no evidence of cancer recurrence   05/16/2019 Imaging   Outside CT scan showed stable pulmonary nodules.  Bilateral simple renal cyst with an exophytic lesion in the medial cortex of the lower pole of the right kidney measure 10 mm with enhancement postcontrast.  There is 6 mm lymph node in the left para-aortic region.  There is an enlarging hyper enhancing aortocaval lymph node measuring 5 mm.   08/27/2019 PET scan   There is left supraclavicular lymph node measure 7 mm, another additional supraclavicular region 9 mm with SUV of 8.3.  There is abnormal uptake in retrocrural lymph nodes, SUV 3.7.  There is elevated FDG uptake in the patient's known retroperitoneal lymph nodes.  There is a small solid-appearing right inferior medial renal lesion with no FDG uptake.   09/13/2019 Procedure   She underwent biopsy of 2 left supraclavicular lymph node elsewhere   09/13/2019 Pathology Results   Immunohistochemical stains performed on the biopsy report showed that the tumor cells are strongly and diffusely positive for p16, PAX 8, p53, CK7, pancytokeratin and beta-catenin.  CA-125, CK20 and TTF-1 are negative.  Ki-67 stains approximately 50% of tumor cells.  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   10/09/2019 -  Chemotherapy   The patient had docetaxel and carboplatin for chemotherapy treatment elsewhere in [place].  Her treatment was initially given weekly.  Cycle 1 was complicated by neutropenia.  Cycle 2 was dose adjusted and delay.  Cycle 2 treatment was complicated by loss of appetite, severe diarrhea and persistent neutropenia.  Cycle 3 was delay as well.  She went to the emergency department twice for IV fluid hydration and have lost a lot of weight.    12/18/2019 Imaging   CT chest, abdomen and pelvis 1.  Interval resolution of the left supraclavicular, retroperitoneal, and right retrocrural lymphadenopathy seen on previous diagnostic CT and PET-CT. Tiny lymph nodes are seen in the retroperitoneal region today. 2. Tiny bilateral pulmonary nodules, indeterminate. These do not appear substantially changed although we do not have a prior diagnostic CT chest to compare. Previous PET-CT is a free breathing exam which obscure small pulmonary nodules. Attention on follow-up recommended. 3. Bilateral interstitial and patchy peripheral ground-glass opacity in both lungs, nonspecific but potentially related to infectious/inflammatory process or scarring. 4. 10 mm enhancing lesion lower pole right kidney, suspicious for neoplasm. MRI without and with contrast recommended to further evaluate. 5. Small hiatal hernia. 6. Enlargement of the pulmonary outflow tract and main pulmonary arteries compatible with pulmonary arterial hypertension.   03/09/2020 Imaging   1. No findings suspicious for metastatic disease in the chest, abdomen or pelvis. 2. Small pulmonary nodules are all stable and warrant continued chest CT follow-up. 3. Stable indeterminate exophytic 1.2 cm renal cortical lesion in the lateral lower left kidney, renal cell carcinoma not excluded. Dedicated MRI abdomen without and with IV contrast may be obtained for further evaluation as clinically warranted. 4. Chronic findings include: Stable dilated main pulmonary artery, suggesting pulmonary arterial hypertension. Small hiatal hernia. Mild left colonic diverticulosis. Two-vessel coronary  atherosclerosis. Aortic Atherosclerosis (ICD10-I70.0).     06/04/2020 Imaging   1. Interval progression of small retroperitoneal lymph nodes. While not technically enlarged by CT criteria, the interval progression is concerning. The enlargement of these nodes is more apparent when comparing back to the older study from 12/18/2019 and they are now similar in size to the  abnormal lymph nodes seen on the outside imaging of 12/03/2019. Close follow-up recommended. 2. Bilateral pulmonary nodules, similar to prior. The 6 mm right lower lobe nodule appears more confluent than on the prior study and is slightly increased from 5 mm when I remeasure on the previous exam. Close attention on follow-up recommended. 3. Small hiatal hernia. 4. Left colonic diverticulosis without diverticulitis. 5. Aortic Atherosclerosis (ICD10-I70.0).   08/31/2020 Imaging   1. Today's study is again concerning for progressive metastatic disease both with increased number and size of numerous small pulmonary nodules in the visualize lung bases, as well as increased number and size of numerous retroperitoneal lymph nodes, as detailed above. 2. Multiple cystic lesions in the kidneys bilaterally, favored to represent a combination of Bosniak class 1 and Bosniak class 2 cysts, as detailed above. 3. Colonic diverticulosis without evidence of acute diverticulitis at this time. 4. Cardiomegaly with left atrial dilatation. 5. Small hiatal hernia. 6. Aortic atherosclerosis.     09/15/2020 -  Chemotherapy   The patient had lenvatinib 20 mg daily dose (LENVIMA) 2 x 10 MG capsule, 10 mg (50 % of original dose 20 mg), Oral, Daily, 1 of 1 cycle, Start date: 09/02/2020, End date: 09/02/2020 Dose modification: 10 mg (original dose 20 mg, Cycle 0) pembrolizumab (KEYTRUDA) 200 mg in sodium chloride 0.9 % 50 mL chemo infusion, 200 mg, Intravenous, Once, 4 of 6 cycles Dose modification: 400 mg (original dose 200 mg, Cycle 6, Reason: Provider Judgment) Administration: 200 mg (09/15/2020), 200 mg (10/27/2020), 200 mg (11/16/2020), 200 mg (10/06/2020)  for chemotherapy treatment.    11/30/2020 Imaging   1. Improving pulmonary metastatic disease. 2. Left hilar lymph nodes have increased in size slightly in the interval. Subcentimeter abdominal retroperitoneal lymph nodes are stable. 3. Hyperattenuating lesions in the  inferior aspects of both kidneys are too small to characterize. Further evaluation with pre and post contrast MRI should be considered. Pre and post contrast CT could alternatively be performed, but would likely be of decreased accuracy given lesion size. 4. Hepatic steatosis. 5. Aortic atherosclerosis (ICD10-I70.0). Coronary artery calcification. 6. Enlarged pulmonic trunk, indicative of pulmonary arterial hypertension.       REVIEW OF SYSTEMS:   Constitutional: Denies fevers, chills or abnormal weight loss Eyes: Denies blurriness of vision Ears, nose, mouth, throat, and face: Denies mucositis or sore throat Respiratory: Denies cough, dyspnea or wheezes Cardiovascular: Denies palpitation, chest discomfort or lower extremity swelling GLymphatics: Denies new lymphadenopathy or easy bruising Behavioral/Psych: Mood is stable, no new changes  All other systems were reviewed with the patient and are negative.  I have reviewed the past medical history, past surgical history, social history and family history with the patient and they are unchanged from previous note.  ALLERGIES:  is allergic to lisinopril, verapamil, and other.  MEDICATIONS:  Current Outpatient Medications  Medication Sig Dispense Refill  . amLODipine (NORVASC) 10 MG tablet Take 1 tablet (10 mg total) by mouth daily. 90 tablet 3  . aspirin EC 81 MG tablet Take 81 mg by mouth daily.    . clonazePAM (KLONOPIN) 0.5 MG tablet Take 1 tablet (0.5 mg total) by mouth at bedtime.  30 tablet 4  . glipiZIDE (GLUCOTROL) 5 MG tablet Take 0.5 tablets by mouth 2 (two) times daily.    . hydrALAZINE (APRESOLINE) 25 MG tablet Take 1 tablet (25 mg total) by mouth 3 (three) times daily. 270 tablet 3  . hydrochlorothiazide (HYDRODIURIL) 25 MG tablet Take 1 tablet (25 mg total) by mouth daily. 90 tablet 3  . HYDROcodone-acetaminophen (NORCO/VICODIN) 5-325 MG tablet Take 1 tablet by mouth every 6 (six) hours as needed. 60 tablet 0  . lenvatinib 10  mg daily dose (LENVIMA, 10 MG DAILY DOSE,) capsule Take 1 capsule (10 mg total) by mouth daily. 30 capsule 11  . lidocaine-prilocaine (EMLA) cream Apply 1 application topically as needed.    . lidocaine-prilocaine (EMLA) cream Apply to affected area once 30 g 3  . losartan (COZAAR) 100 MG tablet Take 100 mg by mouth daily.    . metoprolol succinate (TOPROL-XL) 100 MG 24 hr tablet Take 100 mg by mouth daily. Take with or immediately following a meal.    . ondansetron (ZOFRAN) 8 MG tablet Take 1 tablet (8 mg total) by mouth every 8 (eight) hours as needed (Nausea or vomiting). 30 tablet 1  . prochlorperazine (COMPAZINE) 10 MG tablet Take 1 tablet (10 mg total) by mouth every 6 (six) hours as needed (Nausea or vomiting). 30 tablet 1  . rosuvastatin (CRESTOR) 10 MG tablet Take 1 tablet (10 mg total) by mouth daily. 90 tablet 2  . terazosin (HYTRIN) 2 MG capsule Take 1 capsule (2 mg total) by mouth at bedtime. 30 capsule 1   No current facility-administered medications for this visit.    PHYSICAL EXAMINATION: ECOG PERFORMANCE STATUS: 1 - Symptomatic but completely ambulatory  Vitals:   12/01/20 1147  BP: 120/69  Pulse: (!) 59  Resp: 18  Temp: 98.1 F (36.7 C)  SpO2: 98%   Filed Weights   12/01/20 1147  Weight: 153 lb (69.4 kg)    GENERAL:alert, no distress and comfortable SKIN: Noted some papular macular skin lesion on her hands and legs, not consistent with drug induced rash NEURO: alert & oriented x 3 with fluent speech, no focal motor/sensory deficits  LABORATORY DATA:  I have reviewed the data as listed    Component Value Date/Time   NA 139 11/16/2020 1230   K 3.6 11/16/2020 1230   CL 102 11/16/2020 1230   CO2 27 11/16/2020 1230   GLUCOSE 187 (H) 11/16/2020 1230   BUN 23 11/16/2020 1230   CREATININE 1.16 (H) 11/16/2020 1230   CALCIUM 10.3 11/16/2020 1230   PROT 6.8 11/16/2020 1230   ALBUMIN 3.8 11/16/2020 1230   AST 20 11/16/2020 1230   ALT 22 11/16/2020 1230    ALKPHOS 58 11/16/2020 1230   BILITOT 0.8 11/16/2020 1230   GFRNONAA 50 (L) 11/16/2020 1230   GFRAA >60 08/31/2020 1022    No results found for: SPEP, UPEP  Lab Results  Component Value Date   WBC 5.1 11/16/2020   NEUTROABS 3.1 11/16/2020   HGB 13.6 11/16/2020   HCT 38.6 11/16/2020   MCV 88.9 11/16/2020   PLT 197 11/16/2020      Chemistry      Component Value Date/Time   NA 139 11/16/2020 1230   K 3.6 11/16/2020 1230   CL 102 11/16/2020 1230   CO2 27 11/16/2020 1230   BUN 23 11/16/2020 1230   CREATININE 1.16 (H) 11/16/2020 1230      Component Value Date/Time   CALCIUM 10.3 11/16/2020 1230   ALKPHOS  58 11/16/2020 1230   AST 20 11/16/2020 1230   ALT 22 11/16/2020 1230   BILITOT 0.8 11/16/2020 1230       RADIOGRAPHIC STUDIES: I have reviewed multiple imaging studies with the patient and sister I have personally reviewed the radiological images as listed and agreed with the findings in the report. CT CHEST ABDOMEN PELVIS W CONTRAST  Result Date: 11/30/2020 CLINICAL DATA:  Restaging uterine cancer. Chemotherapy and radiation therapy complete. Ongoing immunotherapy. EXAM: CT CHEST, ABDOMEN, AND PELVIS WITH CONTRAST TECHNIQUE: Multidetector CT imaging of the chest, abdomen and pelvis was performed following the standard protocol during bolus administration of intravenous contrast. CONTRAST:  174mL OMNIPAQUE IOHEXOL 300 MG/ML  SOLN COMPARISON:  CT abdomen pelvis 08/31/2020 and CT chest 03/09/2020. FINDINGS: CT CHEST FINDINGS Cardiovascular: Right IJ Port-A-Cath terminates in the low SVC. Atherosclerotic calcification of the aorta and coronary arteries. Pulmonic trunk and heart are enlarged. No pericardial effusion. Mediastinum/Nodes: Low right paratracheal lymph node measures 7 mm (2/25), stable. Left hilar lymph nodes measure up to 1.3 cm (2/26), increased from 8 mm. No axillary adenopathy. Esophagus is grossly unremarkable. Lungs/Pleura: Some pulmonary nodules have resolved in the  interval. For example, a previously seen 8 mm lateral right lower lobe nodule (6/111) has resolved when compared with 08/31/2020. Other small pulmonary nodules are stable, measuring up to 6 mm in the lateral right lower lobe (6/114). No pleural fluid. Airway is unremarkable. Musculoskeletal: Postoperative changes in the proximal right humerus. Two faint areas of sclerosis in the proximal humerus (6/10) are unchanged. Mild compression of the T2 vertebral body is unchanged. CT ABDOMEN PELVIS FINDINGS Hepatobiliary: Liver is decreased in attenuation diffusely. Liver is otherwise unremarkable. Cholecystectomy. No biliary ductal dilatation. Pancreas: Negative. Spleen: Negative. Adrenals/Urinary Tract: Adrenal glands are unremarkable. Low-attenuation lesions in the kidneys measure up to 2.8 cm on the right and are indicative of cysts. A 10 mm hyperdense lesion off the lower pole right kidney (2/75) and a 1.3 cm hyperdense lesion off the lower pole left kidney appear unchanged but difficult to further characterize due to size and lack of precontrast imaging. Ureters are decompressed. Small amount of air in the bladder is presumably iatrogenic. Stomach/Bowel: Small hiatal hernia. Stomach, small bowel, appendix and colon are otherwise unremarkable. Vascular/Lymphatic: Atherosclerotic calcification of the aorta without aneurysm. No pathologically enlarged lymph nodes. Specifically, abdominal retroperitoneal lymph nodes are subcentimeter in size, as before. Reproductive: Hysterectomy.  No adnexal mass. Other: No free fluid.  Mesenteries and peritoneum are unremarkable. Musculoskeletal: Degenerative changes in the spine. No worrisome lytic or sclerotic lesions. Degenerative changes in the hips. Mild levoconvex scoliosis of the lumbar spine. IMPRESSION: 1. Improving pulmonary metastatic disease. 2. Left hilar lymph nodes have increased in size slightly in the interval. Subcentimeter abdominal retroperitoneal lymph nodes are  stable. 3. Hyperattenuating lesions in the inferior aspects of both kidneys are too small to characterize. Further evaluation with pre and post contrast MRI should be considered. Pre and post contrast CT could alternatively be performed, but would likely be of decreased accuracy given lesion size. 4. Hepatic steatosis. 5. Aortic atherosclerosis (ICD10-I70.0). Coronary artery calcification. 6. Enlarged pulmonic trunk, indicative of pulmonary arterial hypertension. Electronically Signed   By: Lorin Picket M.D.   On: 11/30/2020 11:21

## 2020-12-01 NOTE — Assessment & Plan Note (Signed)
Her chronic fatigue is due to severe deconditioning I recommend aggressive PT and rehab Due to distance of travel, she will pursue physical therapy at home Per patient request, I have completed application for disability parking placard

## 2020-12-01 NOTE — Assessment & Plan Note (Signed)
This is unrelated to her treatment I recommend scheduled Imodium

## 2020-12-01 NOTE — Assessment & Plan Note (Signed)
CT imaging show improvement Observe with serial imaging study

## 2020-12-01 NOTE — Assessment & Plan Note (Signed)
I have reviewed multiple imaging studies with the patient and her sister The areas of abnormalities seen on her chest CT is not accurate in comparison with an old CT scan from April 2021 Overall, the resolution of the lung nodules and improvement of the size of the lymphadenopathy in her abdomen indicated for positive response to therapy For now, I recommend we continue treatment the same I plan to repeat imaging study again around April The patient would like to switch her infusion treatment from every 3 weeks to every 6 weeks I will get new insurance prior authorization She will continue Lenvima as prescribed

## 2020-12-08 ENCOUNTER — Telehealth: Payer: Self-pay

## 2020-12-08 NOTE — Telephone Encounter (Signed)
She called and left a message to call her.  Called back. She is scheduled for crown repair on 1/20. She is asking if it is okay to have crown repair?

## 2020-12-08 NOTE — Telephone Encounter (Signed)
Called and given below message. She verbalized understanding. 

## 2020-12-08 NOTE — Telephone Encounter (Signed)
No need to stop chemo for crown repair

## 2020-12-11 ENCOUNTER — Other Ambulatory Visit: Payer: Self-pay | Admitting: Hematology and Oncology

## 2020-12-11 ENCOUNTER — Inpatient Hospital Stay: Payer: Medicare Other

## 2020-12-11 ENCOUNTER — Other Ambulatory Visit: Payer: Self-pay

## 2020-12-11 ENCOUNTER — Telehealth: Payer: Self-pay

## 2020-12-11 VITALS — BP 113/59 | HR 51 | Temp 97.8°F | Resp 16 | Wt 151.5 lb

## 2020-12-11 DIAGNOSIS — I10 Essential (primary) hypertension: Secondary | ICD-10-CM | POA: Diagnosis not present

## 2020-12-11 DIAGNOSIS — C55 Malignant neoplasm of uterus, part unspecified: Secondary | ICD-10-CM | POA: Diagnosis present

## 2020-12-11 DIAGNOSIS — E039 Hypothyroidism, unspecified: Secondary | ICD-10-CM

## 2020-12-11 DIAGNOSIS — C7800 Secondary malignant neoplasm of unspecified lung: Secondary | ICD-10-CM | POA: Diagnosis not present

## 2020-12-11 DIAGNOSIS — R21 Rash and other nonspecific skin eruption: Secondary | ICD-10-CM | POA: Diagnosis not present

## 2020-12-11 DIAGNOSIS — Z79899 Other long term (current) drug therapy: Secondary | ICD-10-CM | POA: Diagnosis not present

## 2020-12-11 DIAGNOSIS — R197 Diarrhea, unspecified: Secondary | ICD-10-CM | POA: Diagnosis not present

## 2020-12-11 DIAGNOSIS — Z95828 Presence of other vascular implants and grafts: Secondary | ICD-10-CM

## 2020-12-11 DIAGNOSIS — R634 Abnormal weight loss: Secondary | ICD-10-CM | POA: Diagnosis not present

## 2020-12-11 DIAGNOSIS — Z5112 Encounter for antineoplastic immunotherapy: Secondary | ICD-10-CM | POA: Diagnosis not present

## 2020-12-11 LAB — CBC WITH DIFFERENTIAL (CANCER CENTER ONLY)
Abs Immature Granulocytes: 0.02 10*3/uL (ref 0.00–0.07)
Basophils Absolute: 0 10*3/uL (ref 0.0–0.1)
Basophils Relative: 1 %
Eosinophils Absolute: 0.1 10*3/uL (ref 0.0–0.5)
Eosinophils Relative: 1 %
HCT: 38.9 % (ref 36.0–46.0)
Hemoglobin: 13.2 g/dL (ref 12.0–15.0)
Immature Granulocytes: 1 %
Lymphocytes Relative: 28 %
Lymphs Abs: 1 10*3/uL (ref 0.7–4.0)
MCH: 31.6 pg (ref 26.0–34.0)
MCHC: 33.9 g/dL (ref 30.0–36.0)
MCV: 93.1 fL (ref 80.0–100.0)
Monocytes Absolute: 0.4 10*3/uL (ref 0.1–1.0)
Monocytes Relative: 11 %
Neutro Abs: 2.2 10*3/uL (ref 1.7–7.7)
Neutrophils Relative %: 58 %
Platelet Count: 167 10*3/uL (ref 150–400)
RBC: 4.18 MIL/uL (ref 3.87–5.11)
RDW: 13.8 % (ref 11.5–15.5)
WBC Count: 3.7 10*3/uL — ABNORMAL LOW (ref 4.0–10.5)
nRBC: 0 % (ref 0.0–0.2)

## 2020-12-11 LAB — CMP (CANCER CENTER ONLY)
ALT: 14 U/L (ref 0–44)
AST: 20 U/L (ref 15–41)
Albumin: 3.8 g/dL (ref 3.5–5.0)
Alkaline Phosphatase: 48 U/L (ref 38–126)
Anion gap: 8 (ref 5–15)
BUN: 20 mg/dL (ref 8–23)
CO2: 26 mmol/L (ref 22–32)
Calcium: 10.2 mg/dL (ref 8.9–10.3)
Chloride: 105 mmol/L (ref 98–111)
Creatinine: 1 mg/dL (ref 0.44–1.00)
GFR, Estimated: >60 mL/min (ref >60)
Glucose, Bld: 132 mg/dL — ABNORMAL HIGH (ref 70–99)
Potassium: 3.4 mmol/L — ABNORMAL LOW (ref 3.5–5.1)
Sodium: 139 mmol/L (ref 135–145)
Total Bilirubin: 1.1 mg/dL (ref 0.3–1.2)
Total Protein: 6.8 g/dL (ref 6.5–8.1)

## 2020-12-11 LAB — TSH: TSH: 12.104 u[IU]/mL — ABNORMAL HIGH (ref 0.308–3.960)

## 2020-12-11 LAB — TOTAL PROTEIN, URINE DIPSTICK: Protein, ur: 30 mg/dL — AB

## 2020-12-11 MED ORDER — PEMBROLIZUMAB CHEMO INJECTION 100 MG/4ML
400.0000 mg | Freq: Once | INTRAVENOUS | Status: AC
  Administered 2020-12-11: 400 mg via INTRAVENOUS
  Filled 2020-12-11: qty 16

## 2020-12-11 MED ORDER — HEPARIN SOD (PORK) LOCK FLUSH 100 UNIT/ML IV SOLN
500.0000 [IU] | Freq: Once | INTRAVENOUS | Status: AC | PRN
  Administered 2020-12-11: 500 [IU]
  Filled 2020-12-11: qty 5

## 2020-12-11 MED ORDER — SODIUM CHLORIDE 0.9% FLUSH
10.0000 mL | INTRAVENOUS | Status: DC | PRN
  Administered 2020-12-11: 10 mL
  Filled 2020-12-11: qty 10

## 2020-12-11 MED ORDER — LEVOTHYROXINE SODIUM 25 MCG PO TABS: 25.0000 ug | ORAL_TABLET | Freq: Every day | ORAL | 11 refills | Status: DC

## 2020-12-11 MED ORDER — SODIUM CHLORIDE 0.9 % IV SOLN
Freq: Once | INTRAVENOUS | Status: AC
  Filled 2020-12-11: qty 250

## 2020-12-11 MED FILL — LEVOTHYROXINE 25 MCG TABLET: 25 | 30 days supply | Qty: 30 | Fill #0

## 2020-12-11 NOTE — Patient Instructions (Signed)
[facility] Discharge Instructions for Patients Receiving Chemotherapy  Today you received the following chemotherapy agents: pembrolizumab.  To help prevent nausea and vomiting after your treatment, we encourage you to take your nausea medication as directed.   If you develop nausea and vomiting that is not controlled by your nausea medication, call the clinic.   BELOW ARE SYMPTOMS THAT SHOULD BE REPORTED IMMEDIATELY:  *FEVER GREATER THAN 100.5 F  *CHILLS WITH OR WITHOUT FEVER  NAUSEA AND VOMITING THAT IS NOT CONTROLLED WITH YOUR NAUSEA MEDICATION  *UNUSUAL SHORTNESS OF BREATH  *UNUSUAL BRUISING OR BLEEDING  TENDERNESS IN MOUTH AND THROAT WITH OR WITHOUT PRESENCE OF ULCERS  *URINARY PROBLEMS  *BOWEL PROBLEMS  UNUSUAL RASH Items with * indicate a potential emergency and should be followed up as soon as possible.  Feel free to call the clinic should you have any questions or concerns. The clinic phone number is [phone].  Please show the CHEMO ALERT CARD at check-in to the Emergency Department and triage nurse.   

## 2020-12-11 NOTE — Patient Instructions (Signed)

## 2020-12-11 NOTE — Telephone Encounter (Signed)
Patient is approved for Lenvima at no charge through [facility] 12/11/20-11/27/21.  [place] uses [facility] [place] Phone [phone] Fax [phone] 12/11/2020 3:21 PM

## 2020-12-11 NOTE — Telephone Encounter (Signed)
Called per Dr. Alvy Bimler, she sent Synthroid Rx to [facility] outpatient pharmacy. Take it 30 mins before meal in the morning. [place] verbalized understanding.

## 2020-12-12 LAB — T4: T4, Total: 6.9 ug/dL (ref 4.5–12.0)

## 2020-12-22 ENCOUNTER — Encounter (HOSPITAL_COMMUNITY): Payer: Self-pay

## 2020-12-22 ENCOUNTER — Telehealth: Payer: Self-pay

## 2020-12-22 ENCOUNTER — Emergency Department (HOSPITAL_COMMUNITY): Payer: Medicare Other

## 2020-12-22 ENCOUNTER — Inpatient Hospital Stay (HOSPITAL_COMMUNITY)
Admission: EM | Admit: 2020-12-22 | Discharge: 2020-12-25 | DRG: 054 | Disposition: A | Discharge status: Home / Self Care | Payer: Medicare Other | Attending: Internal Medicine | Admitting: Internal Medicine

## 2020-12-22 DIAGNOSIS — R12 Heartburn: Secondary | ICD-10-CM

## 2020-12-22 DIAGNOSIS — Z9109 Other allergy status, other than to drugs and biological substances: Secondary | ICD-10-CM

## 2020-12-22 DIAGNOSIS — R42 Dizziness and giddiness: Secondary | ICD-10-CM | POA: Diagnosis not present

## 2020-12-22 DIAGNOSIS — Z888 Allergy status to other drugs, medicaments and biological substances status: Secondary | ICD-10-CM

## 2020-12-22 DIAGNOSIS — G936 Cerebral edema: Secondary | ICD-10-CM | POA: Diagnosis present

## 2020-12-22 DIAGNOSIS — C7931 Secondary malignant neoplasm of brain: Secondary | ICD-10-CM | POA: Diagnosis not present

## 2020-12-22 DIAGNOSIS — R4781 Slurred speech: Secondary | ICD-10-CM | POA: Diagnosis present

## 2020-12-22 DIAGNOSIS — R531 Weakness: Secondary | ICD-10-CM | POA: Diagnosis present

## 2020-12-22 DIAGNOSIS — C55 Malignant neoplasm of uterus, part unspecified: Secondary | ICD-10-CM | POA: Diagnosis present

## 2020-12-22 DIAGNOSIS — E213 Hyperparathyroidism, unspecified: Secondary | ICD-10-CM | POA: Diagnosis present

## 2020-12-22 DIAGNOSIS — Z923 Personal history of irradiation: Secondary | ICD-10-CM

## 2020-12-22 DIAGNOSIS — T380X5A Adverse effect of glucocorticoids and synthetic analogues, initial encounter: Secondary | ICD-10-CM | POA: Diagnosis not present

## 2020-12-22 DIAGNOSIS — Z79899 Other long term (current) drug therapy: Secondary | ICD-10-CM

## 2020-12-22 DIAGNOSIS — I10 Essential (primary) hypertension: Secondary | ICD-10-CM | POA: Diagnosis present

## 2020-12-22 DIAGNOSIS — Z90710 Acquired absence of both cervix and uterus: Secondary | ICD-10-CM

## 2020-12-22 DIAGNOSIS — K219 Gastro-esophageal reflux disease without esophagitis: Secondary | ICD-10-CM | POA: Diagnosis present

## 2020-12-22 DIAGNOSIS — Z7982 Long term (current) use of aspirin: Secondary | ICD-10-CM

## 2020-12-22 DIAGNOSIS — Z7984 Long term (current) use of oral hypoglycemic drugs: Secondary | ICD-10-CM

## 2020-12-22 DIAGNOSIS — E039 Hypothyroidism, unspecified: Secondary | ICD-10-CM | POA: Diagnosis present

## 2020-12-22 DIAGNOSIS — E876 Hypokalemia: Secondary | ICD-10-CM | POA: Diagnosis present

## 2020-12-22 DIAGNOSIS — Z833 Family history of diabetes mellitus: Secondary | ICD-10-CM

## 2020-12-22 DIAGNOSIS — Z79890 Hormone replacement therapy (postmenopausal): Secondary | ICD-10-CM

## 2020-12-22 DIAGNOSIS — E119 Type 2 diabetes mellitus without complications: Secondary | ICD-10-CM | POA: Diagnosis present

## 2020-12-22 DIAGNOSIS — C541 Malignant neoplasm of endometrium: Secondary | ICD-10-CM | POA: Diagnosis present

## 2020-12-22 DIAGNOSIS — C7800 Secondary malignant neoplasm of unspecified lung: Secondary | ICD-10-CM | POA: Diagnosis present

## 2020-12-22 DIAGNOSIS — R29810 Facial weakness: Secondary | ICD-10-CM | POA: Diagnosis not present

## 2020-12-22 DIAGNOSIS — Z20822 Contact with and (suspected) exposure to covid-19: Secondary | ICD-10-CM | POA: Diagnosis present

## 2020-12-22 DIAGNOSIS — Z9221 Personal history of antineoplastic chemotherapy: Secondary | ICD-10-CM

## 2020-12-22 LAB — CBC WITH DIFFERENTIAL/PLATELET
Abs Immature Granulocytes: 0.03 10*3/uL (ref 0.00–0.07)
Basophils Absolute: 0 10*3/uL (ref 0.0–0.1)
Basophils Relative: 1 %
Eosinophils Absolute: 0 10*3/uL (ref 0.0–0.5)
Eosinophils Relative: 0 %
HCT: 44 % (ref 36.0–46.0)
Hemoglobin: 15.3 g/dL — ABNORMAL HIGH (ref 12.0–15.0)
Immature Granulocytes: 0 %
Lymphocytes Relative: 34 %
Lymphs Abs: 2.3 10*3/uL (ref 0.7–4.0)
MCH: 32 pg (ref 26.0–34.0)
MCHC: 34.8 g/dL (ref 30.0–36.0)
MCV: 92.1 fL (ref 80.0–100.0)
Monocytes Absolute: 0.6 10*3/uL (ref 0.1–1.0)
Monocytes Relative: 9 %
Neutro Abs: 3.7 10*3/uL (ref 1.7–7.7)
Neutrophils Relative %: 56 %
Platelets: 215 10*3/uL (ref 150–400)
RBC: 4.78 MIL/uL (ref 3.87–5.11)
RDW: 13.9 % (ref 11.5–15.5)
WBC: 6.7 10*3/uL (ref 4.0–10.5)
nRBC: 0 % (ref 0.0–0.2)

## 2020-12-22 LAB — COMPREHENSIVE METABOLIC PANEL
ALT: 18 U/L (ref 0–44)
AST: 35 U/L (ref 15–41)
Albumin: 4.7 g/dL (ref 3.5–5.0)
Alkaline Phosphatase: 48 U/L (ref 38–126)
Anion gap: 18 — ABNORMAL HIGH (ref 5–15)
BUN: 22 mg/dL (ref 8–23)
CO2: 18 mmol/L — ABNORMAL LOW (ref 22–32)
Calcium: 10.6 mg/dL — ABNORMAL HIGH (ref 8.9–10.3)
Chloride: 98 mmol/L (ref 98–111)
Creatinine, Ser: 1.18 mg/dL — ABNORMAL HIGH (ref 0.44–1.00)
GFR, Estimated: 49 mL/min — ABNORMAL LOW (ref >60)
Glucose, Bld: 131 mg/dL — ABNORMAL HIGH (ref 70–99)
Potassium: 4.3 mmol/L (ref 3.5–5.1)
Sodium: 134 mmol/L — ABNORMAL LOW (ref 135–145)
Total Bilirubin: 1.8 mg/dL — ABNORMAL HIGH (ref 0.3–1.2)
Total Protein: 7.7 g/dL (ref 6.5–8.1)

## 2020-12-22 LAB — SARS CORONAVIRUS 2 BY RT PCR (HOSPITAL ORDER, PERFORMED IN ~~LOC~~ HOSPITAL LAB): SARS Coronavirus 2: NEGATIVE

## 2020-12-22 LAB — TROPONIN I (HIGH SENSITIVITY)
Troponin I (High Sensitivity): 11 ng/L (ref <18)
Troponin I (High Sensitivity): 10 ng/L (ref <18)

## 2020-12-22 MED ORDER — DEXAMETHASONE 4 MG PO TABS
4.0000 mg | ORAL_TABLET | Freq: Four times a day (QID) | ORAL | Status: DC
Start: 2020-12-23 — End: 2020-12-25
  Administered 2020-12-23 – 2020-12-25 (×11): 4 mg via ORAL
  Filled 2020-12-22 (×11): qty 1

## 2020-12-22 MED ORDER — GADOBUTROL 1 MMOL/ML IV SOLN
7.0000 mL | Freq: Once | INTRAVENOUS | Status: AC | PRN
  Administered 2020-12-22: 7 mL via INTRAVENOUS

## 2020-12-22 MED ORDER — DEXAMETHASONE SODIUM PHOSPHATE 10 MG/ML IJ SOLN
10.0000 mg | Freq: Once | INTRAMUSCULAR | Status: AC
  Administered 2020-12-22: 10 mg via INTRAVENOUS
  Filled 2020-12-22: qty 1

## 2020-12-22 MED ORDER — DEXAMETHASONE 4 MG PO TABS: 4.0000 mg | ORAL_TABLET | Freq: Four times a day (QID) | ORAL | Status: DC

## 2020-12-22 MED ORDER — LEVETIRACETAM IN NACL 1000 MG/100ML IV SOLN
1000.0000 mg | Freq: Once | INTRAVENOUS | Status: AC
  Administered 2020-12-22: 1000 mg via INTRAVENOUS
  Filled 2020-12-22: qty 100

## 2020-12-22 MED ORDER — LEVETIRACETAM 500 MG PO TABS
500.0000 mg | ORAL_TABLET | Freq: Two times a day (BID) | ORAL | Status: DC
  Administered 2020-12-23 – 2020-12-25 (×5): 500 mg via ORAL
  Filled 2020-12-22 (×5): qty 1

## 2020-12-22 NOTE — Telephone Encounter (Signed)
I received a fax from an outside emergency department This patient was seen in the emergency room due to slurring of speech, dysarthria and confusion She is receiving chemotherapy for metastatic uterine cancer According to scan report, CT scanning of the brain was performed with contrast The report indicated that there is a large necrotic mass within the left frontoparietal vertex with nodular enhancement along the cranial aspect measuring 3.8 x 3.7 x 4.2 cm.  An additional smaller necrotic mass in the right frontal vertex measured 2.0 x 1.7 x 1.6 cm, associated with marked vasogenic edema in the left frontal and parietal region.  Both are concerning for metastatic disease.  The vasogenic edema appears to have caused localized mass-effect.  The patient left the emergency department The family is calling me for help about recommendation Given the severity of the report, and marked vasogenic edema with mass-effect, I believe the patient needs urgent evaluation and treatment I recommend the patient to present to [facility] emergency department for urgent MRI of the brain with and without contrast and admission to the hospital to expedite palliative radiation therapy She will likely need antiseizure medication to reduce risk of seizure

## 2020-12-22 NOTE — Telephone Encounter (Signed)
Janice and her husband called. Naw went to ER last night in [place], [place]. She had a scan and it showed x2 lesions on brain. She is getting her words mixed up. Requesting fax #, fax # given. [place] [facility] will fax report to office.

## 2020-12-22 NOTE — Telephone Encounter (Signed)
Called sister and given below message from Dr. Alvy Bimler. She verbalized understanding. She is driving to get her sister in [place] and will take her to the ER at [facility].

## 2020-12-22 NOTE — ED Provider Notes (Signed)
[facility] DEPT Provider Note   CSN: [id number] Arrival date & time: 12/22/20  1650     History No chief complaint on file.   Sandra Arroyo is a 70 y.o. female.  HPI   Patient presents to the ED for evaluation of metastatic brain lesions.  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  Patient has been seeing Dr. Alvy Bimler oncology.  Patient started having issues in the last few days with dizziness as well as blurred vision.  She was also having difficulties with her speech.  Patient lives in [place] and went to a medical facility there.  Patient sister who is helping to provide the history states that they do not have a full medical staff at the facility.  She ended up getting a CT scan of the brain that showed new metastatic lesions with cerebral edema.  Patient states she was discharged home and was instructed to follow-up with her oncologist.  Patient's was then instructed to come to the ED to be admitted to the hospital for further treatment.  While the patient was waiting in the ED she did experience some pain in her arm.  Family states she had clutched her chest but the patient states it was not exactly in her chest Past Medical History:  Diagnosis Date  . Cancer ([place]) 2019  . Diabetes mellitus without complication ([place])   . Hyperparathyroidism, unspecified ([place])   . Hypertension     Patient Active Problem List   Diagnosis Date Noted  . Acquired hypothyroidism 12/11/2020  . Chronic diarrhea 12/01/2020  . Skin rash 12/01/2020  . Metastasis to lung ([place]) 10/27/2020  . Type 2 diabetes mellitus with hyperglycemia, without long-term current use of insulin ([place]) 10/02/2020  . Hyperparathyroidism, unspecified ([place])   . Port-A-Cath in place 01/13/2020  . Pulmonary infiltrates 12/19/2019  . Kidney lesion, native, left 12/19/2019  . Goals of care, counseling/discussion 12/12/2019  . Uterine cancer ([place]) 12/11/2019   . 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 Other fatigue 12/11/2019  . Pancytopenia, acquired ([place]) 12/11/2019  . Family history of cancer 12/11/2019  . Diabetes mellitus without complication, without long-term current use of insulin ([place]) 12/11/2019  . Essential hypertension 12/11/2019  . 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 ABDOMINAL HYSTERECTOMY    . CESAREAN SECTION    . PARATHYROIDECTOMY    . TONSILLECTOMY       OB History   No obstetric history on file.     Family History  Problem Relation Age of Onset  . Cancer Mother 58       breast ca  . Cancer Father        testicular, kidney cancer, liver  . Stroke Father   . Diabetes Father   . Cancer Brother        testicular cancer    Social History   Tobacco Use  . Smoking status: Never Smoker  . Smokeless tobacco: Never Used  Substance Use Topics  . Alcohol use: Never    Home Medications Prior to Admission medications   Medication Sig Start Date End Date Taking? Authorizing Provider  amLODipine (NORVASC) 10 MG tablet Take 1 tablet (10 mg total) by mouth daily. 10/27/20  Yes Heath Lark, MD  aspirin EC 81 MG tablet Take 81 mg by mouth daily.   Yes [provider]  clonazePAM (KLONOPIN) 0.5 MG tablet Take  1 tablet (0.5 mg total) by mouth at bedtime. Patient taking differently: Take 0.5 mg by mouth at bedtime as needed (sleep). 09/15/20  Yes Gorsuch, Ni, MD  glipiZIDE (GLUCOTROL) 5 MG tablet Take 0.5 tablets by mouth 2 (two) times daily.   Yes [provider]  hydrALAZINE (APRESOLINE) 25 MG tablet Take 1 tablet (25 mg total) by mouth 3 (three) times daily. 09/28/20  Yes Gorsuch, Ni, MD  hydrochlorothiazide (HYDRODIURIL) 25 MG tablet Take 1 tablet (25 mg total) by mouth daily. 10/27/20  Yes Gorsuch, Ernst Spell, MD  HYDROcodone-acetaminophen (NORCO/VICODIN) 5-325 MG tablet Take 1 tablet by mouth every 6 (six) hours as  needed. Patient taking differently: Take 1 tablet by mouth every 6 (six) hours as needed for moderate pain. 11/16/20  Yes Gorsuch, Ni, MD  lenvatinib 10 mg daily dose (LENVIMA, 10 MG DAILY DOSE,) capsule Take 1 capsule (10 mg total) by mouth daily. 09/02/20  Yes Heath Lark, MD  levothyroxine (SYNTHROID) 25 MCG tablet Take 1 tablet (25 mcg total) by mouth daily before breakfast. 12/11/20  Yes Alvy Bimler, Ni, MD  lidocaine-prilocaine (EMLA) cream Apply to affected area once 09/01/20  Yes Gorsuch, Ni, MD  losartan (COZAAR) 100 MG tablet Take 100 mg by mouth daily.   Yes [provider]  metoprolol succinate (TOPROL-XL) 100 MG 24 hr tablet Take 100 mg by mouth daily. Take with or immediately following a meal.   Yes [provider]  ondansetron (ZOFRAN) 8 MG tablet Take 1 tablet (8 mg total) by mouth every 8 (eight) hours as needed (Nausea or vomiting). 09/01/20  Yes Heath Lark, MD  prochlorperazine (COMPAZINE) 10 MG tablet Take 1 tablet (10 mg total) by mouth every 6 (six) hours as needed (Nausea or vomiting). 09/01/20  Yes Gorsuch, Ni, MD  rosuvastatin (CRESTOR) 10 MG tablet Take 1 tablet (10 mg total) by mouth daily. 05/12/20  Yes [name], DO  terazosin (HYTRIN) 2 MG capsule Take 1 capsule (2 mg total) by mouth at bedtime. 01/13/20  Yes Heath Lark, MD    Allergies    Lisinopril, Verapamil, and Other  Review of Systems   Review of Systems  All other systems reviewed and are negative.   Physical Exam Updated Vital Signs BP (!) 139/105   Pulse 64   Temp 97.9 F (36.6 C) (Oral)   Resp 17   SpO2 99%   Physical Exam Vitals and nursing note reviewed.  Constitutional:      Appearance: She is well-developed and well-nourished. She is not toxic-appearing or diaphoretic.  HENT:     Head: Normocephalic and atraumatic.     Right Ear: External ear normal.     Left Ear: External ear normal.  Eyes:     General: No scleral icterus.       Right eye: No discharge.         Left eye: No discharge.     Conjunctiva/sclera: Conjunctivae normal.  Neck:     Trachea: No tracheal deviation.  Cardiovascular:     Rate and Rhythm: Normal rate and regular rhythm.     Pulses: Intact distal pulses.  Pulmonary:     Effort: Pulmonary effort is normal. No respiratory distress.     Breath sounds: Normal breath sounds. No stridor. No wheezing or rales.  Abdominal:     General: Bowel sounds are normal. There is no distension.     Palpations: Abdomen is soft.     Tenderness: There is no abdominal tenderness. There is no guarding or  rebound.  Musculoskeletal:        General: No tenderness or edema.     Cervical back: Neck supple.  Skin:    General: Skin is warm and dry.     Findings: No rash.  Neurological:     Mental Status: She is alert.     Cranial Nerves: No cranial nerve deficit (no facial droop, extraocular movements intact,aphasia).     Sensory: No sensory deficit.     Motor: No abnormal muscle tone or seizure activity.     Coordination: Coordination normal.     Deep Tendon Reflexes: Strength normal.     Comments: Patient having word finding difficulty  Psychiatric:        Mood and Affect: Mood and affect normal.     ED Results / Procedures / Treatments   Labs (all labs ordered are listed, but only abnormal results are displayed) Labs Reviewed  COMPREHENSIVE METABOLIC PANEL - Abnormal; Notable for the following components:      Result Value   Sodium 134 (*)    CO2 18 (*)    Glucose, Bld 131 (*)    Creatinine, Ser 1.18 (*)    Calcium 10.6 (*)    Total Bilirubin 1.8 (*)    GFR, Estimated 49 (*)    Anion gap 18 (*)    All other components within normal limits  CBC WITH DIFFERENTIAL/PLATELET - Abnormal; Notable for the following components:   Hemoglobin 15.3 (*)    All other components within normal limits  SARS CORONAVIRUS 2 BY RT PCR (HOSPITAL ORDER, [facility])  TROPONIN I (HIGH SENSITIVITY)  TROPONIN I (HIGH  SENSITIVITY)    EKG EKG Interpretation  Date/Time:  Tuesday December 22 2020 21:08:55 EST Ventricular Rate:  59 PR Interval:    QRS Duration: 94 QT Interval:  458 QTC Calculation: 454 R Axis:   10 Text Interpretation: Sinus rhythm Short PR interval Left ventricular hypertrophy Borderline T abnormalities, diffuse leads No old tracing to compare Confirmed by Dorie Rank [phone]) on 12/22/2020 9:37:15 PM   Radiology MR Brain W and Wo Contrast  Result Date: 12/22/2020 CLINICAL DATA:  Metastatic disease evaluation. Additional provided: Slurred speech, generalized weakness as of today. Additional history obtained from [place] NUMBERHistory of metastatic uterine cancer. EXAM: MRI HEAD WITHOUT AND WITH CONTRAST TECHNIQUE: Multiplanar, multiecho pulse sequences of the brain and surrounding structures were obtained without and with intravenous contrast. CONTRAST:  30mL GADAVIST GADOBUTROL 1 MMOL/ML IV SOLN COMPARISON:  No pertinent prior exams available for comparison. FINDINGS: Brain: Mild intermittent motion degradation, limiting evaluation. This includes mild motion degradation of the T1 weighted postcontrast sequences. Cerebral volume is normal for age. There are multiple enhancing parental lesions likely reflecting intracranial metastatic disease, as follows. Within the left frontoparietal lobes and extending to the left temporoparietal junction, there is a centrally necrotic mass with peripheral curvilinear and nodular enhancement measuring 4.2 x 3.8 x 3.6 cm (series 15, image 39). Moderate surrounding vasogenic edema. Notably, this mass involves the left pre and postcentral gyri. Associated mass effect results in partial effacement of the atrium of the left lateral ventricle. More anteriorly within the left frontal lobe, there is a 1.4 x 1.6 x 1.6 cm lesion within the left superior frontal, and possibly middle frontal, gyri. Precontrast T1 hyperintensity limits evaluation for enhancement.  Additionally, there is SWI signal loss associated with this lesion and findings likely reflect nonacute hemorrhage. Mild surrounding vasogenic edema. There is a punctate satellite lesion  along its anterolateral aspect (series 15, image 45). 5 mm enhancing lesion within the cortex of the mid right frontal lobe (series 15, image 42). No significant surrounding edema. Some restricted diffusion is associated with the metastatic lesions. However, there is no evidence of acute infarction. Background mild multifocal T2/FLAIR hyperintensity within the cerebral white matter is nonspecific, but compatible with chronic small vessel ischemic disease. There are a few punctate foci of SWI signal loss within the bilateral cerebral hemispheres compatible with nonspecific chronic microhemorrhages. No extra-axial fluid collection. No midline shift. Vascular: Expected proximal arterial flow voids. Skull and upper cervical spine: No focal marrow lesion Sinuses/Orbits: Visualized orbits show no acute finding. Mild bilateral ethmoid and maxillary sinus mucosal thickening. Other: Nonspecific 1.8 x 1.9 cm right parietal scalp lesion (series 8, image 25) (series 14, image 11). IMPRESSION: The examination is motion degraded and this may obscure small parenchymal lesions. Four parenchymal lesions are identified within the bilateral cerebral hemispheres, as described and likely reflecting intracranial metastatic disease. The largest lesion measures 4.2 x 3.8 x 3.6 cm and is located within the left frontoparietal lobes with extension to the left temporoparietal junction. Moderate surrounding vasogenic edema. Regional mass effect with partial effacement of the left lateral ventricle atrium. Notably, this dominant lesion involves the left pre and postcentral gyri. 1.8 x 1.9 cm nonspecific right parietal scalp lesion. Direct visualization is recommended. Mild cerebral white matter chronic small vessel ischemic disease. Mild paranasal sinus disease  as described. Electronically Signed   By: Kellie Simmering DO   On: 12/22/2020 21:51    Procedures Procedures   Medications Ordered in ED Medications  gadobutrol (GADAVIST) 1 MMOL/ML injection 7 mL (7 mLs Intravenous Contrast Given 12/22/20 2036)  dexamethasone (DECADRON) injection 10 mg (10 mg Intravenous Given 12/22/20 2202)  levETIRAcetam (KEPPRA) IVPB 1000 mg/100 mL premix (1,000 mg Intravenous New Bag/Given 12/22/20 2205)    ED Course  I have reviewed the triage vital signs and the nursing notes.  Pertinent labs & imaging results that were available during my care of the patient were reviewed by me and considered in my medical decision making (see chart for details).  Clinical Course as of 12/22/20 2301  Tue Dec 22, 2020  2137 Initial troponin normal.  Electrolyte panel notable for bicarb decreased at 18.  Bilirubin elevated 1.8.  CBC normal. [JK]    Clinical Course User Index [JK] Dorie Rank, MD   MDM Rules/Calculators/A&P                          Patient presented to the ED for evaluation of metastatic brain cancer.  Patient clearly is having speech difficulty.  Patient is otherwise alert and awake.  MRI of the brain does show metastatic brain lesions.  Laboratory tests are otherwise unremarkable.  Patient did have some chest pain while she was waiting in the ED.  EKG and troponin are normal.  I doubt ACS.  Patient's oncologist recommended initiation of antiseizure medications.  I have ordered Keppra.  Have also ordered a dose of Decadron for cerebral edema.  Plan on admission further treatment.  Final Clinical Impression(s) / ED Diagnoses Final diagnoses:  Metastatic cancer to brain [facility])     Dorie Rank, MD 12/22/20 2302

## 2020-12-22 NOTE — ED Notes (Signed)
Informed family that patient does not have orders to be directly admitted-spoke to RN at oncology office and per oncologist, patient is to be evaluated by EDP for possible admission

## 2020-12-22 NOTE — ED Notes (Signed)
Pt expressed weakness and chest pain.  Pt did not want any blood work done.   Informed RN.

## 2020-12-22 NOTE — ED Triage Notes (Signed)
Pt arrived via walk in, states last night she developed from slurred speech and blurred vision, went to ED for rule out stoke, scans showed new brain lesions. Pt now endorsing worsening fatigue and nausea in triage. No new neuro deficits.

## 2020-12-22 NOTE — Telephone Encounter (Signed)
[name], sister. Sandra Arroyo went to ER last night and was discharged after scan. She is home now in [place].

## 2020-12-22 NOTE — Telephone Encounter (Signed)
Pls call family

## 2020-12-23 ENCOUNTER — Ambulatory Visit
Admit: 2020-12-23 | Discharge: 2020-12-23 | Disposition: A | Discharge status: Home / Self Care | Payer: Medicare Other | Attending: Radiation Oncology | Admitting: Radiation Oncology

## 2020-12-23 ENCOUNTER — Other Ambulatory Visit: Payer: Self-pay | Admitting: Radiation Oncology

## 2020-12-23 DIAGNOSIS — Z923 Personal history of irradiation: Secondary | ICD-10-CM | POA: Diagnosis not present

## 2020-12-23 DIAGNOSIS — C7931 Secondary malignant neoplasm of brain: Secondary | ICD-10-CM | POA: Diagnosis present

## 2020-12-23 DIAGNOSIS — C7800 Secondary malignant neoplasm of unspecified lung: Secondary | ICD-10-CM | POA: Diagnosis present

## 2020-12-23 DIAGNOSIS — R531 Weakness: Secondary | ICD-10-CM

## 2020-12-23 DIAGNOSIS — Z9221 Personal history of antineoplastic chemotherapy: Secondary | ICD-10-CM | POA: Diagnosis not present

## 2020-12-23 DIAGNOSIS — C55 Malignant neoplasm of uterus, part unspecified: Secondary | ICD-10-CM | POA: Diagnosis not present

## 2020-12-23 DIAGNOSIS — G936 Cerebral edema: Secondary | ICD-10-CM | POA: Diagnosis present

## 2020-12-23 DIAGNOSIS — R42 Dizziness and giddiness: Secondary | ICD-10-CM | POA: Diagnosis present

## 2020-12-23 DIAGNOSIS — T380X5A Adverse effect of glucocorticoids and synthetic analogues, initial encounter: Secondary | ICD-10-CM | POA: Diagnosis not present

## 2020-12-23 DIAGNOSIS — Z7982 Long term (current) use of aspirin: Secondary | ICD-10-CM | POA: Diagnosis not present

## 2020-12-23 DIAGNOSIS — E119 Type 2 diabetes mellitus without complications: Secondary | ICD-10-CM

## 2020-12-23 DIAGNOSIS — R29810 Facial weakness: Secondary | ICD-10-CM | POA: Diagnosis not present

## 2020-12-23 DIAGNOSIS — Z7984 Long term (current) use of oral hypoglycemic drugs: Secondary | ICD-10-CM | POA: Diagnosis not present

## 2020-12-23 DIAGNOSIS — Z79890 Hormone replacement therapy (postmenopausal): Secondary | ICD-10-CM | POA: Diagnosis not present

## 2020-12-23 DIAGNOSIS — I10 Essential (primary) hypertension: Secondary | ICD-10-CM | POA: Diagnosis present

## 2020-12-23 DIAGNOSIS — R4781 Slurred speech: Secondary | ICD-10-CM

## 2020-12-23 DIAGNOSIS — R12 Heartburn: Secondary | ICD-10-CM | POA: Diagnosis not present

## 2020-12-23 DIAGNOSIS — E213 Hyperparathyroidism, unspecified: Secondary | ICD-10-CM | POA: Diagnosis present

## 2020-12-23 DIAGNOSIS — E876 Hypokalemia: Secondary | ICD-10-CM | POA: Diagnosis present

## 2020-12-23 DIAGNOSIS — Z20822 Contact with and (suspected) exposure to covid-19: Secondary | ICD-10-CM | POA: Diagnosis present

## 2020-12-23 DIAGNOSIS — C541 Malignant neoplasm of endometrium: Secondary | ICD-10-CM | POA: Diagnosis present

## 2020-12-23 DIAGNOSIS — K219 Gastro-esophageal reflux disease without esophagitis: Secondary | ICD-10-CM | POA: Diagnosis present

## 2020-12-23 DIAGNOSIS — Z90710 Acquired absence of both cervix and uterus: Secondary | ICD-10-CM | POA: Diagnosis not present

## 2020-12-23 DIAGNOSIS — Z833 Family history of diabetes mellitus: Secondary | ICD-10-CM | POA: Diagnosis not present

## 2020-12-23 DIAGNOSIS — E039 Hypothyroidism, unspecified: Secondary | ICD-10-CM | POA: Diagnosis present

## 2020-12-23 LAB — HEMOGLOBIN A1C
Hgb A1c MFr Bld: 6.9 % — ABNORMAL HIGH (ref 4.8–5.6)
Mean Plasma Glucose: 151.33 mg/dL

## 2020-12-23 LAB — CBG MONITORING, ED
Glucose-Capillary: 126 mg/dL — ABNORMAL HIGH (ref 70–99)
Glucose-Capillary: 170 mg/dL — ABNORMAL HIGH (ref 70–99)
Glucose-Capillary: 209 mg/dL — ABNORMAL HIGH (ref 70–99)
Glucose-Capillary: 246 mg/dL — ABNORMAL HIGH (ref 70–99)

## 2020-12-23 LAB — GLUCOSE, CAPILLARY: Glucose-Capillary: 191 mg/dL — ABNORMAL HIGH (ref 70–99)

## 2020-12-23 MED ORDER — TERAZOSIN HCL 1 MG PO CAPS
2.0000 mg | ORAL_CAPSULE | Freq: Every day | ORAL | Status: DC
  Administered 2020-12-23 – 2020-12-24 (×3): 2 mg via ORAL
  Filled 2020-12-23: qty 2
  Filled 2020-12-23: qty 1
  Filled 2020-12-23: qty 2
  Filled 2020-12-23 (×2): qty 1
  Filled 2020-12-23: qty 2

## 2020-12-23 MED ORDER — PANTOPRAZOLE SODIUM 40 MG PO TBEC
40.0000 mg | DELAYED_RELEASE_TABLET | Freq: Every day | ORAL | Status: DC
  Administered 2020-12-23 – 2020-12-25 (×3): 40 mg via ORAL
  Filled 2020-12-23 (×3): qty 1

## 2020-12-23 MED ORDER — LOSARTAN POTASSIUM 50 MG PO TABS
100.0000 mg | ORAL_TABLET | Freq: Every day | ORAL | Status: DC
Start: 2020-12-23 — End: 2020-12-25
  Administered 2020-12-23 – 2020-12-25 (×3): 100 mg via ORAL
  Filled 2020-12-23 (×2): qty 2
  Filled 2020-12-23: qty 4

## 2020-12-23 MED ORDER — ASPIRIN EC 81 MG PO TBEC
81.0000 mg | DELAYED_RELEASE_TABLET | Freq: Every day | ORAL | Status: DC
  Administered 2020-12-23 – 2020-12-25 (×3): 81 mg via ORAL
  Filled 2020-12-23 (×3): qty 1

## 2020-12-23 MED ORDER — INSULIN ASPART 100 UNIT/ML ~~LOC~~ SOLN
0.0000 [IU] | Freq: Every day | SUBCUTANEOUS | Status: DC
  Administered 2020-12-24: 2 [IU] via SUBCUTANEOUS
  Filled 2020-12-23: qty 0.05

## 2020-12-23 MED ORDER — INSULIN ASPART 100 UNIT/ML ~~LOC~~ SOLN
0.0000 [IU] | Freq: Three times a day (TID) | SUBCUTANEOUS | Status: DC
  Administered 2020-12-23: 5 [IU] via SUBCUTANEOUS
  Administered 2020-12-23: 3 [IU] via SUBCUTANEOUS
  Administered 2020-12-23: 5 [IU] via SUBCUTANEOUS
  Administered 2020-12-24: 8 [IU] via SUBCUTANEOUS
  Administered 2020-12-24 (×2): 3 [IU] via SUBCUTANEOUS
  Administered 2020-12-25: 5 [IU] via SUBCUTANEOUS
  Administered 2020-12-25: 3 [IU] via SUBCUTANEOUS
  Administered 2020-12-25: 8 [IU] via SUBCUTANEOUS
  Filled 2020-12-23: qty 0.15

## 2020-12-23 MED ORDER — AMLODIPINE BESYLATE 10 MG PO TABS
10.0000 mg | ORAL_TABLET | Freq: Every day | ORAL | Status: DC
  Administered 2020-12-23 – 2020-12-25 (×3): 10 mg via ORAL
  Filled 2020-12-23 (×2): qty 1
  Filled 2020-12-23: qty 2

## 2020-12-23 MED ORDER — ROSUVASTATIN CALCIUM 5 MG PO TABS
10.0000 mg | ORAL_TABLET | Freq: Every day | ORAL | Status: DC
  Administered 2020-12-23 – 2020-12-25 (×3): 10 mg via ORAL
  Filled 2020-12-23 (×2): qty 2
  Filled 2020-12-23: qty 1

## 2020-12-23 MED ORDER — HYDROCHLOROTHIAZIDE 25 MG PO TABS
25.0000 mg | ORAL_TABLET | Freq: Every day | ORAL | Status: DC
  Administered 2020-12-23: 25 mg via ORAL
  Filled 2020-12-23: qty 1

## 2020-12-23 MED ORDER — HYDROCODONE-ACETAMINOPHEN 5-325 MG PO TABS
1.0000 | ORAL_TABLET | Freq: Four times a day (QID) | ORAL | Status: DC | PRN
  Filled 2020-12-23 (×3): qty 1

## 2020-12-23 MED ORDER — ACETAMINOPHEN 650 MG RE SUPP: 650.0000 mg | Freq: Four times a day (QID) | RECTAL | Status: DC | PRN

## 2020-12-23 MED ORDER — CLONAZEPAM 0.5 MG PO TABS
0.5000 mg | ORAL_TABLET | Freq: Every evening | ORAL | Status: DC | PRN
  Administered 2020-12-23 – 2020-12-24 (×2): 0.5 mg via ORAL
  Filled 2020-12-23 (×2): qty 1

## 2020-12-23 MED ORDER — LEVOTHYROXINE SODIUM 25 MCG PO TABS
25.0000 ug | ORAL_TABLET | Freq: Every day | ORAL | Status: DC
  Administered 2020-12-23 – 2020-12-25 (×3): 25 ug via ORAL
  Filled 2020-12-23 (×3): qty 1

## 2020-12-23 MED ORDER — ONDANSETRON HCL 4 MG/2ML IJ SOLN
4.0000 mg | Freq: Four times a day (QID) | INTRAMUSCULAR | Status: DC | PRN
  Filled 2020-12-23: qty 2

## 2020-12-23 MED ORDER — ONDANSETRON HCL 4 MG PO TABS: 4.0000 mg | ORAL_TABLET | Freq: Four times a day (QID) | ORAL | Status: DC | PRN

## 2020-12-23 MED ORDER — METOPROLOL SUCCINATE ER 100 MG PO TB24
100.0000 mg | ORAL_TABLET | Freq: Every day | ORAL | Status: DC
  Administered 2020-12-23 – 2020-12-25 (×3): 100 mg via ORAL
  Filled 2020-12-23: qty 1
  Filled 2020-12-23: qty 2
  Filled 2020-12-23: qty 1

## 2020-12-23 MED ORDER — LENVATINIB (10 MG DAILY DOSE) 10 MG PO CPPK: 10.0000 mg | ORAL_CAPSULE | Freq: Every day | ORAL | Status: DC

## 2020-12-23 MED ORDER — ACETAMINOPHEN 325 MG PO TABS: 650.0000 mg | ORAL_TABLET | Freq: Four times a day (QID) | ORAL | Status: DC | PRN

## 2020-12-23 MED ORDER — FAMOTIDINE 20 MG PO TABS
20.0000 mg | ORAL_TABLET | Freq: Two times a day (BID) | ORAL | Status: DC
  Administered 2020-12-23 – 2020-12-25 (×5): 20 mg via ORAL
  Filled 2020-12-23 (×5): qty 1

## 2020-12-23 MED ORDER — HYDRALAZINE HCL 25 MG PO TABS
25.0000 mg | ORAL_TABLET | Freq: Three times a day (TID) | ORAL | Status: DC
  Administered 2020-12-23 – 2020-12-25 (×7): 25 mg via ORAL
  Filled 2020-12-23 (×7): qty 1

## 2020-12-23 NOTE — Consult Note (Signed)
Radiation Oncology         ([phone]) [phone] ________________________________  Name: Sandra Arroyo        MRN: [id number]  Date of Service: 12/22/2020 DOB: 11-02-1951  [id number], Crosby Oyster, DO  No ref. provider found     REFERRING PHYSICIAN: No ref. provider found   DIAGNOSIS: The primary encounter diagnosis was Metastatic cancer to brain [facility]). A diagnosis of Heartburn was also pertinent to this visit.   HISTORY OF PRESENT ILLNESS: Sandra Arroyo is a 70 y.o. female seen at the request of Dr. Alvy Bimler for a new diagnosis of brain metastases in the setting of a history of metastatic uterine carcinoma. 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  She did have an elevated CA-125 of 70 around the time of her diagnosis, she received adjuvant chemotherapy followed by adjuvant pelvic radiation totaling 50.4 Gy over 5 weeks time in [place].  She has had lung nodules also present dating back to her staging scans in early 2019.  She did undergo repeat biopsy of supraclavicular nodes that were seen in the fall 2020 which confirmed metastatic adenocarcinoma consistent with her prior primary.  She began systemic chemotherapy with docetaxel carboplatin in [place], she relocated to [place] for her care and while she maintains her address in [place] continues to stay with her sister who lives in [place] and comes into town for treatment.  She has been on oral lenvatinib and pembrolizumab since October 2021 recent imaging showed a mixed response in the chest with an increase in size of her left hilar adenopathy and improving metastatic pulmonary disease.  She  presented to the hospital however in the last 24 hours having increasing dizziness and word finding deficits.  An MRI of the brain with and without contrast unfortunately showed at least 4 lesions in the brain the largest of which measured 4.2 cm with nodular enhancement and moderate surrounding vasogenic edema in the left temporoparietal junction the second largest lesion was in the left frontal lobe measuring 1.6 cm in greatest dimension a third 5 mm enhancing lesion in the cortex of the right frontal lobe was noted and the measurement of the fourth was not documented.  She is contacted today to discuss options of stereotactic radiosurgery.     PREVIOUS RADIATION THERAPY: Yes   07/09/18-08/17/18: The pelvis was treated to 50.4 Gy in 28 fractions in [place], [place].    PAST MEDICAL HISTORY:  Past Medical History:  Diagnosis Date  . Cancer ([place]) 2019  . Diabetes mellitus without complication ([place])   . Hyperparathyroidism, unspecified ([place])   . Hypertension        PAST SURGICAL HISTORY: Past Surgical History:  Procedure Laterality Date  . ABDOMINAL HYSTERECTOMY    . CESAREAN SECTION    . PARATHYROIDECTOMY    . TONSILLECTOMY       FAMILY HISTORY:  Family History  Problem Relation Age of Onset  . Cancer Mother 32       breast ca  . Cancer Father        testicular, kidney cancer, liver  . Stroke Father   . Diabetes Father   . Cancer Brother        testicular cancer     SOCIAL  HISTORY:  reports that she has never smoked. She has never used smokeless tobacco. She reports that she does not drink alcohol. The patient is married and lives in [place] but comes to [place] to stay with her sister. She has three adult sons who are actively involved in her care as well.    ALLERGIES: Lisinopril, Verapamil, and Other   MEDICATIONS:  Current Facility-Administered Medications  Medication Dose Route Frequency Provider Last Rate Last Admin  . acetaminophen (TYLENOL) tablet 650  mg  650 mg Oral Q6H PRN Etta Quill, DO       Or  . acetaminophen (TYLENOL) suppository 650 mg  650 mg Rectal Q6H PRN Etta Quill, DO      . amLODipine (NORVASC) tablet 10 mg  10 mg Oral Daily Jennette Kettle M, DO   10 mg at 12/23/20 0901  . aspirin EC tablet 81 mg  81 mg Oral Daily Etta Quill, DO   81 mg at 12/23/20 0901  . clonazePAM (KLONOPIN) tablet 0.5 mg  0.5 mg Oral QHS PRN Etta Quill, DO      . dexamethasone (DECADRON) tablet 4 mg  4 mg Oral Q6H Dorie Rank, MD   4 mg at 12/23/20 1100  . famotidine (PEPCID) tablet 20 mg  20 mg Oral BID Alvy Bimler, Ni, MD   20 mg at 12/23/20 1100  . hydrALAZINE (APRESOLINE) tablet 25 mg  25 mg Oral TID Etta Quill, DO   25 mg at 12/23/20 0901  . HYDROcodone-acetaminophen (NORCO/VICODIN) 5-325 MG per tablet 1 tablet  1 tablet Oral Q6H PRN Etta Quill, DO      . insulin aspart (novoLOG) injection 0-15 Units  0-15 Units Subcutaneous TID WC Etta Quill, DO   5 Units at 12/23/20 1117  . insulin aspart (novoLOG) injection 0-5 Units  0-5 Units Subcutaneous QHS Jennette Kettle M, DO      . levETIRAcetam (KEPPRA) tablet 500 mg  500 mg Oral BID Jennette Kettle M, DO   500 mg at 12/23/20 0900  . levothyroxine (SYNTHROID) tablet 25 mcg  25 mcg Oral Q0600 Etta Quill, DO   25 mcg at 12/23/20 0559  . losartan (COZAAR) tablet 100 mg  100 mg Oral Daily Jennette Kettle M, DO   100 mg at 12/23/20 0900  . metoprolol succinate (TOPROL-XL) 24 hr tablet 100 mg  100 mg Oral Daily Jennette Kettle M, DO   100 mg at 12/23/20 0900  . ondansetron (ZOFRAN) tablet 4 mg  4 mg Oral Q6H PRN Etta Quill, DO       Or  . ondansetron [facility]) injection 4 mg  4 mg Intravenous Q6H PRN Etta Quill, DO      . pantoprazole (PROTONIX) EC tablet 40 mg  40 mg Oral Daily Alvy Bimler, Ni, MD   40 mg at 12/23/20 1100  . rosuvastatin (CRESTOR) tablet 10 mg  10 mg Oral Daily Jennette Kettle M, DO   10 mg at 12/23/20 1100  . terazosin (HYTRIN) capsule 2 mg  2 mg Oral  QHS Jennette Kettle M, DO   2 mg at 12/23/20 [id number]   Current Outpatient Medications  Medication Sig Dispense Refill  . amLODipine (NORVASC) 10 MG tablet Take 1 tablet (10 mg total) by mouth daily. 90 tablet 3  . aspirin EC 81 MG tablet Take 81 mg by mouth daily.    . clonazePAM (KLONOPIN) 0.5 MG tablet Take 1 tablet (0.5 mg total) by mouth at bedtime. (Patient  taking differently: Take 0.5 mg by mouth at bedtime as needed (sleep).) 30 tablet 4  . glipiZIDE (GLUCOTROL) 5 MG tablet Take 0.5 tablets by mouth 2 (two) times daily.    . hydrALAZINE (APRESOLINE) 25 MG tablet Take 1 tablet (25 mg total) by mouth 3 (three) times daily. 270 tablet 3  . hydrochlorothiazide (HYDRODIURIL) 25 MG tablet Take 1 tablet (25 mg total) by mouth daily. 90 tablet 3  . HYDROcodone-acetaminophen (NORCO/VICODIN) 5-325 MG tablet Take 1 tablet by mouth every 6 (six) hours as needed. (Patient taking differently: Take 1 tablet by mouth every 6 (six) hours as needed for moderate pain.) 60 tablet 0  . lenvatinib 10 mg daily dose (LENVIMA, 10 MG DAILY DOSE,) capsule Take 1 capsule (10 mg total) by mouth daily. 30 capsule 11  . levothyroxine (SYNTHROID) 25 MCG tablet Take 1 tablet (25 mcg total) by mouth daily before breakfast. 30 tablet 11  . lidocaine-prilocaine (EMLA) cream Apply to affected area once 30 g 3  . losartan (COZAAR) 100 MG tablet Take 100 mg by mouth daily.    . metoprolol succinate (TOPROL-XL) 100 MG 24 hr tablet Take 100 mg by mouth daily. Take with or immediately following a meal.    . ondansetron (ZOFRAN) 8 MG tablet Take 1 tablet (8 mg total) by mouth every 8 (eight) hours as needed (Nausea or vomiting). 30 tablet 1  . prochlorperazine (COMPAZINE) 10 MG tablet Take 1 tablet (10 mg total) by mouth every 6 (six) hours as needed (Nausea or vomiting). 30 tablet 1  . rosuvastatin (CRESTOR) 10 MG tablet Take 1 tablet (10 mg total) by mouth daily. 90 tablet 2  . terazosin (HYTRIN) 2 MG capsule Take 1 capsule (2 mg  total) by mouth at bedtime. 30 capsule 1     REVIEW OF SYSTEMS: On review of systems, the patient reports that she has not had any headaches, seizures, or overall weakness. But she has noticed some loss of function in her right hand for several months that was felt to be the result of carpal tunnel. She has had persistent and progressive word finding difficulties and dizziness, but has been able to still engage in conversation. No other complaints are verbalized.     PHYSICAL EXAM:  Wt Readings from Last 3 Encounters:  12/11/20 151 lb 8 oz (68.7 kg)  12/01/20 153 lb (69.4 kg)  11/16/20 152 lb (68.9 kg)   Temp Readings from Last 3 Encounters:  12/22/20 97.9 F (36.6 C) (Oral)  12/11/20 97.8 F (36.6 C) (Oral)  12/01/20 98.1 F (36.7 C) (Tympanic)   BP Readings from Last 3 Encounters:  12/23/20 119/67  12/11/20 (!) 113/59  12/01/20 120/69   Pulse Readings from Last 3 Encounters:  12/23/20 (!) 58  12/11/20 (!) 51  12/01/20 (!) 59   Pain Assessment Pain Score: 3 /10  Unable to assess due to encounter type.   ECOG = 0  0 - Asymptomatic (Fully active, able to carry on all predisease activities without restriction)  1 - Symptomatic but completely ambulatory (Restricted in physically strenuous activity but ambulatory and able to carry out work of a light or sedentary nature. For example, light housework, office work)  2 - Symptomatic, <50% in bed during the day (Ambulatory and capable of all self care but unable to carry out any work activities. Up and about more than 50% of waking hours)  3 - Symptomatic, >50% in bed, but not bedbound (Capable of only limited self-care, confined to bed  or chair 50% or more of waking hours)  4 - Bedbound (Completely disabled. Cannot carry on any self-care. Totally confined to bed or chair)  5 - Death   Eustace Pen MM, Creech RH, Tormey DC, et al. [phone]). "Toxicity and response criteria of the [facility]". [place]  Oncol. 5 (6): 649-55    LABORATORY DATA:  Lab Results  Component Value Date   WBC 6.7 12/22/2020   HGB 15.3 (H) 12/22/2020   HCT 44.0 12/22/2020   MCV 92.1 12/22/2020   PLT 215 12/22/2020   Lab Results  Component Value Date   NA 134 (L) 12/22/2020   K 4.3 12/22/2020   CL 98 12/22/2020   CO2 18 (L) 12/22/2020   Lab Results  Component Value Date   ALT 18 12/22/2020   AST 35 12/22/2020   ALKPHOS 48 12/22/2020   BILITOT 1.8 (H) 12/22/2020      RADIOGRAPHY: MR Brain W and Wo Contrast  Result Date: 12/22/2020 CLINICAL DATA:  Metastatic disease evaluation. Additional provided: Slurred speech, generalized weakness as of today. Additional history obtained from [place] NUMBERHistory of metastatic uterine cancer. EXAM: MRI HEAD WITHOUT AND WITH CONTRAST TECHNIQUE: Multiplanar, multiecho pulse sequences of the brain and surrounding structures were obtained without and with intravenous contrast. CONTRAST:  15mL GADAVIST GADOBUTROL 1 MMOL/ML IV SOLN COMPARISON:  No pertinent prior exams available for comparison. FINDINGS: Brain: Mild intermittent motion degradation, limiting evaluation. This includes mild motion degradation of the T1 weighted postcontrast sequences. Cerebral volume is normal for age. There are multiple enhancing parental lesions likely reflecting intracranial metastatic disease, as follows. Within the left frontoparietal lobes and extending to the left temporoparietal junction, there is a centrally necrotic mass with peripheral curvilinear and nodular enhancement measuring 4.2 x 3.8 x 3.6 cm (series 15, image 39). Moderate surrounding vasogenic edema. Notably, this mass involves the left pre and postcentral gyri. Associated mass effect results in partial effacement of the atrium of the left lateral ventricle. More anteriorly within the left frontal lobe, there is a 1.4 x 1.6 x 1.6 cm lesion within the left superior frontal, and possibly middle frontal, gyri. Precontrast  T1 hyperintensity limits evaluation for enhancement. Additionally, there is SWI signal loss associated with this lesion and findings likely reflect nonacute hemorrhage. Mild surrounding vasogenic edema. There is a punctate satellite lesion along its anterolateral aspect (series 15, image 45). 5 mm enhancing lesion within the cortex of the mid right frontal lobe (series 15, image 42). No significant surrounding edema. Some restricted diffusion is associated with the metastatic lesions. However, there is no evidence of acute infarction. Background mild multifocal T2/FLAIR hyperintensity within the cerebral white matter is nonspecific, but compatible with chronic small vessel ischemic disease. There are a few punctate foci of SWI signal loss within the bilateral cerebral hemispheres compatible with nonspecific chronic microhemorrhages. No extra-axial fluid collection. No midline shift. Vascular: Expected proximal arterial flow voids. Skull and upper cervical spine: No focal marrow lesion Sinuses/Orbits: Visualized orbits show no acute finding. Mild bilateral ethmoid and maxillary sinus mucosal thickening. Other: Nonspecific 1.8 x 1.9 cm right parietal scalp lesion (series 8, image 25) (series 14, image 11). IMPRESSION: The examination is motion degraded and this may obscure small parenchymal lesions. Four parenchymal lesions are identified within the bilateral cerebral hemispheres, as described and likely reflecting intracranial metastatic disease. The largest lesion measures 4.2 x 3.8 x 3.6 cm and is located within the left frontoparietal lobes with extension to the left temporoparietal  junction. Moderate surrounding vasogenic edema. Regional mass effect with partial effacement of the left lateral ventricle atrium. Notably, this dominant lesion involves the left pre and postcentral gyri. 1.8 x 1.9 cm nonspecific right parietal scalp lesion. Direct visualization is recommended. Mild cerebral white matter chronic small  vessel ischemic disease. Mild paranasal sinus disease as described. Electronically Signed   By: Kellie Simmering DO   On: 12/22/2020 21:51   CT CHEST ABDOMEN PELVIS W CONTRAST  Result Date: 11/30/2020 CLINICAL DATA:  Restaging uterine cancer. Chemotherapy and radiation therapy complete. Ongoing immunotherapy. EXAM: CT CHEST, ABDOMEN, AND PELVIS WITH CONTRAST TECHNIQUE: Multidetector CT imaging of the chest, abdomen and pelvis was performed following the standard protocol during bolus administration of intravenous contrast. CONTRAST:  143mL OMNIPAQUE IOHEXOL 300 MG/ML  SOLN COMPARISON:  CT abdomen pelvis 08/31/2020 and CT chest 03/09/2020. FINDINGS: CT CHEST FINDINGS Cardiovascular: Right IJ Port-A-Cath terminates in the low SVC. Atherosclerotic calcification of the aorta and coronary arteries. Pulmonic trunk and heart are enlarged. No pericardial effusion. Mediastinum/Nodes: Low right paratracheal lymph node measures 7 mm (2/25), stable. Left hilar lymph nodes measure up to 1.3 cm (2/26), increased from 8 mm. No axillary adenopathy. Esophagus is grossly unremarkable. Lungs/Pleura: Some pulmonary nodules have resolved in the interval. For example, a previously seen 8 mm lateral right lower lobe nodule (6/111) has resolved when compared with 08/31/2020. Other small pulmonary nodules are stable, measuring up to 6 mm in the lateral right lower lobe (6/114). No pleural fluid. Airway is unremarkable. Musculoskeletal: Postoperative changes in the proximal right humerus. Two faint areas of sclerosis in the proximal humerus (6/10) are unchanged. Mild compression of the T2 vertebral body is unchanged. CT ABDOMEN PELVIS FINDINGS Hepatobiliary: Liver is decreased in attenuation diffusely. Liver is otherwise unremarkable. Cholecystectomy. No biliary ductal dilatation. Pancreas: Negative. Spleen: Negative. Adrenals/Urinary Tract: Adrenal glands are unremarkable. Low-attenuation lesions in the kidneys measure up to 2.8 cm on the  right and are indicative of cysts. A 10 mm hyperdense lesion off the lower pole right kidney (2/75) and a 1.3 cm hyperdense lesion off the lower pole left kidney appear unchanged but difficult to further characterize due to size and lack of precontrast imaging. Ureters are decompressed. Small amount of air in the bladder is presumably iatrogenic. Stomach/Bowel: Small hiatal hernia. Stomach, small bowel, appendix and colon are otherwise unremarkable. Vascular/Lymphatic: Atherosclerotic calcification of the aorta without aneurysm. No pathologically enlarged lymph nodes. Specifically, abdominal retroperitoneal lymph nodes are subcentimeter in size, as before. Reproductive: Hysterectomy.  No adnexal mass. Other: No free fluid.  Mesenteries and peritoneum are unremarkable. Musculoskeletal: Degenerative changes in the spine. No worrisome lytic or sclerotic lesions. Degenerative changes in the hips. Mild levoconvex scoliosis of the lumbar spine. IMPRESSION: 1. Improving pulmonary metastatic disease. 2. Left hilar lymph nodes have increased in size slightly in the interval. Subcentimeter abdominal retroperitoneal lymph nodes are stable. 3. Hyperattenuating lesions in the inferior aspects of both kidneys are too small to characterize. Further evaluation with pre and post contrast MRI should be considered. Pre and post contrast CT could alternatively be performed, but would likely be of decreased accuracy given lesion size. 4. Hepatic steatosis. 5. Aortic atherosclerosis (ICD10-I70.0). Coronary artery calcification. 6. Enlarged pulmonic trunk, indicative of pulmonary arterial hypertension. Electronically Signed   By: Lorin Picket M.D.   On: 11/30/2020 11:21       IMPRESSION/PLAN: 1. 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 Dr. Lisbeth Renshaw discusses the pathology findings and reviews the nature of  metastatic brain disease. She has been started on steroids with  dexamethasone and we are in agreement with this dosing for now. Dr. [name] has also reviewed her case. She would be a candidate for preoperative stereotactic radiosurgery [facility]). We reviewed the need for 3T MRI and have asked that she get transferrred to [facility] to have this performed. She would also need to come back to [facility] via [facility] tomorrow by 1:45 pm for simulation and mask making. Dr. Lisbeth Renshaw would anticipate radiation in a single fraction for preoperative approach on Tuesday of next week 12/29/20, and Dr. [name] would plan for craniotomy to remove the largest lesion in the left parietal lobe on Wednesday 01/06/2021. If she's had clinical improvement, she would likely be able to go home and come in for treatment and surgery from the outpatient setting.  We discussed the risks, benefits, short, and long term effects of radiotherapy, as well as the curative intent, and the patient is interested in proceeding. We will sign consent to proceed at the time of her simulation. During the discussion it was questioned whether or not she could have surgery due to her oral VEGF inhibitor, and I've reached out to Dr. Alvy Bimler for clarification. We also discussed that we would introduce her to Dr. Mickeal Skinner for long term follow up once she's either undergone surgery or posttreatment for surveillance.   In a visit lasting 80 minutes, greater than 50% of the time was spent by phone with the patient, her sister, and two sons all by phone, and in floor time discussing the patient's condition, in preparation for the discussion, and coordinating the patient's care.     Carola Rhine, PAC On behalf of Jodelle Gross, MD, PhD

## 2020-12-23 NOTE — H&P (Signed)
History and Physical    Sandra Arroyo [id number] DOB: 03-Nov-1951 DOA: 12/22/2020  PCP: [name], DO  Patient coming from: Home  I have personally briefly reviewed patient's old medical records in [place]  Chief Complaint: Brain mets  HPI: Sandra Arroyo is a 70 y.o. 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  Pt seeing Dr. Alvy Bimler with oncology.  Over past few days pt with new onset dizziness, blurred vision and speech.  Symptoms are constant, worsening.  Pt went to medical facility in [place].  CT head showed new mets with cerebral edema!  Called Dr.Gorsuch (see Dr. Calton Dach note) who recd pt go in to ED for admission for steroids, seizure ppx, and palliative radiation therapy).  No fever, chills.   ED Course: MRI confirms 4 new brain mets with vasogenic edema.   Review of Systems: As per HPI, otherwise all review of systems negative.  Past Medical History:  Diagnosis Date  . Cancer ([place]) 2019  . Diabetes mellitus without complication ([place])   . Hyperparathyroidism, unspecified ([place])   . Hypertension     Past Surgical History:  Procedure Laterality Date  . ABDOMINAL HYSTERECTOMY    . CESAREAN SECTION    . PARATHYROIDECTOMY    . TONSILLECTOMY       reports that she has never smoked. She has never used smokeless tobacco. She reports that she does not drink alcohol. No history on file for drug use.  Allergies  Allergen Reactions  . Lisinopril Swelling    Face swelling Face swelling   . Verapamil Rash  . Other Rash    ADHESIVES ADHESIVES ADHESIVES ADHESIVES     Family History  Problem Relation Age of Onset  . Cancer Mother 48       breast ca  . Cancer Father        testicular, kidney cancer, liver  . Stroke Father   . Diabetes Father   . Cancer Brother        testicular cancer     Prior to Admission medications   Medication Sig Start Date End Date Taking? Authorizing Provider  amLODipine  (NORVASC) 10 MG tablet Take 1 tablet (10 mg total) by mouth daily. 10/27/20  Yes Heath Lark, MD  aspirin EC 81 MG tablet Take 81 mg by mouth daily.   Yes [provider]  clonazePAM (KLONOPIN) 0.5 MG tablet Take 1 tablet (0.5 mg total) by mouth at bedtime. Patient taking differently: Take 0.5 mg by mouth at bedtime as needed (sleep). 09/15/20  Yes Gorsuch, Ni, MD  glipiZIDE (GLUCOTROL) 5 MG tablet Take 0.5 tablets by mouth 2 (two) times daily.   Yes [provider]  hydrALAZINE (APRESOLINE) 25 MG tablet Take 1 tablet (25 mg total) by mouth 3 (three) times daily. 09/28/20  Yes Gorsuch, Ni, MD  hydrochlorothiazide (HYDRODIURIL) 25 MG tablet Take 1 tablet (25 mg total) by mouth daily. 10/27/20  Yes Gorsuch, Ernst Spell, MD  HYDROcodone-acetaminophen (NORCO/VICODIN) 5-325 MG tablet Take 1 tablet by mouth every 6 (six) hours as needed. Patient taking differently: Take 1 tablet by mouth every 6 (six) hours as needed for moderate pain. 11/16/20  Yes Gorsuch, Ni, MD  lenvatinib 10 mg daily dose (LENVIMA, 10 MG DAILY DOSE,) capsule Take 1 capsule (10 mg total) by mouth daily. 09/02/20  Yes Heath Lark, MD  levothyroxine (SYNTHROID) 25 MCG tablet Take 1 tablet (25 mcg total) by mouth daily before breakfast. 12/11/20  Yes Heath Lark, MD  lidocaine-prilocaine (EMLA) cream Apply to affected area once 09/01/20  Yes Gorsuch, Ni, MD  losartan (COZAAR) 100 MG tablet Take 100 mg by mouth daily.   Yes [provider]  metoprolol succinate (TOPROL-XL) 100 MG 24 hr tablet Take 100 mg by mouth daily. Take with or immediately following a meal.   Yes [provider]  ondansetron (ZOFRAN) 8 MG tablet Take 1 tablet (8 mg total) by mouth every 8 (eight) hours as needed (Nausea or vomiting). 09/01/20  Yes Heath Lark, MD  prochlorperazine (COMPAZINE) 10 MG tablet Take 1 tablet (10 mg total) by mouth every 6 (six) hours as needed (Nausea or vomiting). 09/01/20  Yes Gorsuch, Ni, MD  rosuvastatin (CRESTOR)  10 MG tablet Take 1 tablet (10 mg total) by mouth daily. 05/12/20  Yes [name], DO  terazosin (HYTRIN) 2 MG capsule Take 1 capsule (2 mg total) by mouth at bedtime. 01/13/20  Yes Heath Lark, MD    Physical Exam: Vitals:   12/22/20 2115 12/22/20 2215 12/23/20 0004 12/23/20 0004  BP: (!) 139/105 132/74    Pulse: 64 61    Resp: 17 13 (!) 22   Temp:      TempSrc:      SpO2: 99% 99% 96% 96%    Constitutional: NAD, calm, comfortable Eyes: PERRL, lids and conjunctivae normal ENMT: Mucous membranes are moist. Posterior pharynx clear of any exudate or lesions.Normal dentition.  Neck: normal, supple, no masses, no thyromegaly Respiratory: clear to auscultation bilaterally, no wheezing, no crackles. Normal respiratory effort. No accessory muscle use.  Cardiovascular: Regular rate and rhythm, no murmurs / rubs / gallops. No extremity edema. 2+ pedal pulses. No carotid bruits.  Abdomen: no tenderness, no masses palpated. No hepatosplenomegaly. Bowel sounds positive.  Musculoskeletal: no clubbing / cyanosis. No joint deformity upper and lower extremities. Good ROM, no contractures. Normal muscle tone.  Skin: no rashes, lesions, ulcers. No induration Neurologic: Pt with some subtle but definate word finding difficulty, ie refers to herself as "he". Psychiatric: Normal judgment and insight. Alert and oriented x 3. Normal mood.    Labs on Admission: I have personally reviewed following labs and imaging studies  CBC: Recent Labs  Lab 12/22/20 1927  WBC 6.7  NEUTROABS 3.7  HGB 15.3*  HCT 44.0  MCV 92.1  PLT 123456   Basic Metabolic Panel: Recent Labs  Lab 12/22/20 1927  NA 134*  K 4.3  CL 98  CO2 18*  GLUCOSE 131*  BUN 22  CREATININE 1.18*  CALCIUM 10.6*   GFR: Estimated Creatinine Clearance: 40.2 mL/min (A) (by C-G formula based on SCr of 1.18 mg/dL (H)). Liver Function Tests: Recent Labs  Lab 12/22/20 1927  AST 35  ALT 18  ALKPHOS 48  BILITOT 1.8*  PROT 7.7   ALBUMIN 4.7   No results for input(s): LIPASE, AMYLASE in the last 168 hours. No results for input(s): AMMONIA in the last 168 hours. Coagulation Profile: No results for input(s): INR, PROTIME in the last 168 hours. Cardiac Enzymes: No results for input(s): CKTOTAL, CKMB, CKMBINDEX, TROPONINI in the last 168 hours. BNP (last 3 results) No results for input(s): PROBNP in the last 8760 hours. HbA1C: No results for input(s): HGBA1C in the last 72 hours. CBG: No results for input(s): GLUCAP in the last 168 hours. Lipid Profile: No results for input(s): CHOL, HDL, LDLCALC, TRIG, CHOLHDL, LDLDIRECT in the last 72 hours. Thyroid Function Tests: No results for input(s): TSH, T4TOTAL, FREET4, T3FREE, THYROIDAB in the last 72  hours. Anemia Panel: No results for input(s): VITAMINB12, FOLATE, FERRITIN, TIBC, IRON, RETICCTPCT in the last 72 hours. Urine analysis:    Component Value Date/Time   PROTEINUR 30 (A) 12/11/2020 1015    Radiological Exams on Admission: MR Brain W and Wo Contrast  Result Date: 12/22/2020 CLINICAL DATA:  Metastatic disease evaluation. Additional provided: Slurred speech, generalized weakness as of today. Additional history obtained from [place] NUMBERHistory of metastatic uterine cancer. EXAM: MRI HEAD WITHOUT AND WITH CONTRAST TECHNIQUE: Multiplanar, multiecho pulse sequences of the brain and surrounding structures were obtained without and with intravenous contrast. CONTRAST:  64mL GADAVIST GADOBUTROL 1 MMOL/ML IV SOLN COMPARISON:  No pertinent prior exams available for comparison. FINDINGS: Brain: Mild intermittent motion degradation, limiting evaluation. This includes mild motion degradation of the T1 weighted postcontrast sequences. Cerebral volume is normal for age. There are multiple enhancing parental lesions likely reflecting intracranial metastatic disease, as follows. Within the left frontoparietal lobes and extending to the left temporoparietal  junction, there is a centrally necrotic mass with peripheral curvilinear and nodular enhancement measuring 4.2 x 3.8 x 3.6 cm (series 15, image 39). Moderate surrounding vasogenic edema. Notably, this mass involves the left pre and postcentral gyri. Associated mass effect results in partial effacement of the atrium of the left lateral ventricle. More anteriorly within the left frontal lobe, there is a 1.4 x 1.6 x 1.6 cm lesion within the left superior frontal, and possibly middle frontal, gyri. Precontrast T1 hyperintensity limits evaluation for enhancement. Additionally, there is SWI signal loss associated with this lesion and findings likely reflect nonacute hemorrhage. Mild surrounding vasogenic edema. There is a punctate satellite lesion along its anterolateral aspect (series 15, image 45). 5 mm enhancing lesion within the cortex of the mid right frontal lobe (series 15, image 42). No significant surrounding edema. Some restricted diffusion is associated with the metastatic lesions. However, there is no evidence of acute infarction. Background mild multifocal T2/FLAIR hyperintensity within the cerebral white matter is nonspecific, but compatible with chronic small vessel ischemic disease. There are a few punctate foci of SWI signal loss within the bilateral cerebral hemispheres compatible with nonspecific chronic microhemorrhages. No extra-axial fluid collection. No midline shift. Vascular: Expected proximal arterial flow voids. Skull and upper cervical spine: No focal marrow lesion Sinuses/Orbits: Visualized orbits show no acute finding. Mild bilateral ethmoid and maxillary sinus mucosal thickening. Other: Nonspecific 1.8 x 1.9 cm right parietal scalp lesion (series 8, image 25) (series 14, image 11). IMPRESSION: The examination is motion degraded and this may obscure small parenchymal lesions. Four parenchymal lesions are identified within the bilateral cerebral hemispheres, as described and likely reflecting  intracranial metastatic disease. The largest lesion measures 4.2 x 3.8 x 3.6 cm and is located within the left frontoparietal lobes with extension to the left temporoparietal junction. Moderate surrounding vasogenic edema. Regional mass effect with partial effacement of the left lateral ventricle atrium. Notably, this dominant lesion involves the left pre and postcentral gyri. 1.8 x 1.9 cm nonspecific right parietal scalp lesion. Direct visualization is recommended. Mild cerebral white matter chronic small vessel ischemic disease. Mild paranasal sinus disease as described. Electronically Signed   By: Kellie Simmering DO   On: 12/22/2020 21:51    EKG: Independently reviewed.  Assessment/Plan Principal Problem:   Metastasis to brain [facility]) Active Problems:   Uterine cancer ([place])   Diabetes mellitus without complication, without long-term current use of insulin ([place])   Essential hypertension    1. Uterine CA with new brain mets -  1. Cont keppra 500mg  PO BID for the moment (got load in ED). 2. Decadron 4mg  PO Q6H 3. Added Dr. Alvy Bimler to consultant list 4. Call Rad-onc in AM for consult 5. Consider NS consult in AM, but doubt pt will be a surgical case given multiple mets. 2. DM2 -  1. Hold home metformin 2. Mod scale SSI AC/HS while on steroids 3. HTN - 1. Cont home BP meds 2. Not formally diagnosed with OSA, but suspect pt has underlying undiagnosed OSA driving her need for numerous BP meds as demonstrated by her desating on room air slightly while asleep in ED.  DVT prophylaxis: SCDs for now - defer chemo ppx decision to day team / onc (risk of bleed with brain mets?) Code Status: Full Family Communication:  No family in room Disposition Plan: Home after cleared by oncology and rad onc Consults called: None Admission status: Place in 61    Matison Nuccio, [place] Hospitalists  How to contact the [facility] Attending or Consulting provider [place] or covering provider during after hours  [place], for this patient?  1. Check the care team in [facility] and look for a) attending/consulting [facility] provider listed and b) the [facility] team listed 2. Log into [email or web address]  Amion Physician Scheduling and messaging for groups and whole hospitals  On call and physician scheduling software for group practices, residents, hospitalists and other medical providers for call, clinic, rotation and shift schedules. On[employer] is a hospital-wide system for scheduling doctors and paging doctors on call. EasyPlot is for scientific plotting and data analysis.  [email or web address]  and use [place]'s universal password to access. If you do not have the password, please contact the hospital operator.  3. Locate the [facility] provider you are looking for under [facility] and page to a number that you can be directly reached. 4. If you still have difficulty reaching the provider, please page the [facility] (Director on Call) for the Hospitalists listed on amion for assistance.  12/23/2020, 12:17 AM

## 2020-12-23 NOTE — ED Notes (Signed)
Pt assisted to bedside commode and back in bed. Pt given meal tray.

## 2020-12-23 NOTE — ED Notes (Signed)
Patient assisted to bedside toilet by this RN.

## 2020-12-23 NOTE — ED Notes (Addendum)
Patient assisted to bedside toilet and back to bed by this RN. Patient provided with water at bedside.

## 2020-12-23 NOTE — Progress Notes (Signed)
Patient placed in observation after midnight, please see H&P.  Here with findings of brain mets.  To be seen by Radiation Oncology.  keppra and steroids started in the ER. Seen by Dr. Alvy Bimler.  Currently having some trouble with speech.  Eulogio Bear DO

## 2020-12-23 NOTE — ED Notes (Signed)
Pt's oxygen saturation level dropped to 84% on RA when she fell asleep. Pt denies any known hx of sleep apnea. Pt placed on 2L of oxygen by nasal cannula and her oxygen level went back to the 90s. MD made aware.

## 2020-12-23 NOTE — ED Notes (Signed)
Pt resting with eyes closed. Pulse ox dropped to 86%. Nasal canula placed on pt with 2L. Pt repositioned in bed.

## 2020-12-23 NOTE — Progress Notes (Signed)
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 Unfortunately, she has new neurological symptoms and MRI confirmed intracranial metastasis I have reviewed MRI findings with her son and sister The patient is symptomatic I recommend continue high-dose dexamethasone for now I have placed urgent consult to radiation oncology to evaluate the patient for palliative radiation therapy I have discontinue her current chemotherapy medication, Lenvima I recommend continue Keppra for seizure prophylaxis Continue aggressive supportive care for now  Diabetes She would be at risk of severe hyperglycemia on steroids If her neurological symptoms improve, we can start dexamethasone taper She will need close monitoring of her blood sugar and insulin as needed  Hypercalcemia She is known to have chronic hypercalcemia Observe closely for now  Heartburn Likely exacerbated by steroids I have started her on Protonix and Pepcid  Weakness and slurring of speech We will consult PT OT in the hospital  Goals of care Stabilization of neurological deficit prior to discharge  Discharge planning Hopefully 3 to 5 days  All questions were answered. The patient knows to call the clinic with any problems, questions or concerns.   Heath Lark, MD 12/23/2020 10:39 AM  Subjective:  Patient is well-known to me She is undergoing palliative chemotherapy when she presented with slurred speech, word finding difficulties, changes in her vision and signs of neurological deficit Yesterday, she went to the emergency department in a local facility and had CT imaging with IV contrast which showed intracranial lesions She was directed to [facility] emergency department and had MRI overnight which showed and confirmed intracranial metastasis She was started on seizure prophylaxis and steroids This morning, she felt a little better Her son is present  with her I spoke with her sister over the phone afterwards  Objective:  Vitals:   12/23/20 0840 12/23/20 0900  BP: 114/67 114/67  Pulse: (!) 58 66  Resp: 16 16  Temp:    SpO2: 96% 94%    No intake or output data in the 24 hours ending 12/23/20 1039  GENERAL:alert, no distress and comfortable SKIN: skin color, texture, turgor are normal, no rashes or significant lesions EYES: normal, Conjunctiva are pink and non-injected, sclera clear OROPHARYNX:no exudate, no erythema and lips, buccal mucosa, and tongue normal  NECK: supple, thyroid normal size, non-tender, without nodularity LYMPH: Palpable supraclavicular lymph node over the left side, stable LUNGS: clear to auscultation and percussion with normal breathing effort HEART: regular rate & rhythm and no murmurs and no lower extremity edema ABDOMEN:abdomen soft, non-tender and normal bowel sounds Musculoskeletal:no cyanosis of digits and no clubbing  NEURO: alert & oriented x 3 with word finding difficulties and occasional dysarthria Labs:  Recent Labs    03/09/20 0946 05/12/20 1053 06/04/20 1100 08/31/20 1022 09/15/20 1128 11/16/20 1230 12/11/20 0957 12/22/20 1927  NA 141   < > 140 137   < > 139 139 134*  K 3.9   < > 4.1 4.0   < > 3.6 3.4* 4.3  CL 106   < > 107 106   < > 102 105 98  CO2 24   < > 23 24   < > 27 26 18*  GLUCOSE 123*   < > 139* 140*   < > 187* 132* 131*  BUN 17   < > 12 14   < > 23 20 22   CREATININE 0.80   < > 0.83 0.81   < >  1.16* 1.00 1.18*  CALCIUM 10.5*   < > 10.9* 10.6*   < > 10.3 10.2 10.6*  GFRNONAA >60  --  >60 >60   < > 50* >60 49*  GFRAA >60  --  >60 >60  --   --   --   --   PROT 6.8   < > 7.1 6.9   < > 6.8 6.8 7.7  ALBUMIN 4.3   < > 4.3 4.0   < > 3.8 3.8 4.7  AST 15   < > 16 19   < > 20 20 35  ALT 17   < > 19 19   < > 22 14 18   ALKPHOS 59   < > 68 57   < > 58 48 48  BILITOT 0.5   < > 0.9 0.9   < > 0.8 1.1 1.8*   < > = values in this interval not displayed.    Studies: I have personally  reviewed the imaging study and shared with her son MR Brain W and Wo Contrast  Result Date: 12/22/2020 CLINICAL DATA:  Metastatic disease evaluation. Additional provided: Slurred speech, generalized weakness as of today. Additional history obtained from [facility] NUMBERHistory of metastatic uterine cancer. EXAM: MRI HEAD WITHOUT AND WITH CONTRAST TECHNIQUE: Multiplanar, multiecho pulse sequences of the brain and surrounding structures were obtained without and with intravenous contrast. CONTRAST:  77mL GADAVIST GADOBUTROL 1 MMOL/ML IV SOLN COMPARISON:  No pertinent prior exams available for comparison. FINDINGS: Brain: Mild intermittent motion degradation, limiting evaluation. This includes mild motion degradation of the T1 weighted postcontrast sequences. Cerebral volume is normal for age. There are multiple enhancing parental lesions likely reflecting intracranial metastatic disease, as follows. Within the left frontoparietal lobes and extending to the left temporoparietal junction, there is a centrally necrotic mass with peripheral curvilinear and nodular enhancement measuring 4.2 x 3.8 x 3.6 cm (series 15, image 39). Moderate surrounding vasogenic edema. Notably, this mass involves the left pre and postcentral gyri. Associated mass effect results in partial effacement of the atrium of the left lateral ventricle. More anteriorly within the left frontal lobe, there is a 1.4 x 1.6 x 1.6 cm lesion within the left superior frontal, and possibly middle frontal, gyri. Precontrast T1 hyperintensity limits evaluation for enhancement. Additionally, there is SWI signal loss associated with this lesion and findings likely reflect nonacute hemorrhage. Mild surrounding vasogenic edema. There is a punctate satellite lesion along its anterolateral aspect (series 15, image 45). 5 mm enhancing lesion within the cortex of the mid right frontal lobe (series 15, image 42). No significant surrounding edema. Some  restricted diffusion is associated with the metastatic lesions. However, there is no evidence of acute infarction. Background mild multifocal T2/FLAIR hyperintensity within the cerebral white matter is nonspecific, but compatible with chronic small vessel ischemic disease. There are a few punctate foci of SWI signal loss within the bilateral cerebral hemispheres compatible with nonspecific chronic microhemorrhages. No extra-axial fluid collection. No midline shift. Vascular: Expected proximal arterial flow voids. Skull and upper cervical spine: No focal marrow lesion Sinuses/Orbits: Visualized orbits show no acute finding. Mild bilateral ethmoid and maxillary sinus mucosal thickening. Other: Nonspecific 1.8 x 1.9 cm right parietal scalp lesion (series 8, image 25) (series 14, image 11). IMPRESSION: The examination is motion degraded and this may obscure small parenchymal lesions. Four parenchymal lesions are identified within the bilateral cerebral hemispheres, as described and likely reflecting intracranial metastatic disease. The largest lesion measures 4.2 x 3.8  x 3.6 cm and is located within the left frontoparietal lobes with extension to the left temporoparietal junction. Moderate surrounding vasogenic edema. Regional mass effect with partial effacement of the left lateral ventricle atrium. Notably, this dominant lesion involves the left pre and postcentral gyri. 1.8 x 1.9 cm nonspecific right parietal scalp lesion. Direct visualization is recommended. Mild cerebral white matter chronic small vessel ischemic disease. Mild paranasal sinus disease as described. Electronically Signed   By: Kellie Simmering DO   On: 12/22/2020 21:51   CT CHEST ABDOMEN PELVIS W CONTRAST  Result Date: 11/30/2020 CLINICAL DATA:  Restaging uterine cancer. Chemotherapy and radiation therapy complete. Ongoing immunotherapy. EXAM: CT CHEST, ABDOMEN, AND PELVIS WITH CONTRAST TECHNIQUE: Multidetector CT imaging of the chest, abdomen and  pelvis was performed following the standard protocol during bolus administration of intravenous contrast. CONTRAST:  159mL OMNIPAQUE IOHEXOL 300 MG/ML  SOLN COMPARISON:  CT abdomen pelvis 08/31/2020 and CT chest 03/09/2020. FINDINGS: CT CHEST FINDINGS Cardiovascular: Right IJ Port-A-Cath terminates in the low SVC. Atherosclerotic calcification of the aorta and coronary arteries. Pulmonic trunk and heart are enlarged. No pericardial effusion. Mediastinum/Nodes: Low right paratracheal lymph node measures 7 mm (2/25), stable. Left hilar lymph nodes measure up to 1.3 cm (2/26), increased from 8 mm. No axillary adenopathy. Esophagus is grossly unremarkable. Lungs/Pleura: Some pulmonary nodules have resolved in the interval. For example, a previously seen 8 mm lateral right lower lobe nodule (6/111) has resolved when compared with 08/31/2020. Other small pulmonary nodules are stable, measuring up to 6 mm in the lateral right lower lobe (6/114). No pleural fluid. Airway is unremarkable. Musculoskeletal: Postoperative changes in the proximal right humerus. Two faint areas of sclerosis in the proximal humerus (6/10) are unchanged. Mild compression of the T2 vertebral body is unchanged. CT ABDOMEN PELVIS FINDINGS Hepatobiliary: Liver is decreased in attenuation diffusely. Liver is otherwise unremarkable. Cholecystectomy. No biliary ductal dilatation. Pancreas: Negative. Spleen: Negative. Adrenals/Urinary Tract: Adrenal glands are unremarkable. Low-attenuation lesions in the kidneys measure up to 2.8 cm on the right and are indicative of cysts. A 10 mm hyperdense lesion off the lower pole right kidney (2/75) and a 1.3 cm hyperdense lesion off the lower pole left kidney appear unchanged but difficult to further characterize due to size and lack of precontrast imaging. Ureters are decompressed. Small amount of air in the bladder is presumably iatrogenic. Stomach/Bowel: Small hiatal hernia. Stomach, small bowel, appendix and  colon are otherwise unremarkable. Vascular/Lymphatic: Atherosclerotic calcification of the aorta without aneurysm. No pathologically enlarged lymph nodes. Specifically, abdominal retroperitoneal lymph nodes are subcentimeter in size, as before. Reproductive: Hysterectomy.  No adnexal mass. Other: No free fluid.  Mesenteries and peritoneum are unremarkable. Musculoskeletal: Degenerative changes in the spine. No worrisome lytic or sclerotic lesions. Degenerative changes in the hips. Mild levoconvex scoliosis of the lumbar spine. IMPRESSION: 1. Improving pulmonary metastatic disease. 2. Left hilar lymph nodes have increased in size slightly in the interval. Subcentimeter abdominal retroperitoneal lymph nodes are stable. 3. Hyperattenuating lesions in the inferior aspects of both kidneys are too small to characterize. Further evaluation with pre and post contrast MRI should be considered. Pre and post contrast CT could alternatively be performed, but would likely be of decreased accuracy given lesion size. 4. Hepatic steatosis. 5. Aortic atherosclerosis (ICD10-I70.0). Coronary artery calcification. 6. Enlarged pulmonic trunk, indicative of pulmonary arterial hypertension. Electronically Signed   By: Lorin Picket M.D.   On: 11/30/2020 11:21

## 2020-12-23 NOTE — ED Notes (Signed)
Pt assisted to bedside toilet and back to bed.

## 2020-12-24 ENCOUNTER — Other Ambulatory Visit: Payer: Self-pay | Admitting: Neurosurgery

## 2020-12-24 ENCOUNTER — Ambulatory Visit
Admit: 2020-12-24 | Discharge: 2020-12-24 | Disposition: A | Discharge status: Home / Self Care | Payer: Medicare Other | Attending: Radiation Oncology | Admitting: Radiation Oncology

## 2020-12-24 ENCOUNTER — Inpatient Hospital Stay (HOSPITAL_COMMUNITY): Payer: Medicare Other

## 2020-12-24 ENCOUNTER — Other Ambulatory Visit: Payer: Self-pay

## 2020-12-24 DIAGNOSIS — E119 Type 2 diabetes mellitus without complications: Secondary | ICD-10-CM | POA: Diagnosis not present

## 2020-12-24 DIAGNOSIS — C7931 Secondary malignant neoplasm of brain: Secondary | ICD-10-CM | POA: Diagnosis not present

## 2020-12-24 DIAGNOSIS — C55 Malignant neoplasm of uterus, part unspecified: Secondary | ICD-10-CM | POA: Diagnosis not present

## 2020-12-24 LAB — CBC
HCT: 36.9 % (ref 36.0–46.0)
Hemoglobin: 13.3 g/dL (ref 12.0–15.0)
MCH: 32.7 pg (ref 26.0–34.0)
MCHC: 36 g/dL (ref 30.0–36.0)
MCV: 90.7 fL (ref 80.0–100.0)
Platelets: 205 10*3/uL (ref 150–400)
RBC: 4.07 MIL/uL (ref 3.87–5.11)
RDW: 13.8 % (ref 11.5–15.5)
WBC: 7.5 10*3/uL (ref 4.0–10.5)
nRBC: 0 % (ref 0.0–0.2)

## 2020-12-24 LAB — GLUCOSE, CAPILLARY
Glucose-Capillary: 169 mg/dL — ABNORMAL HIGH (ref 70–99)
Glucose-Capillary: 190 mg/dL — ABNORMAL HIGH (ref 70–99)
Glucose-Capillary: 285 mg/dL — ABNORMAL HIGH (ref 70–99)
Glucose-Capillary: 235 mg/dL — ABNORMAL HIGH (ref 70–99)

## 2020-12-24 LAB — BASIC METABOLIC PANEL
Anion gap: 11 (ref 5–15)
BUN: 26 mg/dL — ABNORMAL HIGH (ref 8–23)
CO2: 23 mmol/L (ref 22–32)
Calcium: 9.8 mg/dL (ref 8.9–10.3)
Chloride: 103 mmol/L (ref 98–111)
Creatinine, Ser: 1.13 mg/dL — ABNORMAL HIGH (ref 0.44–1.00)
GFR, Estimated: 52 mL/min — ABNORMAL LOW (ref >60)
Glucose, Bld: 167 mg/dL — ABNORMAL HIGH (ref 70–99)
Potassium: 3.2 mmol/L — ABNORMAL LOW (ref 3.5–5.1)
Sodium: 137 mmol/L (ref 135–145)

## 2020-12-24 MED ORDER — CHLORHEXIDINE GLUCONATE CLOTH 2 % EX PADS
6.0000 | MEDICATED_PAD | Freq: Every day | CUTANEOUS | Status: DC
  Administered 2020-12-24 – 2020-12-25 (×2): 6 via TOPICAL

## 2020-12-24 MED ORDER — LOPERAMIDE HCL 2 MG PO CAPS
4.0000 mg | ORAL_CAPSULE | Freq: Every day | ORAL | Status: DC
  Administered 2020-12-24 – 2020-12-25 (×2): 4 mg via ORAL
  Filled 2020-12-24 (×2): qty 2

## 2020-12-24 MED ORDER — GADOBUTROL 1 MMOL/ML IV SOLN
2.0000 mL | Freq: Once | INTRAVENOUS | Status: AC | PRN
  Administered 2020-12-24: 2 mL via INTRAVENOUS

## 2020-12-24 MED ORDER — ENSURE ENLIVE PO LIQD
237.0000 mL | Freq: Three times a day (TID) | ORAL | Status: DC
  Administered 2020-12-25 (×2): 237 mL via ORAL

## 2020-12-24 MED ORDER — ADULT MULTIVITAMIN W/MINERALS CH
1.0000 | ORAL_TABLET | Freq: Every day | ORAL | Status: DC
  Administered 2020-12-25: 1 via ORAL
  Filled 2020-12-24: qty 1

## 2020-12-24 MED ORDER — POTASSIUM CHLORIDE CRYS ER 20 MEQ PO TBCR
40.0000 meq | EXTENDED_RELEASE_TABLET | ORAL | Status: AC
  Administered 2020-12-24 (×2): 40 meq via ORAL
  Filled 2020-12-24 (×2): qty 2

## 2020-12-24 MED ORDER — LOPERAMIDE HCL 2 MG PO CAPS: 4.0000 mg | ORAL_CAPSULE | ORAL | Status: DC | PRN

## 2020-12-24 NOTE — Progress Notes (Signed)
Spoke with Sian RN at [facility] to inform her that the patient is scheduled for a Radiation Simulation today at 2:30 pm.  I asked her to call and set up carelink to have the patient here at 2:15 pm.  She verbalized understanding.  Call back [phone] with any questions or concerns.  Will continue to follow as necessary.  Gloriajean Dell. Leonie Green, BSN

## 2020-12-24 NOTE — Progress Notes (Signed)
Patient transported by carelink to [facility] for radiation.

## 2020-12-24 NOTE — Progress Notes (Signed)
I called the patient and the left a voicemail for the patient's sister to let them know that Dr. Alvy Bimler has weighed in and the recommendations are to hold her oral VEGF inhibitor for 1 week prior to a scheduled surgery. Fortunately her dosing of this was last given yesterday. The patient will go for her 3T MRI, and is in agreement with this plan. She will come to our office via carelink this afternoon for simulation.    Carola Rhine, PAC

## 2020-12-24 NOTE — Progress Notes (Signed)
Initial Nutrition Assessment  DOCUMENTATION CODES:   Not applicable  INTERVENTION:   Ensure Enlive po TID, each supplement provides 350 kcal and 20 grams of protein  Magic cup TID with meals, each supplement provides 290 kcal and 9 grams of protein  MVI with minerals daily   NUTRITION DIAGNOSIS:   Increased nutrient needs related to cancer and cancer related treatments as evidenced by estimated needs.    GOAL:   Patient will meet greater than or equal to 90% of their needs    MONITOR:   PO intake,Supplement acceptance,Weight trends,Labs,I & O's  REASON FOR ASSESSMENT:   Malnutrition Screening Tool    ASSESSMENT:   Pt with stage IV uterine cancer with mets to lung admitted with new intracranial mets. PMH includes HTN, DM, hyperparathyroidism.   Pt unavailable at time of RD visit. Pt to have palliative radiation therapy.   PO Intake: 50% x 1 recorded meal  No UOP documented.   Labs: K+ 3.2 (L), CBGs [phone] Medications: decadron, pepcid, ss novolog TID w/ meals and at bedtime, imodium  Diet Order:   Diet Order            Diet Carb Modified Fluid consistency: Thin; Room service appropriate? Yes  Diet effective now                 EDUCATION NEEDS:   No education needs have been identified at this time  Skin:  Skin Assessment: Reviewed RN Assessment  Last BM:  PTA  Height:   Ht Readings from Last 1 Encounters:  12/23/20 5\' 2"  (1.575 m)    Weight:   Wt Readings from Last 1 Encounters:  12/23/20 68.9 kg    BMI:  Body mass index is 27.78 kg/m.  Estimated Nutritional Needs:   Kcal:  1700-1900  Protein:  85-95 grams  Fluid:  >1.7L/d   Larkin Ina, MS, RD, LDN RD pager number and weekend/on-call pager number located in [place].

## 2020-12-24 NOTE — Progress Notes (Addendum)
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 Unfortunately, she has new neurological symptoms and MRI confirmed intracranial metastasis The patient is symptomatic I recommend continue high-dose dexamethasone for now; she has shown improvement For discharge, I recommend reducing dexamethasone to 3 times daily MRI with SRS protocol has been ordered by radiation oncology and pending Radiation oncology has been consulted with plans for simulation later today for SRS prior to craniotomy I have discontinued her current chemotherapy medication, Lenvima I recommend continue Keppra for seizure prophylaxis Continue aggressive supportive care for now  Diabetes She would be at risk of severe hyperglycemia on steroids If her neurological symptoms improve, we can start dexamethasone taper She will need close monitoring of her blood sugar and insulin as needed  Hypercalcemia She is known to have chronic hypercalcemia Observe closely for now  Heartburn Likely exacerbated by steroids I have started her on Protonix and Pepcid  Weakness and slurring of speech We will consult PT OT in the hospital  Goals of care Stabilization of neurological deficit prior to discharge  Discharge planning Overall, she is dramatically improved I felt that she should be able to be discharged tomorrow I have scheduled virtual visit tomorrow evening to have family meeting at home at 5 PM, December 25, 2020 I will sign off Please call if questions arise  All questions were answered. The patient knows to call the clinic with any problems, questions or concerns.   Mikey Bussing, NP 12/24/2020 9:57 AM Heath Lark, MD  Subjective:  The patient reports that she is feeling better today Speech changes have improved Denies headaches and the  twitching of her right eye has resolved No seizures reported The patient has been seen by radiation oncology with plans for simulation for SRS to the brain lesion followed by craniotomy next week The patient is requesting to have 4 mg of Imodium scheduled daily to help control loose stools  Objective:  Vitals:   12/24/20 0321 12/24/20 0700  BP: (!) 98/58 (!) 115/59  Pulse: (!) 55 (!) 55  Resp: 17 16  Temp: 97.9 F (36.6 C) 97.6 F (36.4 C)  SpO2: 94% 90%     Intake/Output Summary (Last 24 hours) at 12/24/2020 0957 Last data filed at 12/24/2020 0900 Gross per 24 hour  Intake 260 ml  Output --  Net 260 ml    GENERAL:alert, no distress and comfortable SKIN: skin color, texture, turgor are normal, no rashes or significant lesions EYES: normal, Conjunctiva are pink and non-injected, sclera clear OROPHARYNX:no exudate, no erythema and lips, buccal mucosa, and tongue normal  NECK: supple, thyroid normal size, non-tender, without nodularity LYMPH: Palpable supraclavicular lymph node over the left side, stable LUNGS: clear to auscultation and percussion with normal breathing effort HEART: regular rate & rhythm and no murmurs and no lower extremity edema ABDOMEN:abdomen soft, non-tender and normal bowel sounds Musculoskeletal:no cyanosis of digits and no clubbing  NEURO: alert & oriented x 3, no difficulty with word finding today  Labs:  Recent Labs    03/09/20 0946 05/12/20 1053 06/04/20 1100 08/31/20 1022 09/15/20 1128 11/16/20 1230 12/11/20 0957 12/22/20 1927 12/24/20 0249  NA 141   < > 140 137   < > 139 139 134* 137  K 3.9   < > 4.1 4.0   < > 3.6 3.4*  4.3 3.2*  CL 106   < > 107 106   < > 102 105 98 103  CO2 24   < > 23 24   < > 27 26 18* 23  GLUCOSE 123*   < > 139* 140*   < > 187* 132* 131* 167*  BUN 17   < > 12 14   < > 23 20 22  26*  CREATININE 0.80   < > 0.83 0.81   < > 1.16* 1.00 1.18* 1.13*  CALCIUM 10.5*   < > 10.9* 10.6*   < > 10.3 10.2 10.6* 9.8  GFRNONAA >60   --  >60 >60   < > 50* >60 49* 52*  GFRAA >60  --  >60 >60  --   --   --   --   --   PROT 6.8   < > 7.1 6.9   < > 6.8 6.8 7.7  --   ALBUMIN 4.3   < > 4.3 4.0   < > 3.8 3.8 4.7  --   AST 15   < > 16 19   < > 20 20 35  --   ALT 17   < > 19 19   < > 22 14 18   --   ALKPHOS 59   < > 68 57   < > 58 48 48  --   BILITOT 0.5   < > 0.9 0.9   < > 0.8 1.1 1.8*  --    < > = values in this interval not displayed.    Studies: I have personally reviewed the imaging study and shared with her son MR Brain W and Wo Contrast  Result Date: 12/22/2020 CLINICAL DATA:  Metastatic disease evaluation. Additional provided: Slurred speech, generalized weakness as of today. Additional history obtained from [place] NUMBERHistory of metastatic uterine cancer. EXAM: MRI HEAD WITHOUT AND WITH CONTRAST TECHNIQUE: Multiplanar, multiecho pulse sequences of the brain and surrounding structures were obtained without and with intravenous contrast. CONTRAST:  24mL GADAVIST GADOBUTROL 1 MMOL/ML IV SOLN COMPARISON:  No pertinent prior exams available for comparison. FINDINGS: Brain: Mild intermittent motion degradation, limiting evaluation. This includes mild motion degradation of the T1 weighted postcontrast sequences. Cerebral volume is normal for age. There are multiple enhancing parental lesions likely reflecting intracranial metastatic disease, as follows. Within the left frontoparietal lobes and extending to the left temporoparietal junction, there is a centrally necrotic mass with peripheral curvilinear and nodular enhancement measuring 4.2 x 3.8 x 3.6 cm (series 15, image 39). Moderate surrounding vasogenic edema. Notably, this mass involves the left pre and postcentral gyri. Associated mass effect results in partial effacement of the atrium of the left lateral ventricle. More anteriorly within the left frontal lobe, there is a 1.4 x 1.6 x 1.6 cm lesion within the left superior frontal, and possibly middle frontal, gyri.  Precontrast T1 hyperintensity limits evaluation for enhancement. Additionally, there is SWI signal loss associated with this lesion and findings likely reflect nonacute hemorrhage. Mild surrounding vasogenic edema. There is a punctate satellite lesion along its anterolateral aspect (series 15, image 45). 5 mm enhancing lesion within the cortex of the mid right frontal lobe (series 15, image 42). No significant surrounding edema. Some restricted diffusion is associated with the metastatic lesions. However, there is no evidence of acute infarction. Background mild multifocal T2/FLAIR hyperintensity within the cerebral white matter is nonspecific, but compatible with chronic small vessel ischemic disease. There are a few punctate foci  of SWI signal loss within the bilateral cerebral hemispheres compatible with nonspecific chronic microhemorrhages. No extra-axial fluid collection. No midline shift. Vascular: Expected proximal arterial flow voids. Skull and upper cervical spine: No focal marrow lesion Sinuses/Orbits: Visualized orbits show no acute finding. Mild bilateral ethmoid and maxillary sinus mucosal thickening. Other: Nonspecific 1.8 x 1.9 cm right parietal scalp lesion (series 8, image 25) (series 14, image 11). IMPRESSION: The examination is motion degraded and this may obscure small parenchymal lesions. Four parenchymal lesions are identified within the bilateral cerebral hemispheres, as described and likely reflecting intracranial metastatic disease. The largest lesion measures 4.2 x 3.8 x 3.6 cm and is located within the left frontoparietal lobes with extension to the left temporoparietal junction. Moderate surrounding vasogenic edema. Regional mass effect with partial effacement of the left lateral ventricle atrium. Notably, this dominant lesion involves the left pre and postcentral gyri. 1.8 x 1.9 cm nonspecific right parietal scalp lesion. Direct visualization is recommended. Mild cerebral white matter  chronic small vessel ischemic disease. Mild paranasal sinus disease as described. Electronically Signed   By: Kellie Simmering DO   On: 12/22/2020 21:51   CT CHEST ABDOMEN PELVIS W CONTRAST  Result Date: 11/30/2020 CLINICAL DATA:  Restaging uterine cancer. Chemotherapy and radiation therapy complete. Ongoing immunotherapy. EXAM: CT CHEST, ABDOMEN, AND PELVIS WITH CONTRAST TECHNIQUE: Multidetector CT imaging of the chest, abdomen and pelvis was performed following the standard protocol during bolus administration of intravenous contrast. CONTRAST:  118mL OMNIPAQUE IOHEXOL 300 MG/ML  SOLN COMPARISON:  CT abdomen pelvis 08/31/2020 and CT chest 03/09/2020. FINDINGS: CT CHEST FINDINGS Cardiovascular: Right IJ Port-A-Cath terminates in the low SVC. Atherosclerotic calcification of the aorta and coronary arteries. Pulmonic trunk and heart are enlarged. No pericardial effusion. Mediastinum/Nodes: Low right paratracheal lymph node measures 7 mm (2/25), stable. Left hilar lymph nodes measure up to 1.3 cm (2/26), increased from 8 mm. No axillary adenopathy. Esophagus is grossly unremarkable. Lungs/Pleura: Some pulmonary nodules have resolved in the interval. For example, a previously seen 8 mm lateral right lower lobe nodule (6/111) has resolved when compared with 08/31/2020. Other small pulmonary nodules are stable, measuring up to 6 mm in the lateral right lower lobe (6/114). No pleural fluid. Airway is unremarkable. Musculoskeletal: Postoperative changes in the proximal right humerus. Two faint areas of sclerosis in the proximal humerus (6/10) are unchanged. Mild compression of the T2 vertebral body is unchanged. CT ABDOMEN PELVIS FINDINGS Hepatobiliary: Liver is decreased in attenuation diffusely. Liver is otherwise unremarkable. Cholecystectomy. No biliary ductal dilatation. Pancreas: Negative. Spleen: Negative. Adrenals/Urinary Tract: Adrenal glands are unremarkable. Low-attenuation lesions in the kidneys measure up to  2.8 cm on the right and are indicative of cysts. A 10 mm hyperdense lesion off the lower pole right kidney (2/75) and a 1.3 cm hyperdense lesion off the lower pole left kidney appear unchanged but difficult to further characterize due to size and lack of precontrast imaging. Ureters are decompressed. Small amount of air in the bladder is presumably iatrogenic. Stomach/Bowel: Small hiatal hernia. Stomach, small bowel, appendix and colon are otherwise unremarkable. Vascular/Lymphatic: Atherosclerotic calcification of the aorta without aneurysm. No pathologically enlarged lymph nodes. Specifically, abdominal retroperitoneal lymph nodes are subcentimeter in size, as before. Reproductive: Hysterectomy.  No adnexal mass. Other: No free fluid.  Mesenteries and peritoneum are unremarkable. Musculoskeletal: Degenerative changes in the spine. No worrisome lytic or sclerotic lesions. Degenerative changes in the hips. Mild levoconvex scoliosis of the lumbar spine. IMPRESSION: 1. Improving pulmonary metastatic disease. 2. Left hilar lymph  nodes have increased in size slightly in the interval. Subcentimeter abdominal retroperitoneal lymph nodes are stable. 3. Hyperattenuating lesions in the inferior aspects of both kidneys are too small to characterize. Further evaluation with pre and post contrast MRI should be considered. Pre and post contrast CT could alternatively be performed, but would likely be of decreased accuracy given lesion size. 4. Hepatic steatosis. 5. Aortic atherosclerosis (ICD10-I70.0). Coronary artery calcification. 6. Enlarged pulmonic trunk, indicative of pulmonary arterial hypertension. Electronically Signed   By: Lorin Picket M.D.   On: 11/30/2020 11:21

## 2020-12-24 NOTE — Plan of Care (Signed)
  Problem: Education: Goal: Knowledge of General Education information will improve Description: Including pain rating scale, medication(s)/side effects and non-pharmacologic comfort measures Outcome: Progressing   Problem: Health Behavior/Discharge Planning: Goal: Ability to manage health-related needs will improve Outcome: Progressing   Problem: Clinical Measurements: Goal: Ability to maintain clinical measurements within normal limits will improve Outcome: Progressing Goal: Will remain free from infection Outcome: Progressing Goal: Diagnostic test results will improve Outcome: Progressing Goal: Respiratory complications will improve Outcome: Progressing Goal: Cardiovascular complication will be avoided Outcome: Progressing   Problem: Activity: Goal: Risk for activity intolerance will decrease Outcome: Progressing   Problem: Nutrition: Goal: Adequate nutrition will be maintained Outcome: Progressing   Problem: Coping: Goal: Level of anxiety will decrease Outcome: Progressing   Problem: Elimination: Goal: Will not experience complications related to bowel motility Outcome: Progressing Goal: Will not experience complications related to urinary retention Outcome: Progressing   Problem: Pain Managment: Goal: General experience of comfort will improve Outcome: Progressing   Problem: Safety: Goal: Ability to remain free from injury will improve Outcome: Progressing   Problem: Skin Integrity: Goal: Risk for impaired skin integrity will decrease Outcome: Progressing   Problem: Education: Goal: Knowledge of the prescribed therapeutic regimen will improve Outcome: Progressing   Problem: Activity: Goal: Ability to implement measures to reduce episodes of fatigue will improve Outcome: Progressing   Problem: Bowel/Gastric: Goal: Will not experience complications related to bowel motility Outcome: Progressing   Problem: Coping: Goal: Ability to identify and develop  effective coping behavior will improve Outcome: Progressing   Problem: Nutritional: Goal: Maintenance of adequate nutrition will improve Outcome: Progressing   

## 2020-12-24 NOTE — Progress Notes (Signed)
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 Work-up revealed intracranial metastases with some mass-effect. Patient was admitted for palliative radiation therapy, steroids and seizure prophylaxis with Keppra.  Subjective:  Patient states she is feeling better.  States that her speech is improved although is not back to baseline.  She notes she had significant pain in her right lower extremity which is now resolved.  Believes that she is thinking clearly.  Denies headache.   Objective: Vitals:   12/23/20 1927 12/23/20 2329 12/24/20 0321 12/24/20 0700  BP: 131/63 (!) 103/52 (!) 98/58 (!) 115/59  Pulse: (!) 56 (!) 56 (!) 55 (!) 55  Resp: 18 17 17 16   Temp: 98.2 F (36.8 C) 97.9 F (36.6 C) 97.9 F (36.6 C) 97.6 F (36.4 C)  TempSrc: Oral Oral Oral Oral  SpO2: 96% 94% 94% 90%  Weight:  68.9 kg    Height:  5\' 2"  (1.575 m)      Intake/Output Summary (Last 24 hours) at 12/24/2020 1420 Last data filed at 12/24/2020 0900 Gross per 24 hour  Intake 260 ml  Output --  Net 260 ml   Filed Weights   12/23/20 2329  Weight: 68.9 kg     Exam:  General: Chronically ill-appearing female lying in bed in no acute distress.  She is enunciating her words very clearly and has rare dysarthria. Eyes: sclera anicteric, conjuctiva mild injection bilaterally CVS: S1-S2, regular  Respiratory:  decreased air entry bilaterally secondary to decreased inspiratory effort, rales at bases  GI: NABS, soft, NT  LE: No edema.  Neuro: She has some dysarthria as noted earlier.  Psych: patient is logical and coherent, judgement and insight appear normal, mood and affect appropriate to situation.   Assessment & Plan:    Unfortunate 70 year old female is diagnosed with new intracranial metastases from known stage IV uterine CA.  Metastatic uterine cancer with brain mets. Dr. Alvy Bimler remains closely involved with patient and family  Continue dexamethasone Continue Keppra for seizure prophylaxis Radiation therapy are involved for palliation Patient is already improved with addition of dexamethasone yesterday PT and OT and SLP consults placed  Hypokalemia We will replete and recheck  DM2 Blood sugars under reasonable control on present regimen  GERD Continue Protonix and Pepcid per Dr. Alvy Bimler  HTN Continue Toprol-XL, amlodipine, losartan and hydralazine per home doses  Hypothyroidism Continue Synthroid    DVT prophylaxis: SCD Code Status: Full Family Communication: Dr. Alvy Bimler discussed findings with family at length Disposition Plan:   Patient is from: Home  Anticipated Discharge Location: Home  Barriers to Discharge: Getting set up for palliative radiation therapy  Is patient medically stable for Discharge: No, plan is for discharge around 1/29-2/2   Consultants:  Medical oncology  Radiation oncology  Procedures:  Plan for radiation tomorrow  Antimicrobials:  None   Data Reviewed:  Basic Metabolic Panel: Recent Labs  Lab 12/22/20 1927 12/24/20 0249  NA 134* 137  K 4.3 3.2*  CL 98 103  CO2 18* 23  GLUCOSE 131* 167*  BUN 22 26*  CREATININE 1.18* 1.13*  CALCIUM 10.6* 9.8   Liver Function Tests: Recent Labs  Lab 12/22/20 1927  AST 35  ALT 18  ALKPHOS 48  BILITOT 1.8*  PROT 7.7  ALBUMIN 4.7   No results for input(s): LIPASE, AMYLASE in the last 168 hours. No results for input(s): AMMONIA in the last 168 hours. CBC: Recent Labs  Lab 12/22/20 1927 12/24/20 0249  WBC 6.7 7.5  NEUTROABS 3.7  --   HGB 15.3* 13.3  HCT 44.0 36.9  MCV 92.1 90.7  PLT 215 205   Cardiac Enzymes: No results for input(s): CKTOTAL, CKMB, CKMBINDEX, TROPONINI in the last 168  hours. BNP (last 3 results) No results for input(s): PROBNP in the last 8760 hours. CBG: Recent Labs  Lab 12/23/20 1104 12/23/20 1623 12/23/20 2148 12/24/20 0647 12/24/20 1139  GLUCAP 246* 209* 191* 169* 190*    Recent Results (from the past 240 hour(s))  SARS Coronavirus 2 by RT PCR (hospital order, performed in [facility] hospital lab) Nasopharyngeal Nasopharyngeal Swab     Status: None   Collection Time: 12/22/20  9:10 PM   Specimen: Nasopharyngeal Swab  Result Value Ref Range Status   SARS Coronavirus 2 NEGATIVE NEGATIVE Final    Comment: (NOTE) SARS-CoV-2 target nucleic acids are NOT DETECTED.  The SARS-CoV-2 RNA is generally detectable in upper and lower respiratory specimens during the acute phase of infection. The lowest concentration of SARS-CoV-2 viral copies this assay can detect is 250 copies / mL. A negative result does not preclude SARS-CoV-2 infection and should not be used as the sole basis for treatment or other patient management decisions.  A negative result may occur with improper specimen collection / handling, submission of specimen other than nasopharyngeal swab, presence of viral mutation(s) within the areas targeted by this assay, and inadequate number of viral copies (<250 copies / mL). A negative result must be combined with clinical observations, patient history, and epidemiological information.  Fact Sheet for Patients:   StrictlyIdeas.no  Fact Sheet for Healthcare Providers: BankingDealers.co.za  This test is not yet approved or  cleared by the [place] FDA and has been authorized for detection and/or diagnosis of SARS-CoV-2 by FDA under an Emergency Use Authorization (EUA).  This EUA will remain in effect (meaning this test can be used) for the duration of the COVID-19 declaration under Section 564(b)(1) of the Act, 21 U.S.C. section 360bbb-3(b)(1), unless the authorization is terminated  or revoked sooner.  Performed at [facility], [place] [address], [place], [place] [address]       Studies: MR BRAIN W WO CONTRAST  Result Date: 12/24/2020 CLINICAL DATA:  Brain/CNS neoplasm, staging. EXAM: MRI HEAD WITHOUT AND WITH CONTRAST TECHNIQUE: Multiplanar, multiecho pulse sequences of the brain and surrounding structures were obtained without and with intravenous contrast. CONTRAST:  48mL GADAVIST GADOBUTROL 1 MMOL/ML IV SOLN COMPARISON:  Brain MRI 12/22/2020. FINDINGS: Brain: Cerebral volume is normal for age. 4.2 x 3.8 x 3.8 cm centrally necrotic mass with peripheral curvilinear and nodular enhancement within the left frontoparietal lobes and extending to the left temporoparietal junction, not significantly changed in size from the brain MRI of 12/22/2020 (series 9, image 142). Unchanged moderate surrounding vasogenic edema. Unchanged mass effect with effacement of the left lateral ventricle atrium. Notably, this mass involves the left pre and postcentral gyri. Anterior to this, there is a 1.4 x 1.6 x 1.7 cm mass within the left frontal lobe which has not significantly changed in size (series 9, image 164). This mass involves the left superior and possibly middle frontal gyri. As before, this mass demonstrates precontrast T1 hyperintensity and SWI signal loss consistent with nonacute hemorrhage. Precontrast T1 hyperintensity  limits evaluation for enhancement. Mild surrounding vasogenic edema, unchanged.The previously questioned satellite lesion along the left anterolateral aspect appears to reflect vascular enhancement. Unchanged 5 mm enhancing cortical lesion within the mid right frontal lobe (series 9, image 153). Corresponding precontrast T1 hyperintensity and SWI signal loss at this site consistent with nonacute blood products. No new intracranial metastasis is identified. As before, there is some restricted diffusion associated with the lesions. However, there is no  evidence of acute infarction. Background mild multifocal T2/FLAIR hyperintensity within the cerebral white matter is nonspecific, but compatible with chronic small vessel ischemic disease. No extra-axial fluid collection. No midline shift. Vascular: Expected proximal arterial flow voids. Non dominant intracranial left vertebral artery. Right temporal lobe developmental venous anomaly. Skull and upper cervical spine: No focal marrow lesion. Sinuses/Orbits: Visualized orbits show no acute finding. Small mucous retention cysts and mild mucosal thickening within the bilateral maxillary sinuses. Other: Nonspecific 8 mm enhancing lesion within the right temporal scalp (series 9, image 139). Nonspecific 1.8 x 1.9 cm enhancing lesion within the right parietal scalp (series 9, image 188). IMPRESSION: 3 parenchymal lesions within the bilateral cerebral hemispheres are unchanged from the brain MRI of 12/22/2020 and likely reflect metastases. The largest lesion measures 4.2 x 3.8 x 3.6 cm and is located within the left frontoparietal lobes with extension to the left temporoparietal junction. Unchanged moderate surrounding vasogenic edema with regional mass effect and partial effacement of the left lateral ventricle atrium. Notably, this dominant lesion balls the left pre and postcentral gyri. A previously questioned additional punctate metastasis within the left frontal lobe appears to reflect vascular enhancement. Nonspecific enhancing lesions measuring 8 mm within the right temporal scalp and measuring 1.8 x 1.9 cm within the right parietal scalp. Tumor deposits cannot be excluded. Direct visualization recommended. Additionally, direct tissue sampling can be considered. Electronically Signed   By: Kellie Simmering DO   On: 12/24/2020 13:37   MR Brain W and Wo Contrast  Result Date: 12/22/2020 CLINICAL DATA:  Metastatic disease evaluation. Additional provided: Slurred speech, generalized weakness as of today. Additional  history obtained from [place] [facility]BERHistory of metastatic uterine cancer. EXAM: MRI HEAD WITHOUT AND WITH CONTRAST TECHNIQUE: Multiplanar, multiecho pulse sequences of the brain and surrounding structures were obtained without and with intravenous contrast. CONTRAST:  79mL GADAVIST GADOBUTROL 1 MMOL/ML IV SOLN COMPARISON:  No pertinent prior exams available for comparison. FINDINGS: Brain: Mild intermittent motion degradation, limiting evaluation. This includes mild motion degradation of the T1 weighted postcontrast sequences. Cerebral volume is normal for age. There are multiple enhancing parental lesions likely reflecting intracranial metastatic disease, as follows. Within the left frontoparietal lobes and extending to the left temporoparietal junction, there is a centrally necrotic mass with peripheral curvilinear and nodular enhancement measuring 4.2 x 3.8 x 3.6 cm (series 15, image 39). Moderate surrounding vasogenic edema. Notably, this mass involves the left pre and postcentral gyri. Associated mass effect results in partial effacement of the atrium of the left lateral ventricle. More anteriorly within the left frontal lobe, there is a 1.4 x 1.6 x 1.6 cm lesion within the left superior frontal, and possibly middle frontal, gyri. Precontrast T1 hyperintensity limits evaluation for enhancement. Additionally, there is SWI signal loss associated with this lesion and findings likely reflect nonacute hemorrhage. Mild surrounding vasogenic edema. There is a punctate satellite lesion along its anterolateral aspect (series 15, image 45). 5 mm enhancing lesion within the cortex of the mid right frontal lobe (series 15, image 42). No significant  surrounding edema. Some restricted diffusion is associated with the metastatic lesions. However, there is no evidence of acute infarction. Background mild multifocal T2/FLAIR hyperintensity within the cerebral white matter is nonspecific, but compatible with chronic  small vessel ischemic disease. There are a few punctate foci of SWI signal loss within the bilateral cerebral hemispheres compatible with nonspecific chronic microhemorrhages. No extra-axial fluid collection. No midline shift. Vascular: Expected proximal arterial flow voids. Skull and upper cervical spine: No focal marrow lesion Sinuses/Orbits: Visualized orbits show no acute finding. Mild bilateral ethmoid and maxillary sinus mucosal thickening. Other: Nonspecific 1.8 x 1.9 cm right parietal scalp lesion (series 8, image 25) (series 14, image 11). IMPRESSION: The examination is motion degraded and this may obscure small parenchymal lesions. Four parenchymal lesions are identified within the bilateral cerebral hemispheres, as described and likely reflecting intracranial metastatic disease. The largest lesion measures 4.2 x 3.8 x 3.6 cm and is located within the left frontoparietal lobes with extension to the left temporoparietal junction. Moderate surrounding vasogenic edema. Regional mass effect with partial effacement of the left lateral ventricle atrium. Notably, this dominant lesion involves the left pre and postcentral gyri. 1.8 x 1.9 cm nonspecific right parietal scalp lesion. Direct visualization is recommended. Mild cerebral white matter chronic small vessel ischemic disease. Mild paranasal sinus disease as described. Electronically Signed   By: Kellie Simmering DO   On: 12/22/2020 21:51     Scheduled Meds: . amLODipine  10 mg Oral Daily  . aspirin EC  81 mg Oral Daily  . Chlorhexidine Gluconate Cloth  6 each Topical Daily  . dexamethasone  4 mg Oral Q6H  . famotidine  20 mg Oral BID  . hydrALAZINE  25 mg Oral TID  . insulin aspart  0-15 Units Subcutaneous TID WC  . insulin aspart  0-5 Units Subcutaneous QHS  . levETIRAcetam  500 mg Oral BID  . levothyroxine  25 mcg Oral Q0600  . loperamide  4 mg Oral Daily  . losartan  100 mg Oral Daily  . metoprolol succinate  100 mg Oral Daily  .  pantoprazole  40 mg Oral Daily  . rosuvastatin  10 mg Oral Daily  . terazosin  2 mg Oral QHS   Continuous Infusions:  Principal Problem:   Metastasis to brain [facility]) Active Problems:   Uterine cancer ([place])   Diabetes mellitus without complication, without long-term current use of insulin ([place])   Essential hypertension   Brain metastasis ([place])     Sandra Arroyo Derek Jack, [facility]  If 7PM-7AM, please contact night-coverage [email or web address] Password [email or web address] 12/24/2020, 2:20 PM    LOS: 1 day

## 2020-12-24 NOTE — Evaluation (Signed)
Physical Therapy Evaluation Patient Details Name: Felcia Huebert MRN: [id number] DOB: 01/07/1951 Today's Date: 12/24/2020   History of Present Illness  70 year old female with known stage IV uterine cancer with mets lung was admitted 12/23/2020 was seen for difficulty with speech, blurry vision, and R sided pain. Work-up revealed intracranial metastases with some mass-effect. Patient was admitted for palliative radiation therapy, steroids and seizure prophylaxis with Keppra.  Clinical Impression   Pt presents with generalized weakness, poor RUE awareness and coordination, impaired dynamic standing balance with DGI score 16/24 indicating fall risk, and decreased activity tolerance vs baseline. Pt to benefit from acute PT to address deficits. Pt ambulated hallway distance with close guard and occasional min assist to steady, per pt she feels weaker than baseline. Pt expresses her husband can assist her with mobility at d/c, and she wishes to start OPPT after surgery next week. PT to progress mobility as tolerated, and will continue to follow acutely.      Follow Up Recommendations Outpatient PT (post-surgery next week)    Equipment Recommendations  None recommended by PT (encouraged pt to get a shower seat)    Recommendations for Other Services       Precautions / Restrictions Precautions Precautions: Fall Restrictions Weight Bearing Restrictions: No      Mobility  Bed Mobility Overal bed mobility: Needs Assistance Bed Mobility: Supine to Sit;Sit to Supine     Supine to sit: Supervision Sit to supine: Supervision   General bed mobility comments: for safety, increased time.    Transfers Overall transfer level: Needs assistance Equipment used: None Transfers: Sit to/from Stand Sit to Stand: Min guard         General transfer comment: for safety, STS x2 from EOB and toilet.  Ambulation/Gait Ambulation/Gait assistance: Min guard;Min assist Gait Distance (Feet): 250  Feet Assistive device: None Gait Pattern/deviations: Step-through pattern;Decreased stride length;Drifts right/left Gait velocity: decr   General Gait Details: min guard for safety, occasional min assist to steady and guide pt away from objects on R. lateral unsteadiness  Stairs Stairs: Yes Stairs assistance: Min guard Stair Management: Two rails;Alternating pattern;Forwards Number of Stairs: 3 (x2) General stair comments: min guard for safety, good speed and sequencing. R hand dragging into rail x1.  Wheelchair Mobility    Modified Rankin (Stroke Patients Only)       Balance Overall balance assessment: Needs assistance Sitting-balance support: No upper extremity supported;Feet supported Sitting balance-Leahy Scale: Good     Standing balance support: No upper extremity supported;During functional activity Standing balance-Leahy Scale: Fair Standing balance comment: mildly unsteady                 Standardized Balance Assessment Standardized Balance Assessment : Dynamic Gait Index   Dynamic Gait Index Level Surface: Mild Impairment Change in Gait Speed: Mild Impairment Gait with Horizontal Head Turns: Mild Impairment Gait with Vertical Head Turns: Mild Impairment Gait and Pivot Turn: Mild Impairment Step Over Obstacle: Mild Impairment Step Around Obstacles: Mild Impairment Steps: Mild Impairment Total Score: 16       Pertinent Vitals/Pain Pain Assessment: No/denies pain    Home Living Family/patient expects to be discharged to:: Private residence Living Arrangements: Spouse/significant other Available Help at Discharge: Family;Available 24 hours/day Type of Home: House Home Access: Stairs to enter Entrance Stairs-Rails: Chemical engineer of Steps: 4 Home Layout: One level Home Equipment: None      Prior Function Level of Independence: Independent         Comments: has a cleaner,  otherwise is completely independent     Hand  Dominance   Dominant Hand: Left    Extremity/Trunk Assessment   Upper Extremity Assessment Upper Extremity Assessment: Defer to OT evaluation;RUE deficits/detail RUE Deficits / Details: Poor fine motor skills, difficulty with force grading RUE Sensation: decreased proprioception    Lower Extremity Assessment Lower Extremity Assessment: Generalized weakness    Cervical / Trunk Assessment Cervical / Trunk Assessment: Normal  Communication   Communication: No difficulties  Cognition Arousal/Alertness: Awake/alert Behavior During Therapy: WFL for tasks assessed/performed Overall Cognitive Status: Within Functional Limits for tasks assessed                                 General Comments: inattention vs poor proprioception RUE during hallway navigation, PT cuing pt to attend to R      General Comments      Exercises     Assessment/Plan    PT Assessment Patient needs continued PT services  PT Problem List Decreased strength;Decreased mobility;Decreased activity tolerance;Decreased balance;Impaired sensation;Decreased coordination;Decreased safety awareness       PT Treatment Interventions DME instruction;Therapeutic activities;Gait training;Therapeutic exercise;Patient/family education;Balance training;Stair training;Functional mobility training;Neuromuscular re-education    PT Goals (Current goals can be found in the Care Plan section)  Acute Rehab PT Goals PT Goal Formulation: With patient Time For Goal Achievement: 01/07/21 Potential to Achieve Goals: Good    Frequency Min 3X/week   Barriers to discharge        Co-evaluation               AM-PAC PT "6 Clicks" Mobility  Outcome Measure Help needed turning from your back to your side while in a flat bed without using bedrails?: A Little Help needed moving from lying on your back to sitting on the side of a flat bed without using bedrails?: A Little Help needed moving to and from a bed to a  chair (including a wheelchair)?: A Little Help needed standing up from a chair using your arms (e.g., wheelchair or bedside chair)?: A Little Help needed to walk in hospital room?: A Little Help needed climbing 3-5 steps with a railing? : A Little 6 Click Score: 18    End of Session   Activity Tolerance: Patient tolerated treatment well Patient left: in bed;with call bell/phone within reach;with bed alarm set Nurse Communication: Mobility status PT Visit Diagnosis: Other abnormalities of gait and mobility (R26.89);Difficulty in walking, not elsewhere classified (R26.2)    Time: 8250-5397 PT Time Calculation (min) (ACUTE ONLY): 24 min   Charges:   PT Evaluation $PT Eval Low Complexity: 1 Low PT Treatments $Gait Training: 8-22 mins        Stacie Glaze, PT Acute Rehabilitation Services Pager [phone]  Office [phone]  Roxine Caddy E Ruffin Pyo 12/24/2020, 5:35 PM

## 2020-12-24 NOTE — Progress Notes (Signed)
The patient was seen and consented this afternoon for simulation. We reviewed her 3T MRI that showed only 3 parenchymal lesions rather than 4. She has two soft tissue findings of the scalp, but reports that she has known about at least one for many years and was told it was hypervascular change. We will proceed with SRS on Tuesday. She may be able to go home prior to this so we will coordinate if she discharges prior to then.     Carola Rhine, PAC

## 2020-12-25 ENCOUNTER — Other Ambulatory Visit (HOSPITAL_COMMUNITY): Payer: Self-pay | Admitting: Internal Medicine

## 2020-12-25 ENCOUNTER — Telehealth: Payer: Medicare Other | Admitting: Hematology and Oncology

## 2020-12-25 LAB — CBC
HCT: 38.1 % (ref 36.0–46.0)
Hemoglobin: 12.9 g/dL (ref 12.0–15.0)
MCH: 32 pg (ref 26.0–34.0)
MCHC: 33.9 g/dL (ref 30.0–36.0)
MCV: 94.5 fL (ref 80.0–100.0)
Platelets: 197 10*3/uL (ref 150–400)
RBC: 4.03 MIL/uL (ref 3.87–5.11)
RDW: 14.2 % (ref 11.5–15.5)
WBC: 9.5 10*3/uL (ref 4.0–10.5)
nRBC: 0 % (ref 0.0–0.2)

## 2020-12-25 LAB — GLUCOSE, CAPILLARY
Glucose-Capillary: 163 mg/dL — ABNORMAL HIGH (ref 70–99)
Glucose-Capillary: 248 mg/dL — ABNORMAL HIGH (ref 70–99)
Glucose-Capillary: 274 mg/dL — ABNORMAL HIGH (ref 70–99)
Glucose-Capillary: 288 mg/dL — ABNORMAL HIGH (ref 70–99)

## 2020-12-25 LAB — COMPREHENSIVE METABOLIC PANEL
ALT: 14 U/L (ref 0–44)
AST: 17 U/L (ref 15–41)
Albumin: 3.4 g/dL — ABNORMAL LOW (ref 3.5–5.0)
Alkaline Phosphatase: 37 U/L — ABNORMAL LOW (ref 38–126)
Anion gap: 11 (ref 5–15)
BUN: 25 mg/dL — ABNORMAL HIGH (ref 8–23)
CO2: 22 mmol/L (ref 22–32)
Calcium: 9.6 mg/dL (ref 8.9–10.3)
Chloride: 106 mmol/L (ref 98–111)
Creatinine, Ser: 0.88 mg/dL (ref 0.44–1.00)
GFR, Estimated: >60 mL/min (ref >60)
Glucose, Bld: 172 mg/dL — ABNORMAL HIGH (ref 70–99)
Potassium: 4.1 mmol/L (ref 3.5–5.1)
Sodium: 139 mmol/L (ref 135–145)
Total Bilirubin: 0.9 mg/dL (ref 0.3–1.2)
Total Protein: 5.7 g/dL — ABNORMAL LOW (ref 6.5–8.1)

## 2020-12-25 MED ORDER — LEVETIRACETAM 500 MG PO TABS: 500.0000 mg | ORAL_TABLET | Freq: Two times a day (BID) | ORAL | 0 refills | Status: DC

## 2020-12-25 MED ORDER — "PEN NEEDLES 3/16"" 31G X 5 MM MISC": 0 refills | Status: AC

## 2020-12-25 MED ORDER — INSULIN ASPART 100 UNIT/ML FLEXPEN: PEN_INJECTOR | SUBCUTANEOUS | 0 refills | Status: DC

## 2020-12-25 MED ORDER — FAMOTIDINE 20 MG PO TABS: 20.0000 mg | ORAL_TABLET | Freq: Two times a day (BID) | ORAL | 0 refills | Status: DC

## 2020-12-25 MED ORDER — PANTOPRAZOLE SODIUM 20 MG PO TBEC: 20.0000 mg | DELAYED_RELEASE_TABLET | Freq: Every day | ORAL | 0 refills | Status: DC

## 2020-12-25 MED ORDER — BLOOD GLUCOSE METER KIT: PACK | 0 refills | Status: AC

## 2020-12-25 MED ORDER — DEXAMETHASONE 4 MG PO TABS: 4.0000 mg | ORAL_TABLET | Freq: Three times a day (TID) | ORAL | 0 refills | Status: DC

## 2020-12-25 MED FILL — levETIRAcetam 500 MG TABS: 500 | 30 days supply | Qty: 60 | Fill #0

## 2020-12-25 MED FILL — NOVOLOG FLEXPEN SYRINGE: 100 | 20 days supply | Qty: 3 | Fill #0

## 2020-12-25 MED FILL — DEXAMETHASONE 4 MG TABLET: 4 | 7 days supply | Qty: 21 | Fill #0

## 2020-12-25 MED FILL — PENTIPS 31G X 5 MM MISC: 31G X 5 MM | 90 days supply | Qty: 100 | Fill #0

## 2020-12-25 MED FILL — FAMOTIDINE 20 MG TABLET: 20 | 30 days supply | Qty: 60 | Fill #0

## 2020-12-25 MED FILL — PANTOPRAZOLE SOD DR 20 MG T: 20 | 30 days supply | Qty: 30 | Fill #0

## 2020-12-25 NOTE — Consult Note (Signed)
CC: right sided weakness  HPI:     Patient is a 70 y.o. female with DM, and metastatic uterine cancer.  She has been receiving Keytruda and Lenvima with satisfactory control of her systemic disease.  She presented to the hospital for dizziness, weakness, and speech difficulties.     Patient Active Problem List   Diagnosis Date Noted  . Brain metastasis ([place]) 12/23/2020  . Metastasis to brain ([place]) 12/22/2020  . Acquired hypothyroidism 12/11/2020  . Chronic diarrhea 12/01/2020  . Skin rash 12/01/2020  . 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 Type 2 diabetes mellitus with hyperglycemia, without long-term current use of insulin ([place]) 10/02/2020  . Hyperparathyroidism, unspecified ([place])   . Port-A-Cath in place 01/13/2020  . Pulmonary infiltrates 12/19/2019  . Kidney lesion, native, left 12/19/2019  . Goals of care, counseling/discussion 12/12/2019  . Uterine cancer ([place]) 12/11/2019  . 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 Other fatigue 12/11/2019  . Pancytopenia, acquired ([place]) 12/11/2019  . Family history of cancer 12/11/2019  . Diabetes mellitus without complication, without long-term current use of insulin ([place]) 12/11/2019  . Essential hypertension 12/11/2019  . 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 Cancer ([place]) 2019  . Diabetes mellitus without complication ([place])   . Hyperparathyroidism, unspecified ([place])   . Hypertension     Past Surgical History:  Procedure Laterality Date  . ABDOMINAL HYSTERECTOMY    . CESAREAN SECTION    . PARATHYROIDECTOMY    . TONSILLECTOMY      Medications Prior to Admission  Medication Sig Dispense Refill Last Dose  . amLODipine (NORVASC) 10 MG tablet Take 1 tablet (10 mg total) by mouth daily. 90 tablet 3 12/22/2020 at Unknown time  . aspirin EC 81 MG tablet Take 81 mg by mouth daily.   12/22/2020 at Unknown time  . clonazePAM (KLONOPIN) 0.5 MG tablet Take 1 tablet (0.5  mg total) by mouth at bedtime. (Patient taking differently: Take 0.5 mg by mouth at bedtime as needed (sleep).) 30 tablet 4 Past Month at Unknown time  . glipiZIDE (GLUCOTROL) 5 MG tablet Take 0.5 tablets by mouth 2 (two) times daily.   12/22/2020 at AM  . hydrALAZINE (APRESOLINE) 25 MG tablet Take 1 tablet (25 mg total) by mouth 3 (three) times daily. 270 tablet 3 12/22/2020 at Unknown time  . hydrochlorothiazide (HYDRODIURIL) 25 MG tablet Take 1 tablet (25 mg total) by mouth daily. 90 tablet 3 12/21/2020 at Unknown time  . HYDROcodone-acetaminophen (NORCO/VICODIN) 5-325 MG tablet Take 1 tablet by mouth every 6 (six) hours as needed. (Patient taking differently: Take 1 tablet by mouth every 6 (six) hours as needed for moderate pain.) 60 tablet 0 12/22/2020 at Unknown time  . lenvatinib 10 mg daily dose (LENVIMA, 10 MG DAILY DOSE,) capsule Take 1 capsule (10 mg total) by mouth daily. 30 capsule 11 12/22/2020 at 2000  . levothyroxine (SYNTHROID) 25 MCG tablet Take 1 tablet (25 mcg total) by mouth daily before breakfast. 30 tablet 11 12/21/2020 at Unknown time  . lidocaine-prilocaine (EMLA) cream Apply to affected area once 30 g 3 Past Month at Unknown time  . losartan (COZAAR) 100 MG tablet Take 100 mg by mouth daily.   12/21/2020 at Unknown time  . metoprolol succinate (TOPROL-XL) 100 MG 24 hr tablet Take 100 mg by mouth daily. Take with or immediately following a meal.   12/22/2020 at 0700-0800  . ondansetron (ZOFRAN) 8 MG tablet Take  1 tablet (8 mg total) by mouth every 8 (eight) hours as needed (Nausea or vomiting). 30 tablet 1 unk  . prochlorperazine (COMPAZINE) 10 MG tablet Take 1 tablet (10 mg total) by mouth every 6 (six) hours as needed (Nausea or vomiting). 30 tablet 1 unk  . rosuvastatin (CRESTOR) 10 MG tablet Take 1 tablet (10 mg total) by mouth daily. 90 tablet 2 12/21/2020 at Unknown time  . terazosin (HYTRIN) 2 MG capsule Take 1 capsule (2 mg total) by mouth at bedtime. 30 capsule 1 12/21/2020 at  Unknown time   Allergies  Allergen Reactions  . Lisinopril Swelling    Face swelling Face swelling   . Verapamil Rash  . Other Rash    ADHESIVES ADHESIVES ADHESIVES ADHESIVES     Social History   Tobacco Use  . Smoking status: Never Smoker  . Smokeless tobacco: Never Used  Substance Use Topics  . Alcohol use: Never    Family History  Problem Relation Age of Onset  . Cancer Mother 58       breast ca  . Cancer Father        testicular, kidney cancer, liver  . Stroke Father   . Diabetes Father   . Cancer Brother        testicular cancer     Review of Systems Pertinent items noted in HPI and remainder of comprehensive ROS otherwise negative.  Objective:   Patient Vitals for the past 8 hrs:  BP Temp Temp src Pulse Resp SpO2  12/25/20 1650 (!) 123/59 98.4 F (36.9 C) Oral (!) 58 20 95 %  12/25/20 1250 (!) 105/47 97.8 F (36.6 C) Oral (!) 55 17 96 %  12/25/20 0935 (!) 104/54 (!) 97.5 F (36.4 C) Oral 65 18 99 %   I/O last 3 completed shifts: In: 260 [P.O.:260] Out: -  No intake/output data recorded.      General : Alert, cooperative, no distress, appears stated age   Head:  Normocephalic/atraumatic    Eyes: PERRL, conjunctiva/corneas clear, EOM's intact. Fundi could not be visualized Neck: Supple Chest:  Respirations unlabored Chest wall: no tenderness or deformity Heart: Regular rate and rhythm Abdomen: Soft, nontender and nondistended Extremities: warm and well-perfused Skin: normal turgor, color and texture Neurologic:  Alert, oriented x 3.  Speech slightly slurred, some word finding difficulty but maintains some fluency. Eyes open spontaneously. PERRL, EOMI, right inf field cut,  right facial droop. V1-3 intact.  No dysarthria, tongue protrusion symmetric. Right pronator drift, dysdiadochokinesia.  Right leg 4/5.       Data Review CBC:  Lab Results  Component Value Date   WBC 9.5 12/25/2020   RBC 4.03 12/25/2020   BMP:  Lab Results   Component Value Date   GLUCOSE 172 (H) 12/25/2020   CO2 22 12/25/2020   BUN 25 (H) 12/25/2020   CREATININE 0.88 12/25/2020   CREATININE 1.00 12/11/2020   CALCIUM 9.6 12/25/2020   Radiology review:  MRI brain reviewed.  Large 4.2 cm partially cystic mass in left parietal lobe in somatosensory area or SPL with significant mass effect and surrounding vasogenic edema.  1.6 cm left frontal mass and 5 mm right frontal lobe lesion.  Assessment:   Principal Problem:   Metastasis to brain [facility]) Active Problems:   Uterine cancer ([place])   Diabetes mellitus without complication, without long-term current use of insulin ([place])   Essential hypertension   Brain metastasis ([place])  70 yo F with metastatic uterine cancer, now with metastatic  brain disease.  She has 3 lesions including a large symptomatic 4.2 cm left parietal lobe lesion.  Plan:   - I had a long discussion with the patient and her family.  Given the significant size of the symptomatic lesion, I am recommending treating her largest lesion with SRS and surgery, with SRS for the two smaller lesion. In order to more quickly initiate therapy on her smaller lesions, preop SRS followed soon after by surgical resection of the large lesion would likely be best.  Additionally, as patient is on Lenvima, this will allow ~7 days for it to come out of her system.  It may still increase her risk of postoperative wound healing and infection, but unfortunately for this large lesion, surgery is the only effective palliative option if they want to continue with treatment.  Risks, benefits, alternatives, and expected  Convalescence were discussed with the patient and family.  Risks discussed included,  but were not limited to bleeding, pain, infection, seizure, stroke, scar, recurrence, cerebrospinal fluid leak, neurologic deficit, paralysis, coma and death.  I also discussed with her the possibility of blood transfusion during surgery and consent to transfuse  blood products if necessary was also obtained.  All questions and concerns were answered and understanding and agreement with the plan was verbalized.  Tentative plan is to proceed with [facility] Tuesday, surgery Wednesday.  She will need to hold her aspirin in addition to her Lenvima.

## 2020-12-25 NOTE — TOC Transition Note (Signed)
Transition of Care [facility]) - CM/SW Discharge Note   Patient Details  Name: Sandra Arroyo MRN: [id number] Date of Birth: Feb 25, 1951  Transition of Care [facility]) CM/SW Contact:  Pollie Friar, RN Phone Number: 12/25/2020, 1:00 PM   Clinical Narrative:    Pt discharging home with self care. Pt has recommendations for outpatient therapy but she prefers to wait to start this after her up coming surgery. MD updated.  Pt has 24 hour supervision at home and transportation home.    Final next level of care: Home/Self Care Barriers to Discharge: No Barriers Identified   Patient Goals and CMS Choice        Discharge Placement                       Discharge Plan and Services                                     Social Determinants of Health (SDOH) Interventions     Readmission Risk Interventions No flowsheet data found.

## 2020-12-25 NOTE — Discharge Summary (Signed)
Sandra Arroyo TAV:[id number] DOB: 1951/10/02 DOA: 12/22/2020  PCP: [name], DO  Admit date: 12/22/2020  Discharge date: 12/25/2020  Admitted From: Home   disposition: Home   Recommendations for Outpatient Follow-up:   Follow-up with hematology oncology in 1 week or less, Dr. Alvy Bimler.  Home Health: None Equipment/Devices: Glucometer Consultations: Hematology oncology, neurosurgery, radiation oncology Discharge Condition: Improved CODE STATUS: Full Diet Recommendation: Regular  Diet Order            Diet general           Diet Carb Modified Fluid consistency: Thin; Room service appropriate? Yes  Diet effective now                  No chief complaint on file.    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 Work-up revealed intracranial metastases with some mass-effect. Patient was admitted for palliative radiation therapy, steroids and seizure prophylaxis with Keppra.                                                                   Hospital Course   She was seen by hematology oncology, patient oncology and neurosurgery.  She was improved with treatment with dexamethasone.  Dexamethasone did cause increase in blood sugar which was treated with SSI insulin.  Patient family wanted aggressive care and removal of brain metastases.  She was seen by Dr. [name] who is planning for surgery next Wednesday.   Patient was discharged home off aspirin and Lenvima She was started on simethicone 4 mg 3 times daily, SSI insulin and Keppra. Also started on Protonix and Pepcid for prophylaxis given  initiation of dexamethasone. Dr. Simeon Craft such will be following her closely She was seen by Dr. [name] for surgery on Wednesday.    Discharge diagnosis     Principal Problem:   Metastasis to brain [facility]) Active Problems:   Uterine cancer ([place])   Diabetes mellitus without complication, without long-term current use of insulin ([place])   Essential hypertension   Brain metastasis [facility])    Discharge instructions    Discharge Instructions    Call MD for:  persistant dizziness or light-headedness   Complete by: As directed    Diet general   Complete by: As directed    Discharge instructions   Complete by: As directed    1.  You will need to take dexamethasone 3 times a day.  I have prescribed you enough for 1 week.  Discuss with Dr. [name] and Dr. [name] whether you need to take this after your surgery, if so they will  need to give you a another prescription. 2.  Your blood sugar is elevated because of the dexamethasone.  You will need to give yourself insulin to keep your blood sugar down.  Make sure you understand how to give yourself insulin and how much insulin to give yourself depending upon what your blood sugar is.  This should be explained to you by the nurse before you leave. 3.  You have been started on Keppra 500 mg twice a day to prevent seizures.  Discuss with your physicians how long you need to be on this.  I have prescribed you a 1 month supply. 4.  You have been started on Pepcid and on Protonix to protect your stomach while you are on the Decadron.  I have prescribed you a 1 month supply of both. 5.  Stop taking your glipizide while you are on the insulin. 6.  Stop taking your hydrochlorothiazide.  Your blood pressure has been a little low while you have been here. 7.  Stop taking your aspirin until your surgery.  You can discuss with Dr. [name] when you can start that back again after surgery. 8.  Dr. [name] has stopped your Lenvima.   Increase activity slowly   Complete  by: As directed       Discharge Medications   Allergies as of 12/25/2020      Reactions   Lisinopril Swelling   Face swelling Face swelling   Verapamil Rash   Other Rash   ADHESIVES ADHESIVES ADHESIVES ADHESIVES      Medication List    STOP taking these medications   aspirin EC 81 MG tablet   glipiZIDE 5 MG tablet Commonly known as: GLUCOTROL   hydrochlorothiazide 25 MG tablet Commonly known as: HYDRODIURIL   Lenvima (10 MG Daily Dose) capsule Generic drug: lenvatinib 10 mg daily dose     TAKE these medications   amLODipine 10 MG tablet Commonly known as: NORVASC Take 1 tablet (10 mg total) by mouth daily.   blood glucose meter kit and supplies Dispense based on patient and insurance preference. Use up to four times daily as directed. (FOR ICD-10 E10.9, E11.9). Notes to patient: **NEW** To measure blood glucose    clonazePAM 0.5 MG tablet Commonly known as: KlonoPIN Take 1 tablet (0.5 mg total) by mouth at bedtime. What changed:   when to take this  reasons to take this   dexamethasone 4 MG tablet Commonly known as: DECADRON Take 1 tablet (4 mg total) by mouth 3 (three) times daily for 7 days. Notes to patient: **NEW** To decrease brain swelling. May cause increased blood sugar levels   famotidine 20 MG tablet Commonly known as: PEPCID Take 1 tablet (20 mg total) by mouth 2 (two) times daily. Notes to patient: **NEW** To lower stomach acid    hydrALAZINE 25 MG tablet Commonly known as: APRESOLINE Take 1 tablet (25 mg total) by mouth 3 (three) times daily.   HYDROcodone-acetaminophen 5-325 MG tablet Commonly known as: NORCO/VICODIN Take 1 tablet by mouth every 6 (six) hours as needed. What changed: reasons to take this   insulin aspart 100 UNIT/ML FlexPen Commonly known as: NOVOLOG CBG 70 - 120: 0 units  CBG 121 - 150: 2 units  CBG 151 - 200: 3 units  CBG 201 - 250: 5 units  CBG 251 - 300: 8 units  CBG 301 - 350: 11 units  CBG 351 -  400: 15 units Notes to patient: **NEW** Short-acting insulin to lower blood sugar  with meals    levETIRAcetam 500 MG tablet Commonly known as: KEPPRA Take 1 tablet (500 mg total) by mouth 2 (two) times daily. Notes to patient: **NEW** To prevent seizures   levothyroxine 25 MCG tablet Commonly known as: Synthroid Take 1 tablet (25 mcg total) by mouth daily before breakfast.   lidocaine-prilocaine cream Commonly known as: EMLA Apply to affected area once   losartan 100 MG tablet Commonly known as: COZAAR Take 100 mg by mouth daily.   metoprolol succinate 100 MG 24 hr tablet Commonly known as: TOPROL-XL Take 100 mg by mouth daily. Take with or immediately following a meal.   ondansetron 8 MG tablet Commonly known as: Zofran Take 1 tablet (8 mg total) by mouth every 8 (eight) hours as needed (Nausea or vomiting).   pantoprazole 20 MG tablet Commonly known as: Protonix Take 1 tablet (20 mg total) by mouth daily. Notes to patient: **NEW** To lower stomach acid   Pen Needles 3/16" 31G X 5 MM Misc Use as directed with insulin pen Notes to patient: **NEW** Use with insulin pen   prochlorperazine 10 MG tablet Commonly known as: COMPAZINE Take 1 tablet (10 mg total) by mouth every 6 (six) hours as needed (Nausea or vomiting).   rosuvastatin 10 MG tablet Commonly known as: CRESTOR Take 1 tablet (10 mg total) by mouth daily.   terazosin 2 MG capsule Commonly known as: HYTRIN Take 1 capsule (2 mg total) by mouth at bedtime.         Major procedures and Radiology Reports - PLEASE review detailed and final reports thoroughly  -        MR BRAIN W WO CONTRAST  Result Date: 12/24/2020 CLINICAL DATA:  Brain/CNS neoplasm, staging. EXAM: MRI HEAD WITHOUT AND WITH CONTRAST TECHNIQUE: Multiplanar, multiecho pulse sequences of the brain and surrounding structures were obtained without and with intravenous contrast. CONTRAST:  50m GADAVIST GADOBUTROL 1 MMOL/ML IV SOLN COMPARISON:   Brain MRI 12/22/2020. FINDINGS: Brain: Cerebral volume is normal for age. 4.2 x 3.8 x 3.8 cm centrally necrotic mass with peripheral curvilinear and nodular enhancement within the left frontoparietal lobes and extending to the left temporoparietal junction, not significantly changed in size from the brain MRI of 12/22/2020 (series 9, image 142). Unchanged moderate surrounding vasogenic edema. Unchanged mass effect with effacement of the left lateral ventricle atrium. Notably, this mass involves the left pre and postcentral gyri. Anterior to this, there is a 1.4 x 1.6 x 1.7 cm mass within the left frontal lobe which has not significantly changed in size (series 9, image 164). This mass involves the left superior and possibly middle frontal gyri. As before, this mass demonstrates precontrast T1 hyperintensity and SWI signal loss consistent with nonacute hemorrhage. Precontrast T1 hyperintensity limits evaluation for enhancement. Mild surrounding vasogenic edema, unchanged.The previously questioned satellite lesion along the left anterolateral aspect appears to reflect vascular enhancement. Unchanged 5 mm enhancing cortical lesion within the mid right frontal lobe (series 9, image 153). Corresponding precontrast T1 hyperintensity and SWI signal loss at this site consistent with nonacute blood products. No new intracranial metastasis is identified. As before, there is some restricted diffusion associated with the lesions. However, there is no evidence of acute infarction. Background mild multifocal T2/FLAIR hyperintensity within the cerebral white matter is nonspecific, but compatible with chronic small vessel ischemic disease. No extra-axial fluid collection. No midline shift. Vascular: Expected proximal arterial flow voids. Non dominant intracranial left vertebral artery. Right temporal lobe developmental venous anomaly. Skull and upper  cervical spine: No focal marrow lesion. Sinuses/Orbits: Visualized orbits show no  acute finding. Small mucous retention cysts and mild mucosal thickening within the bilateral maxillary sinuses. Other: Nonspecific 8 mm enhancing lesion within the right temporal scalp (series 9, image 139). Nonspecific 1.8 x 1.9 cm enhancing lesion within the right parietal scalp (series 9, image 188). IMPRESSION: 3 parenchymal lesions within the bilateral cerebral hemispheres are unchanged from the brain MRI of 12/22/2020 and likely reflect metastases. The largest lesion measures 4.2 x 3.8 x 3.6 cm and is located within the left frontoparietal lobes with extension to the left temporoparietal junction. Unchanged moderate surrounding vasogenic edema with regional mass effect and partial effacement of the left lateral ventricle atrium. Notably, this dominant lesion balls the left pre and postcentral gyri. A previously questioned additional punctate metastasis within the left frontal lobe appears to reflect vascular enhancement. Nonspecific enhancing lesions measuring 8 mm within the right temporal scalp and measuring 1.8 x 1.9 cm within the right parietal scalp. Tumor deposits cannot be excluded. Direct visualization recommended. Additionally, direct tissue sampling can be considered. Electronically Signed   By: Kellie Simmering DO   On: 12/24/2020 13:37   MR Brain W and Wo Contrast  Result Date: 12/22/2020 CLINICAL DATA:  Metastatic disease evaluation. Additional provided: Slurred speech, generalized weakness as of today. Additional history obtained from [place] NUMBERHistory of metastatic uterine cancer. EXAM: MRI HEAD WITHOUT AND WITH CONTRAST TECHNIQUE: Multiplanar, multiecho pulse sequences of the brain and surrounding structures were obtained without and with intravenous contrast. CONTRAST:  44m GADAVIST GADOBUTROL 1 MMOL/ML IV SOLN COMPARISON:  No pertinent prior exams available for comparison. FINDINGS: Brain: Mild intermittent motion degradation, limiting evaluation. This includes mild motion  degradation of the T1 weighted postcontrast sequences. Cerebral volume is normal for age. There are multiple enhancing parental lesions likely reflecting intracranial metastatic disease, as follows. Within the left frontoparietal lobes and extending to the left temporoparietal junction, there is a centrally necrotic mass with peripheral curvilinear and nodular enhancement measuring 4.2 x 3.8 x 3.6 cm (series 15, image 39). Moderate surrounding vasogenic edema. Notably, this mass involves the left pre and postcentral gyri. Associated mass effect results in partial effacement of the atrium of the left lateral ventricle. More anteriorly within the left frontal lobe, there is a 1.4 x 1.6 x 1.6 cm lesion within the left superior frontal, and possibly middle frontal, gyri. Precontrast T1 hyperintensity limits evaluation for enhancement. Additionally, there is SWI signal loss associated with this lesion and findings likely reflect nonacute hemorrhage. Mild surrounding vasogenic edema. There is a punctate satellite lesion along its anterolateral aspect (series 15, image 45). 5 mm enhancing lesion within the cortex of the mid right frontal lobe (series 15, image 42). No significant surrounding edema. Some restricted diffusion is associated with the metastatic lesions. However, there is no evidence of acute infarction. Background mild multifocal T2/FLAIR hyperintensity within the cerebral white matter is nonspecific, but compatible with chronic small vessel ischemic disease. There are a few punctate foci of SWI signal loss within the bilateral cerebral hemispheres compatible with nonspecific chronic microhemorrhages. No extra-axial fluid collection. No midline shift. Vascular: Expected proximal arterial flow voids. Skull and upper cervical spine: No focal marrow lesion Sinuses/Orbits: Visualized orbits show no acute finding. Mild bilateral ethmoid and maxillary sinus mucosal thickening. Other: Nonspecific 1.8 x 1.9 cm right  parietal scalp lesion (series 8, image 25) (series 14, image 11). IMPRESSION: The examination is motion degraded and this may obscure small parenchymal lesions.  Four parenchymal lesions are identified within the bilateral cerebral hemispheres, as described and likely reflecting intracranial metastatic disease. The largest lesion measures 4.2 x 3.8 x 3.6 cm and is located within the left frontoparietal lobes with extension to the left temporoparietal junction. Moderate surrounding vasogenic edema. Regional mass effect with partial effacement of the left lateral ventricle atrium. Notably, this dominant lesion involves the left pre and postcentral gyri. 1.8 x 1.9 cm nonspecific right parietal scalp lesion. Direct visualization is recommended. Mild cerebral white matter chronic small vessel ischemic disease. Mild paranasal sinus disease as described. Electronically Signed   By: Kellie Simmering DO   On: 12/22/2020 21:51   CT CHEST ABDOMEN PELVIS W CONTRAST  Result Date: 11/30/2020 CLINICAL DATA:  Restaging uterine cancer. Chemotherapy and radiation therapy complete. Ongoing immunotherapy. EXAM: CT CHEST, ABDOMEN, AND PELVIS WITH CONTRAST TECHNIQUE: Multidetector CT imaging of the chest, abdomen and pelvis was performed following the standard protocol during bolus administration of intravenous contrast. CONTRAST:  155mL OMNIPAQUE IOHEXOL 300 MG/ML  SOLN COMPARISON:  CT abdomen pelvis 08/31/2020 and CT chest 03/09/2020. FINDINGS: CT CHEST FINDINGS Cardiovascular: Right IJ Port-A-Cath terminates in the low SVC. Atherosclerotic calcification of the aorta and coronary arteries. Pulmonic trunk and heart are enlarged. No pericardial effusion. Mediastinum/Nodes: Low right paratracheal lymph node measures 7 mm (2/25), stable. Left hilar lymph nodes measure up to 1.3 cm (2/26), increased from 8 mm. No axillary adenopathy. Esophagus is grossly unremarkable. Lungs/Pleura: Some pulmonary nodules have resolved in the interval. For  example, a previously seen 8 mm lateral right lower lobe nodule (6/111) has resolved when compared with 08/31/2020. Other small pulmonary nodules are stable, measuring up to 6 mm in the lateral right lower lobe (6/114). No pleural fluid. Airway is unremarkable. Musculoskeletal: Postoperative changes in the proximal right humerus. Two faint areas of sclerosis in the proximal humerus (6/10) are unchanged. Mild compression of the T2 vertebral body is unchanged. CT ABDOMEN PELVIS FINDINGS Hepatobiliary: Liver is decreased in attenuation diffusely. Liver is otherwise unremarkable. Cholecystectomy. No biliary ductal dilatation. Pancreas: Negative. Spleen: Negative. Adrenals/Urinary Tract: Adrenal glands are unremarkable. Low-attenuation lesions in the kidneys measure up to 2.8 cm on the right and are indicative of cysts. A 10 mm hyperdense lesion off the lower pole right kidney (2/75) and a 1.3 cm hyperdense lesion off the lower pole left kidney appear unchanged but difficult to further characterize due to size and lack of precontrast imaging. Ureters are decompressed. Small amount of air in the bladder is presumably iatrogenic. Stomach/Bowel: Small hiatal hernia. Stomach, small bowel, appendix and colon are otherwise unremarkable. Vascular/Lymphatic: Atherosclerotic calcification of the aorta without aneurysm. No pathologically enlarged lymph nodes. Specifically, abdominal retroperitoneal lymph nodes are subcentimeter in size, as before. Reproductive: Hysterectomy.  No adnexal mass. Other: No free fluid.  Mesenteries and peritoneum are unremarkable. Musculoskeletal: Degenerative changes in the spine. No worrisome lytic or sclerotic lesions. Degenerative changes in the hips. Mild levoconvex scoliosis of the lumbar spine. IMPRESSION: 1. Improving pulmonary metastatic disease. 2. Left hilar lymph nodes have increased in size slightly in the interval. Subcentimeter abdominal retroperitoneal lymph nodes are stable. 3.  Hyperattenuating lesions in the inferior aspects of both kidneys are too small to characterize. Further evaluation with pre and post contrast MRI should be considered. Pre and post contrast CT could alternatively be performed, but would likely be of decreased accuracy given lesion size. 4. Hepatic steatosis. 5. Aortic atherosclerosis (ICD10-I70.0). Coronary artery calcification. 6. Enlarged pulmonic trunk, indicative of pulmonary arterial hypertension. Electronically Signed  By: Leanna Battles M.D.   On: 11/30/2020 11:21    Micro Results    Recent Results (from the past 240 hour(s))  SARS Coronavirus 2 by RT PCR (hospital order, performed in [facility] hospital lab) Nasopharyngeal Nasopharyngeal Swab     Status: None   Collection Time: 12/22/20  9:10 PM   Specimen: Nasopharyngeal Swab  Result Value Ref Range Status   SARS Coronavirus 2 NEGATIVE NEGATIVE Final    Comment: (NOTE) SARS-CoV-2 target nucleic acids are NOT DETECTED.  The SARS-CoV-2 RNA is generally detectable in upper and lower respiratory specimens during the acute phase of infection. The lowest concentration of SARS-CoV-2 viral copies this assay can detect is 250 copies / mL. A negative result does not preclude SARS-CoV-2 infection and should not be used as the sole basis for treatment or other patient management decisions.  A negative result may occur with improper specimen collection / handling, submission of specimen other than nasopharyngeal swab, presence of viral mutation(s) within the areas targeted by this assay, and inadequate number of viral copies (<250 copies / mL). A negative result must be combined with clinical observations, patient history, and epidemiological information.  Fact Sheet for Patients:   BoilerBrush.com.cy  Fact Sheet for Healthcare Providers: https://pope.com/  This test is not yet approved or  cleared by the [place] FDA and has been  authorized for detection and/or diagnosis of SARS-CoV-2 by FDA under an Emergency Use Authorization (EUA).  This EUA will remain in effect (meaning this test can be used) for the duration of the COVID-19 declaration under Section 564(b)(1) of the Act, 21 U.S.C. section 360bbb-3(b)(1), unless the authorization is terminated or revoked sooner.  Performed at [facility], [address], [place], [place] [address]     Today   Subjective    Sandra Arroyo feels much improved since admission.  Feels ready to go home.  Denies chest pain, shortness of breath or abdominal pain.  Feels they can take care of themselves with the resources they have at home.  Objective   Blood pressure (!) 123/59, pulse (!) 58, temperature 98.4 F (36.9 C), temperature source Oral, resp. rate 20, height 5\' 2"  (1.575 m), weight 68.9 kg, SpO2 95 %.  No intake or output data in the 24 hours ending 12/25/20 1752  Exam General: Patient appears well and in good spirits sitting up in bed in no acute distress.  Eyes: sclera anicteric, conjuctiva mild injection bilaterally CVS: S1-S2, regular  Respiratory:  decreased air entry bilaterally secondary to decreased inspiratory effort, rales at bases  GI: NABS, soft, NT  LE: No edema.  Neuro: A/O x 3, Moving all extremities equally with normal strength, CN 3-12 intact, grossly nonfocal.  Psych: patient is logical and coherent, judgement and insight appear normal, mood and affect appropriate to situation.    Data Review   CBC w Diff:  Lab Results  Component Value Date   WBC 9.5 12/25/2020   HGB 12.9 12/25/2020   HGB 13.2 12/11/2020   HCT 38.1 12/25/2020   PLT 197 12/25/2020   PLT 167 12/11/2020   LYMPHOPCT 34 12/22/2020   MONOPCT 9 12/22/2020   EOSPCT 0 12/22/2020   BASOPCT 1 12/22/2020    CMP:  Lab Results  Component Value Date   NA 139 12/25/2020   K 4.1 12/25/2020   CL 106 12/25/2020   CO2 22 12/25/2020   BUN 25 (H) 12/25/2020    CREATININE 0.88 12/25/2020   CREATININE 1.00 12/11/2020  PROT 5.7 (L) 12/25/2020   ALBUMIN 3.4 (L) 12/25/2020   BILITOT 0.9 12/25/2020   BILITOT 1.1 12/11/2020   ALKPHOS 37 (L) 12/25/2020   AST 17 12/25/2020   AST 20 12/11/2020   ALT 14 12/25/2020   ALT 14 12/11/2020  .   Total Time in preparing paper work, data evaluation and todays exam - 35 minutes  Vashti Hey M.D on 12/25/2020 at 5:52 PM  [facility]   Office  [phone]

## 2020-12-25 NOTE — Evaluation (Signed)
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 Work-up revealed intracranial metastases with some mass-effect. Patient was admitted for palliative radiation therapy, steroids and seizure prophylaxis with Keppra.   Clinical Impression   PTA, pt was living with her husband and sister and was independent. Pt currently requiring Min guard-Min A for ADLs and functional mobility. Pt presenting with decreased cognition, coordination, and balance impacting her safe performance of ADLs. Pt would benefit from further acute OT to facilitate safe dc. Recommend dc to home with follow up at [facility] for further OT to optimize safety, independence with ADLs, and return to PLOF.     Follow Up Recommendations  Outpatient OT;Supervision/Assistance - 24 hour    Equipment Recommendations  Tub/shower seat    Recommendations for Other Services PT consult     Precautions / Restrictions Precautions Precautions: Fall      Mobility Bed Mobility Overal bed mobility: Needs Assistance Bed Mobility: Supine to Sit     Supine to sit: Supervision     General bed mobility comments: for safety, increased time.    Transfers Overall transfer level: Needs assistance Equipment used: None Transfers: Sit to/from Stand Sit to Stand: Min guard         General transfer comment: Min Guard A for safety    Balance Overall balance assessment: Needs assistance Sitting-balance support: No upper extremity supported;Feet supported Sitting balance-Leahy Scale: Good     Standing balance support: No upper extremity supported;During functional activity Standing balance-Leahy Scale: Fair Standing balance comment: mildly unsteady                            ADL either performed or assessed with clinical judgement   ADL Overall ADL's : Needs assistance/impaired Eating/Feeding: Set up;Sitting Eating/Feeding Details (indicate cue type and reason): Requiring assistance to open containers Grooming: Wash/dry hands;Min guard;Standing   Upper Body Bathing: Min guard;Sitting   Lower Body Bathing: Minimal assistance;Sit to/from stand   Upper Body Dressing : Min guard;Sitting   Lower Body Dressing: Minimal assistance;Sit to/from stand               Functional mobility during ADLs: Minimal assistance;Min guard General ADL Comments: Pt with decreased balance, coorindation, cognition, and safety     Vision Baseline Vision/History: Wears glasses Wears Glasses: Reading only Patient Visual Report: No change from baseline       Perception     Praxis      Pertinent Vitals/Pain Pain Assessment: No/denies pain     Hand Dominance Left   Extremity/Trunk Assessment Upper Extremity Assessment Upper Extremity Assessment: RUE deficits/detail RUE Deficits / Details: Decreased strength. poor attention to RUE. Decreased coorindation. difficulty performing shoulder ROM RUE Sensation: decreased proprioception RUE Coordination: decreased fine motor;decreased gross motor   Lower Extremity Assessment Lower Extremity Assessment: Generalized weakness   Cervical / Trunk Assessment Cervical / Trunk Assessment: Normal   Communication Communication Communication: No difficulties   Cognition Arousal/Alertness: Awake/alert Behavior During Therapy: WFL for tasks assessed/performed Overall Cognitive Status: Impaired/Different from baseline Area of Impairment: Attention;Memory;Following commands;Safety/judgement;Awareness;Problem solving                   Current Attention Level: Selective Memory: Decreased short-term memory Following Commands: Follows one  step commands inconsistently;Follows one step commands with increased  time Safety/Judgement: Decreased awareness of safety Awareness: Intellectual Problem Solving: Slow processing;Requires verbal cues General Comments: Pt with decreased problem solving, awareness, and attention. Pt with difficulty answering questions. For example, asking pt if she is left handed or right, pt quickly stating right then waiting and correcting herself to left. This occured for multiple situations and questions in conversation. Pt requiring increased time and cues throughout. Difficulty performing trail making task and no ST memory recall of which one was her room as well as how to navigate back to her room.   General Comments       Exercises     Shoulder Instructions      Home Living Family/patient expects to be discharged to:: Private residence Living Arrangements: Spouse/significant other;Other relatives (Sister) Available Help at Discharge: Family;Available 24 hours/day Type of Home: House Home Access: Stairs to enter [employer] of Steps: 4 Entrance Stairs-Rails: Left;Right Home Layout: One level     Bathroom Shower/Tub: Teacher, early years/pre: Standard     Home Equipment: None          Prior Functioning/Environment Level of Independence: Independent        Comments: has a cleaner, otherwise is completely independent        OT Problem List: Decreased strength      OT Treatment/Interventions: Self-care/ADL training;Therapeutic exercise;Energy conservation;DME and/or AE instruction;Therapeutic activities;Patient/family education    OT Goals(Current goals can be found in the care plan section) Acute Rehab OT Goals Patient Stated Goal: Go home OT Goal Formulation: With patient Time For Goal Achievement: 01/08/21 Potential to Achieve Goals: Good  OT Frequency: Min 3X/week   Barriers to D/C:            Co-evaluation              AM-PAC OT "6 Clicks" Daily Activity     Outcome Measure Help from another person eating  meals?: None Help from another person taking care of personal grooming?: A Little Help from another person toileting, which includes using toliet, bedpan, or urinal?: A Little Help from another person bathing (including washing, rinsing, drying)?: A Little Help from another person to put on and taking off regular upper body clothing?: A Little Help from another person to put on and taking off regular lower body clothing?: A Little 6 Click Score: 19   End of Session Equipment Utilized During Treatment: Gait belt Nurse Communication: Mobility status  Activity Tolerance: Patient tolerated treatment well Patient left: in chair;with call bell/phone within reach;with chair alarm set  OT Visit Diagnosis: Unsteadiness on feet (R26.81);Other abnormalities of gait and mobility (R26.89);Muscle weakness (generalized) (M62.81)                Time: 9485-4627 OT Time Calculation (min): 19 min Charges:  OT General Charges $OT Visit: 1 Visit OT Evaluation $OT Eval Moderate Complexity: Bath, OTR/L Acute Rehab Pager: [phone] Office: [place] 12/25/2020, 10:00 AM

## 2020-12-25 NOTE — Progress Notes (Signed)
Inpatient Diabetes Program Recommendations  AACE/ADA: New Consensus Statement on Inpatient Glycemic Control (2015)  Target Ranges:  Prepandial:   less than 140 mg/dL      Peak postprandial:   less than 180 mg/dL (1-2 hours)      Critically ill patients:  140 - 180 mg/dL   Lab Results  Component Value Date   GLUCAP 274 (H) 12/25/2020   HGBA1C 6.9 (H) 12/22/2020    Educated patient and son on insulin pen use at home. Reviewed contents of insulin flexpen starter kit. Reviewed all steps if insulin pen including attachment of needle, 2-unit air shot, dialing up dose, giving injection, removing needle, disposal of sharps, storage of unused insulin, disposal of insulin etc. Patient able to provide successful return demonstration. Also reviewed troubleshooting with insulin pen. Educated on s/s and treatment of hypoglycemia. Educated on use of sliding scale. Educated on glucose goals. MD to give patient Rxs for insulin pens and insulin pen needles.  Thanks,  Tama Headings RN, MSN, BC-ADM Inpatient Diabetes Coordinator Team Pager [phone] (8a-5p)

## 2020-12-27 ENCOUNTER — Emergency Department (HOSPITAL_COMMUNITY)
Admission: EM | Admit: 2020-12-27 | Discharge: 2020-12-27 | Disposition: A | Discharge status: Home / Self Care | Payer: Medicare Other | Source: Home / Self Care | Attending: Emergency Medicine | Admitting: Emergency Medicine

## 2020-12-27 ENCOUNTER — Telehealth: Payer: Self-pay | Admitting: Radiation Oncology

## 2020-12-27 ENCOUNTER — Emergency Department (HOSPITAL_COMMUNITY): Payer: Medicare Other

## 2020-12-27 ENCOUNTER — Other Ambulatory Visit: Payer: Self-pay

## 2020-12-27 DIAGNOSIS — Z794 Long term (current) use of insulin: Secondary | ICD-10-CM | POA: Insufficient documentation

## 2020-12-27 DIAGNOSIS — R531 Weakness: Secondary | ICD-10-CM | POA: Insufficient documentation

## 2020-12-27 DIAGNOSIS — Z79899 Other long term (current) drug therapy: Secondary | ICD-10-CM | POA: Insufficient documentation

## 2020-12-27 DIAGNOSIS — R000 Tachycardia, unspecified: Secondary | ICD-10-CM | POA: Insufficient documentation

## 2020-12-27 DIAGNOSIS — C7931 Secondary malignant neoplasm of brain: Secondary | ICD-10-CM | POA: Insufficient documentation

## 2020-12-27 DIAGNOSIS — E1165 Type 2 diabetes mellitus with hyperglycemia: Secondary | ICD-10-CM | POA: Insufficient documentation

## 2020-12-27 DIAGNOSIS — I10 Essential (primary) hypertension: Secondary | ICD-10-CM | POA: Insufficient documentation

## 2020-12-27 LAB — CBC WITH DIFFERENTIAL/PLATELET
Abs Immature Granulocytes: 0.21 10*3/uL — ABNORMAL HIGH (ref 0.00–0.07)
Basophils Absolute: 0 10*3/uL (ref 0.0–0.1)
Basophils Relative: 0 %
Eosinophils Absolute: 0 10*3/uL (ref 0.0–0.5)
Eosinophils Relative: 0 %
HCT: 41.7 % (ref 36.0–46.0)
Hemoglobin: 13.8 g/dL (ref 12.0–15.0)
Immature Granulocytes: 3 %
Lymphocytes Relative: 11 %
Lymphs Abs: 0.7 10*3/uL (ref 0.7–4.0)
MCH: 31.6 pg (ref 26.0–34.0)
MCHC: 33.1 g/dL (ref 30.0–36.0)
MCV: 95.4 fL (ref 80.0–100.0)
Monocytes Absolute: 0.3 10*3/uL (ref 0.1–1.0)
Monocytes Relative: 4 %
Neutro Abs: 5.7 10*3/uL (ref 1.7–7.7)
Neutrophils Relative %: 82 %
Platelets: 201 10*3/uL (ref 150–400)
RBC: 4.37 MIL/uL (ref 3.87–5.11)
RDW: 14.2 % (ref 11.5–15.5)
WBC: 6.9 10*3/uL (ref 4.0–10.5)
nRBC: 0 % (ref 0.0–0.2)

## 2020-12-27 LAB — COMPREHENSIVE METABOLIC PANEL
ALT: 20 U/L (ref 0–44)
AST: 20 U/L (ref 15–41)
Albumin: 3.3 g/dL — ABNORMAL LOW (ref 3.5–5.0)
Alkaline Phosphatase: 36 U/L — ABNORMAL LOW (ref 38–126)
Anion gap: 10 (ref 5–15)
BUN: 21 mg/dL (ref 8–23)
CO2: 23 mmol/L (ref 22–32)
Calcium: 9.6 mg/dL (ref 8.9–10.3)
Chloride: 105 mmol/L (ref 98–111)
Creatinine, Ser: 0.86 mg/dL (ref 0.44–1.00)
GFR, Estimated: >60 mL/min (ref >60)
Glucose, Bld: 168 mg/dL — ABNORMAL HIGH (ref 70–99)
Potassium: 3.9 mmol/L (ref 3.5–5.1)
Sodium: 138 mmol/L (ref 135–145)
Total Bilirubin: 0.9 mg/dL (ref 0.3–1.2)
Total Protein: 5.5 g/dL — ABNORMAL LOW (ref 6.5–8.1)

## 2020-12-27 MED ORDER — LEVOTHYROXINE SODIUM 25 MCG PO TABS
25.0000 ug | ORAL_TABLET | Freq: Once | ORAL | Status: AC
  Administered 2020-12-27: 25 ug via ORAL
  Filled 2020-12-27: qty 1

## 2020-12-27 MED ORDER — FAMOTIDINE 20 MG PO TABS
20.0000 mg | ORAL_TABLET | Freq: Once | ORAL | Status: AC
  Administered 2020-12-27: 20 mg via ORAL
  Filled 2020-12-27: qty 1

## 2020-12-27 MED ORDER — DEXAMETHASONE 4 MG PO TABS
4.0000 mg | ORAL_TABLET | Freq: Once | ORAL | Status: AC
  Administered 2020-12-27: 4 mg via ORAL
  Filled 2020-12-27: qty 1

## 2020-12-27 MED ORDER — LEVETIRACETAM 500 MG PO TABS
500.0000 mg | ORAL_TABLET | Freq: Once | ORAL | Status: AC
  Administered 2020-12-27: 500 mg via ORAL
  Filled 2020-12-27: qty 1

## 2020-12-27 NOTE — ED Provider Notes (Signed)
[facility] EMERGENCY DEPARTMENT Provider Note   CSN: [id number] Arrival date & time: 12/27/20  2831     History Chief Complaint  Patient presents with  . Weakness    Sandra Arroyo is a 70 y.o. female.  Patient is a 70 year old female with a history of uterine cancer with metastases to the brain, diabetes, hypertension, hyperparathyroidism who was recently hospitalized due to brain mets with vasogenic edema and is planning for palliative radiation and met removal next week who was discharged from the hospital 2 days ago and presents today due to concern for worsening right-sided weakness.  Patient's son is also at bedside and reports that throughout the night by the third time she got up to go to the bathroom her leg was dragging and they were having to physically help her which when she went home from the hospital she was able to walk independently.  She is also had aphasia and he feels like that is slightly worse but not significantly different.  She is also not had a bowel movement in 5 days.  She has been eating and denies any swallowing difficulty.  She has had no vision changes.  No headaches, nausea or vomiting.  She denies any fever, chest pain or shortness of breath.  She has been taking the 4 mg of Decadron 3 times daily as prescribed.  Son reports she has been sleeping a lot but has been eating 3 meals a day and will wake up to watch TV.  The history is provided by the patient, a relative and medical records.  Weakness      Past Medical History:  Diagnosis Date  . Cancer ([place]) 2019  . Diabetes mellitus without complication ([place])   . Hyperparathyroidism, unspecified ([place])   . Hypertension     Patient Active Problem List   Diagnosis Date Noted  . Brain metastasis ([place]) 12/23/2020  . Metastasis to brain ([place]) 12/22/2020  . Acquired hypothyroidism 12/11/2020  . Chronic diarrhea 12/01/2020  . Skin rash 12/01/2020  . Metastasis to lung ([place]) 10/27/2020   . Type 2 diabetes mellitus with hyperglycemia, without long-term current use of insulin ([place]) 10/02/2020  . Hyperparathyroidism, unspecified ([place])   . Port-A-Cath in place 01/13/2020  . Pulmonary infiltrates 12/19/2019  . Kidney lesion, native, left 12/19/2019  . Goals of care, counseling/discussion 12/12/2019  . Uterine cancer ([place]) 12/11/2019  . 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 Other fatigue 12/11/2019  . Pancytopenia, acquired ([place]) 12/11/2019  . Family history of cancer 12/11/2019  . Diabetes mellitus without complication, without long-term current use of insulin ([place]) 12/11/2019  . Essential hypertension 12/11/2019  . Peripheral neuropathy due to chemotherapy ([place]) 12/11/2019    Past Surgical History:  Procedure Laterality Date  . ABDOMINAL HYSTERECTOMY    . CESAREAN SECTION    . PARATHYROIDECTOMY    . TONSILLECTOMY       OB History   No obstetric history on file.     Family History  Problem Relation Age of Onset  . Cancer Mother 71       breast ca  . Cancer Father        testicular, kidney cancer, liver  . Stroke Father   . Diabetes Father   . Cancer Brother        testicular cancer    Social History   Tobacco Use  . Smoking status: Never Smoker  . Smokeless tobacco: Never Used  Substance Use Topics  . Alcohol use: Never  Home Medications Prior to Admission medications   Medication Sig Start Date End Date Taking? Authorizing Provider  amLODipine (NORVASC) 10 MG tablet Take 1 tablet (10 mg total) by mouth daily. 10/27/20   Heath Lark, MD  blood glucose meter kit and supplies Dispense based on patient and insurance preference. Use up to four times daily as directed. (FOR ICD-10 E10.9, E11.9). 12/25/20   Vashti Hey, MD  clonazePAM (KLONOPIN) 0.5 MG tablet Take 1 tablet (0.5 mg total) by mouth at bedtime. Patient taking differently: Take 0.5 mg by mouth at bedtime as needed (sleep). 09/15/20   Heath Lark, MD  dexamethasone  (DECADRON) 4 MG tablet Take 1 tablet (4 mg total) by mouth 3 (three) times daily for 7 days. 12/25/20 01/01/21  Vashti Hey, MD  famotidine (PEPCID) 20 MG tablet Take 1 tablet (20 mg total) by mouth 2 (two) times daily. 12/25/20 01/24/21  Vashti Hey, MD  hydrALAZINE (APRESOLINE) 25 MG tablet Take 1 tablet (25 mg total) by mouth 3 (three) times daily. 09/28/20   Heath Lark, MD  HYDROcodone-acetaminophen (NORCO/VICODIN) 5-325 MG tablet Take 1 tablet by mouth every 6 (six) hours as needed. Patient taking differently: Take 1 tablet by mouth every 6 (six) hours as needed for moderate pain. 11/16/20   Heath Lark, MD  insulin aspart (NOVOLOG) 100 UNIT/ML FlexPen CBG 70 - 120: 0 units  CBG 121 - 150: 2 units  CBG 151 - 200: 3 units  CBG 201 - 250: 5 units  CBG 251 - 300: 8 units  CBG 301 - 350: 11 units  CBG 351 - 400: 15 units 12/25/20   Vashti Hey, MD  Insulin Pen Needle (PEN NEEDLES 3/16") 31G X 5 MM MISC Use as directed with insulin pen 12/25/20   Vashti Hey, MD  levETIRAcetam (KEPPRA) 500 MG tablet Take 1 tablet (500 mg total) by mouth 2 (two) times daily. 12/25/20 01/24/21  Vashti Hey, MD  levothyroxine (SYNTHROID) 25 MCG tablet Take 1 tablet (25 mcg total) by mouth daily before breakfast. 12/11/20   Heath Lark, MD  lidocaine-prilocaine (EMLA) cream Apply to affected area once 09/01/20   Heath Lark, MD  losartan (COZAAR) 100 MG tablet Take 100 mg by mouth daily.    [provider]  metoprolol succinate (TOPROL-XL) 100 MG 24 hr tablet Take 100 mg by mouth daily. Take with or immediately following a meal.    [provider]  ondansetron (ZOFRAN) 8 MG tablet Take 1 tablet (8 mg total) by mouth every 8 (eight) hours as needed (Nausea or vomiting). 09/01/20   Heath Lark, MD  pantoprazole (PROTONIX) 20 MG tablet Take 1 tablet (20 mg total) by mouth daily. 12/25/20 12/25/21  Vashti Hey, MD  prochlorperazine  (COMPAZINE) 10 MG tablet Take 1 tablet (10 mg total) by mouth every 6 (six) hours as needed (Nausea or vomiting). 09/01/20   Heath Lark, MD  rosuvastatin (CRESTOR) 10 MG tablet Take 1 tablet (10 mg total) by mouth daily. 05/12/20   [name], DO  terazosin (HYTRIN) 2 MG capsule Take 1 capsule (2 mg total) by mouth at bedtime. 01/13/20   Heath Lark, MD    Allergies    Lisinopril, Verapamil, and Other  Review of Systems   Review of Systems  Neurological: Positive for weakness.  All other systems reviewed and are negative.   Physical Exam Updated Vital Signs BP 114/60 (BP Location: Right Arm)   Pulse (!) 55   Temp 98.2  F (36.8 C) (Oral)   Ht $R'5\' 2"'MR$  (1.575 m)   Wt 68.9 kg   SpO2 97%   BMI 27.78 kg/m   Physical Exam Vitals and nursing note reviewed.  Constitutional:      General: She is not in acute distress.    Appearance: Normal appearance. She is well-developed, normal weight and well-nourished.  HENT:     Head: Normocephalic and atraumatic.     Mouth/Throat:     Mouth: Mucous membranes are moist.  Eyes:     General: No visual field deficit.    Extraocular Movements: EOM normal.     Pupils: Pupils are equal, round, and reactive to light.  Cardiovascular:     Rate and Rhythm: Regular rhythm. Bradycardia present.     Pulses: Intact distal pulses.     Heart sounds: Normal heart sounds. No murmur heard. No friction rub.  Pulmonary:     Effort: Pulmonary effort is normal.     Breath sounds: Normal breath sounds. No wheezing or rales.  Abdominal:     General: Bowel sounds are normal. There is no distension.     Palpations: Abdomen is soft.     Tenderness: There is no abdominal tenderness. There is no guarding or rebound.  Musculoskeletal:        General: No tenderness. Normal range of motion.     Cervical back: Normal range of motion and neck supple.     Right lower leg: No edema.     Left lower leg: No edema.     Comments: No edema  Skin:    General:  Skin is warm and dry.     Capillary Refill: Capillary refill takes less than 2 seconds.     Findings: No rash.  Neurological:     Mental Status: She is alert and oriented to person, place, and time.     Cranial Nerves: Dysarthria and facial asymmetry present. No cranial nerve deficit.     Motor: Weakness present.     Comments: 4 out of 5 weakness in the right upper and lower extremity.  Pronator drift noted in the right upper extremity.  Left upper and lower extremity 5 out of 5 strength without pronator drift.  Left-sided facial droop but no notable visual field cuts.  Expressive aphasia noted.  Coordination intact.  Psychiatric:        Mood and Affect: Mood and affect and mood normal.        Behavior: Behavior normal.        Thought Content: Thought content normal.     ED Results / Procedures / Treatments   Labs (all labs ordered are listed, but only abnormal results are displayed) Labs Reviewed  CBC WITH DIFFERENTIAL/PLATELET - Abnormal; Notable for the following components:      Result Value   Abs Immature Granulocytes 0.21 (*)    All other components within normal limits  COMPREHENSIVE METABOLIC PANEL - Abnormal; Notable for the following components:   Glucose, Bld 168 (*)    Total Protein 5.5 (*)    Albumin 3.3 (*)    Alkaline Phosphatase 36 (*)    All other components within normal limits    EKG None  Radiology CT Head Wo Contrast  Result Date: 12/27/2020 CLINICAL DATA:  Brain mass or lesion with worsening symptoms EXAM: CT HEAD WITHOUT CONTRAST TECHNIQUE: Contiguous axial images were obtained from the base of the skull through the vertex without intravenous contrast. COMPARISON:  Is MRI from 3 days ago  a FINDINGS: Brain: Known brain masses, including dominant posterior left frontal mass measuring ~ 3 cm by CT with moderate regional vasogenic edema and a fluid fluid level. A smaller low-density mass is also seen along the higher and more anterior left frontal lobe. Small  high-density area in the high right frontal lobe which is chronic and tumoral by comparison brain MRI. No evidence of acute hemorrhage or infarct. No hydrocephalus or herniation. Vascular: No hyperdense vessel or unexpected calcification. Skull: Normal. Negative for fracture or focal lesion. 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 No detected change since MRI 3 days ago. Electronically Signed   By: Monte Fantasia M.D.   On: 12/27/2020 08:44    Procedures Procedures   Medications Ordered in ED Medications  dexamethasone (DECADRON) tablet 4 mg (has no administration in time range)  famotidine (PEPCID) tablet 20 mg (has no administration in time range)  levETIRAcetam (KEPPRA) tablet 500 mg (has no administration in time range)  levothyroxine (SYNTHROID) tablet 25 mcg (has no administration in time range)    ED Course  I have reviewed the triage vital signs and the nursing notes.  Pertinent labs & imaging results that were available during my care of the patient were reviewed by me and considered in my medical decision making (see chart for details).    MDM Rules/Calculators/A&P                          Elderly female with known brain metastases presenting today from home with concern for worsening right-sided deficits.  Patient has started Decadron 4 mg 3 times daily due to vasogenic edema and has plans for radiation and surgical removal this week.  She does have aphasia and right-sided weakness on exam.  Son who is at bedside reports that it is worse and she is having more difficulty getting around.  Blood sugar has been in the 200s but she has been using her insulin.  Also she has had not had a bowel movement in 5 days.  She has no abdominal pain at this time is well-appearing with normal vital signs.  Head CT shows no worsening of the vasogenic edema and it looks stable from MRI 3 days ago.  9:54 AM Patient's labs are  unchanged.  When attempting to get patient out of bed she can stand but does need a one-person assist and has a shuffling gait which son reports is a little bit better from last night but significantly worse from when she left the hospital.  Will discuss with oncology for further plan.  10:53 AM Spoke with Dr. Malena Peer and Lavella Hammock who feel that pt needs to proceed with current plan.  No change in steroids at this time or need for more emergent procedure.  Discussed with son and sister and pt and they are ok with this plan.  Will have a wheelchair delivered to pt's home tomorrow.  Will have pt monitor BP and HR as both on the lower side today without meds.  Pt on multiple BP meds and may be too much.  Family given return precautions.  MDM Number of Diagnoses or Management Options   Amount and/or Complexity of Data Reviewed Clinical lab tests: ordered and reviewed Tests in the radiology section of CPT: ordered and reviewed Tests in the medicine section of CPT: ordered and reviewed Decide to obtain previous medical records or to obtain history from someone other than the patient: yes Obtain  history from someone other than the patient: yes Review and summarize past medical records: yes Discuss the patient with other providers: yes Independent visualization of images, tracings, or specimens: yes  Risk of Complications, Morbidity, and/or Mortality Presenting problems: moderate Diagnostic procedures: minimal Management options: minimal  Patient Progress Patient progress: stable    Final Clinical Impression(s) / ED Diagnoses Final diagnoses:  Right sided weakness  Brain metastasis ([place])    Rx / DC Orders ED Discharge Orders    None       Blanchie Dessert, MD 12/27/20 1100

## 2020-12-27 NOTE — ED Triage Notes (Signed)
Patient arrived via EMS; from home. Concern for worsening weakness. Reported hx of brain ca. W/ surgical plans for 01/10/2021; Denies pain when asked; answers appropriately. Right sided weakness and slurred speech noted which are reported as baseline.

## 2020-12-27 NOTE — Discharge Instructions (Signed)
If blood pressure is less than 115/60 hold the hydralazine.  If heart rate is less then 55 hold the metoprolol.  Continue other medications as prescribed.  She received her keppra, decadron, pepcid and synthroid here today.  If she is having bowel issues and concern for constipation she can use colace or Dulcolax stool softeners.  If she develops headache, vomiting, changes in behavior, vision changes or new weakness on the left please return to the ER for re-evaluation.  Also a wheelchair should be delivered to your home tomorrow.  They will call you to arrange.

## 2020-12-27 NOTE — Progress Notes (Signed)
CSW spoke with Dr. Maryan Rued who states this patient needs a wheelchair - orders were placed.  CSW spoke with [employer] rep who states there are no wheelchairs available today but one can be delivered to the patient's home as early as tomorrow.   Madilyn Fireman, MSW, LCSW-A Transitions of Care  Clinical Social Worker I [phone]

## 2020-12-27 NOTE — Telephone Encounter (Signed)
error 

## 2020-12-28 ENCOUNTER — Other Ambulatory Visit: Payer: Self-pay | Admitting: Hematology and Oncology

## 2020-12-28 ENCOUNTER — Encounter: Payer: Self-pay | Admitting: Hematology and Oncology

## 2020-12-28 ENCOUNTER — Telehealth (HOSPITAL_BASED_OUTPATIENT_CLINIC_OR_DEPARTMENT_OTHER): Payer: Medicare Other | Admitting: Hematology and Oncology

## 2020-12-28 DIAGNOSIS — R12 Heartburn: Secondary | ICD-10-CM

## 2020-12-28 DIAGNOSIS — C55 Malignant neoplasm of uterus, part unspecified: Secondary | ICD-10-CM | POA: Diagnosis not present

## 2020-12-28 DIAGNOSIS — C7931 Secondary malignant neoplasm of brain: Secondary | ICD-10-CM

## 2020-12-28 DIAGNOSIS — E119 Type 2 diabetes mellitus without complications: Secondary | ICD-10-CM

## 2020-12-28 DIAGNOSIS — I10 Essential (primary) hypertension: Secondary | ICD-10-CM

## 2020-12-28 DIAGNOSIS — Z7189 Other specified counseling: Secondary | ICD-10-CM

## 2020-12-28 MED ORDER — PROCHLORPERAZINE MALEATE 10 MG PO TABS: 10.0000 mg | ORAL_TABLET | Freq: Four times a day (QID) | ORAL | 1 refills | Status: DC | PRN

## 2020-12-28 MED ORDER — METOPROLOL SUCCINATE ER 100 MG PO TB24: 100.0000 mg | ORAL_TABLET | Freq: Every day | ORAL | 1 refills | Status: DC

## 2020-12-28 MED ORDER — ONDANSETRON HCL 8 MG PO TABS: 8.0000 mg | ORAL_TABLET | Freq: Three times a day (TID) | ORAL | 1 refills | Status: DC | PRN

## 2020-12-28 MED ORDER — DEXAMETHASONE 4 MG PO TABS: 4.0000 mg | ORAL_TABLET | Freq: Three times a day (TID) | ORAL | 1 refills | Status: DC

## 2020-12-28 MED ORDER — CLONAZEPAM 0.5 MG PO TABS: 0.5000 mg | ORAL_TABLET | Freq: Every evening | ORAL | 3 refills | Status: DC | PRN

## 2020-12-28 MED FILL — ONDANSETRON HCL 8 MG TABLET: 8 | 10 days supply | Qty: 30 | Fill #0

## 2020-12-28 MED FILL — PROCHLORPERAZINE 10 MG TAB: 10 | 8 days supply | Qty: 30 | Fill #0

## 2020-12-28 MED FILL — METOPROLOL SUCCINATE ER 100: 100 | 30 days supply | Qty: 30 | Fill #0

## 2020-12-28 MED FILL — clonazePAM 0.5 MG TABS: 0.5 | 30 days supply | Qty: 30 | Fill #0

## 2020-12-28 NOTE — Assessment & Plan Note (Signed)
Her blood pressure is now low, since discontinuation of Lenvima Recommend the patient to continue metoprolol, losartan and Hytrin and to omit amlodipine and hydralazine due to intermittent bradycardia and low blood pressure We will establish a higher threshold of blood pressure goal She will not take extra blood pressure medication if her systolic blood pressure is less than 837 or diastolic blood pressure is less than 90

## 2020-12-28 NOTE — Progress Notes (Signed)
HEMATOLOGY-ONCOLOGY ELECTRONIC VISIT PROGRESS NOTE  Patient Care Team: [name], DO as PCP - General (Family Medicine)  I connected with  the patient through her sister's phone Her sister, brother and 3 sons are available to discuss management  ASSESSMENT & PLAN:  Uterine cancer [facility]) She is getting weaker Her whole right side is profoundly weak and she is bedbound She is able to use the left hand The patient is left-handed and perform limited activities of daily living of self-care such as brushing her hair, feeding herself, etc. 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 She is dependent on family on all activities of daily living and have difficulties with dysarthria and word finding difficulties We discussed the risk and benefits of treatment versus supportive care For now, I will adjust her blood pressure medications in case her recent worsening neurological deficit is related to profound changes in her blood pressure She will continue Keppra and dexamethasone  Essential hypertension Her blood pressure is now low, since discontinuation of Lenvima Recommend the patient to continue metoprolol, losartan and Hytrin and to omit amlodipine and hydralazine due to intermittent bradycardia and low blood pressure We will establish a higher threshold of blood pressure goal She will not take extra blood pressure medication if her systolic blood pressure is less than 500 or diastolic blood pressure is less than 90  Diabetes mellitus without complication, without long-term current use of insulin ([place]) She will continue insulin sliding scale  Goals of care, counseling/discussion I have a very long discussion  with the patient and family and we discussed prognosis and goals of care Ultimately, the patient and family needs more time to decide the next step I will call her sister tomorrow for final decision   No orders of the defined types were placed in this encounter.   INTERVAL HISTORY: Please see below for problem oriented charting. The purpose of today's virtual visit is to follow-up on discussion about plan of care She was recently discharged from the hospital but was brought back to the emergency department when she got weaker She had repeat CT imaging and was subsequently sent home due to stability of CT findings Her entire right side of her body is weak The patient is left-handed and managed to perform limited self-care She has worsening dysarthria and word finding difficulties She is almost completely bedbound It was noted that her blood pressure is not trending low Majority of her blood pressure readings are less than 370 systolic She is also intermittently bradycardic Her blood sugar is intermittently elevated, on average, over 200, rarely over 150 We spent over 70 minutes over the phone reviewing her medications, changing her medication and discuss about goals of care and future plan  SUMMARY OF ONCOLOGIC HISTORY: Oncology History Overview Note  Carcinosarcoma and serous, 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   01/18/2018 Imaging   CT scan  of the chest, abdomen and pelvis elsewhere show lung nodules, kidney lesion as well as 1.5 x 1 cm left, iliac lymph node   01/23/2018 - 06/12/2018 Chemotherapy   The patient  had adjuvant chemotherapy with carboplatin and taxol elsewhere for chemotherapy treatment.     06/25/2018 Imaging   CT scan of the chest, abdomen and pelvis showed persistent lung nodules, kidney lesion but reduced size of previously noted pelvic lymphadenopathy to 6 x 4 mm   07/09/2018 - 08/17/2018 Radiation Therapy   She had daily radiation treatment for 5 weeks elsewhere in [place], 28 days treatment, 5040 cGy   09/25/2018 Imaging   CT scan of the chest, abdomen and pelvis shows stable lung nodule and kidney lesion.  Previously noted enlarged lymphadenopathy in the external iliac region has resolved   01/16/2019 Imaging   CT imaging of the chest, abdomen and pelvis elsewhere showed stable kidney lesions but no evidence of cancer recurrence   05/16/2019 Imaging   Outside CT scan showed stable pulmonary nodules.  Bilateral simple renal cyst with an exophytic lesion in the medial cortex of the lower pole of the right kidney measure 10 mm with enhancement postcontrast.  There is 6 mm lymph node in the left para-aortic region.  There is an enlarging hyper enhancing aortocaval lymph node measuring 5 mm.   08/27/2019 PET scan   There is left supraclavicular lymph node measure 7 mm, another additional supraclavicular region 9 mm with SUV of 8.3.  There is abnormal uptake in retrocrural lymph nodes, SUV 3.7.  There is elevated FDG uptake in the patient's known retroperitoneal lymph nodes.  There is a small solid-appearing right inferior medial renal lesion with no FDG uptake.   09/13/2019 Procedure   She underwent biopsy of 2 left supraclavicular lymph node elsewhere   09/13/2019 Pathology Results   Immunohistochemical stains performed on the biopsy report showed that the tumor cells are strongly and diffusely positive for p16, PAX 8, p53, CK7, pancytokeratin and beta-catenin.  CA-125, CK20 and TTF-1 are negative.  Ki-67 stains approximately 50% of tumor cells.  Overall diagnosis showed that this is  metastatic adenocarcinoma consistent with previous primary.   10/09/2019 -  Chemotherapy   The patient had docetaxel and carboplatin for chemotherapy treatment elsewhere in [place].  Her treatment was initially given weekly.  Cycle 1 was complicated by neutropenia.  Cycle 2 was dose adjusted and delay.  Cycle 2 treatment was complicated by loss of appetite, severe diarrhea and persistent neutropenia.  Cycle 3 was delay as well.  She went to the emergency department twice for IV fluid hydration and have lost a lot of weight.    12/18/2019 Imaging   CT chest, abdomen and pelvis 1. Interval resolution of the left supraclavicular, retroperitoneal, and right retrocrural lymphadenopathy seen on previous diagnostic CT and PET-CT. Tiny lymph nodes are seen in the retroperitoneal region today. 2. Tiny bilateral pulmonary nodules, indeterminate. These do not appear substantially changed although we do not have a prior diagnostic CT chest to compare. Previous PET-CT is a free breathing exam which obscure small pulmonary nodules. Attention on follow-up recommended. 3. Bilateral interstitial and patchy peripheral ground-glass opacity in both lungs, nonspecific but potentially related to infectious/inflammatory process or scarring. 4. 10 mm enhancing lesion lower pole right kidney, suspicious for neoplasm. MRI without and with contrast recommended to further evaluate. 5. Small hiatal hernia. 6. Enlargement of the pulmonary outflow tract and main pulmonary arteries compatible with pulmonary arterial hypertension.  03/09/2020 Imaging   1. No findings suspicious for metastatic disease in the chest, abdomen or pelvis. 2. Small pulmonary nodules are all stable and warrant continued chest CT follow-up. 3. Stable indeterminate exophytic 1.2 cm renal cortical lesion in the lateral lower left kidney, renal cell carcinoma not excluded. Dedicated MRI abdomen without and with IV contrast may be obtained  for further evaluation as clinically warranted. 4. Chronic findings include: Stable dilated main pulmonary artery, suggesting pulmonary arterial hypertension. Small hiatal hernia. Mild left colonic diverticulosis. Two-vessel coronary atherosclerosis. Aortic Atherosclerosis (ICD10-I70.0).     06/04/2020 Imaging   1. Interval progression of small retroperitoneal lymph nodes. While not technically enlarged by CT criteria, the interval progression is concerning. The enlargement of these nodes is more apparent when comparing back to the older study from 12/18/2019 and they are now similar in size to the abnormal lymph nodes seen on the outside imaging of 12/03/2019. Close follow-up recommended. 2. Bilateral pulmonary nodules, similar to prior. The 6 mm right lower lobe nodule appears more confluent than on the prior study and is slightly increased from 5 mm when I remeasure on the previous exam. Close attention on follow-up recommended. 3. Small hiatal hernia. 4. Left colonic diverticulosis without diverticulitis. 5. Aortic Atherosclerosis (ICD10-I70.0).   08/31/2020 Imaging   1. Today's study is again concerning for progressive metastatic disease both with increased number and size of numerous small pulmonary nodules in the visualize lung bases, as well as increased number and size of numerous retroperitoneal lymph nodes, as detailed above. 2. Multiple cystic lesions in the kidneys bilaterally, favored to represent a combination of Bosniak class 1 and Bosniak class 2 cysts, as detailed above. 3. Colonic diverticulosis without evidence of acute diverticulitis at this time. 4. Cardiomegaly with left atrial dilatation. 5. Small hiatal hernia. 6. Aortic atherosclerosis.     09/15/2020 -  Chemotherapy    Patient is on Treatment Plan: UTERINE LENVATINIB + PEMBROLIZUMAB Q21D      11/30/2020 Imaging   1. Improving pulmonary metastatic disease. 2. Left hilar lymph nodes have increased in size slightly in  the interval. Subcentimeter abdominal retroperitoneal lymph nodes are stable. 3. Hyperattenuating lesions in the inferior aspects of both kidneys are too small to characterize. Further evaluation with pre and post contrast MRI should be considered. Pre and post contrast CT could alternatively be performed, but would likely be of decreased accuracy given lesion size. 4. Hepatic steatosis. 5. Aortic atherosclerosis (ICD10-I70.0). Coronary artery calcification. 6. Enlarged pulmonic trunk, indicative of pulmonary arterial hypertension.       REVIEW OF SYSTEMS:   Constitutional: Denies fevers, chills or abnormal weight loss Eyes: Denies blurriness of vision Ears, nose, mouth, throat, and face: Denies mucositis or sore throat Respiratory: Denies cough, dyspnea or wheezes Cardiovascular: Denies palpitation, chest discomfort Gastrointestinal:  Denies nausea, heartburn or change in bowel habits Skin: Denies abnormal skin rashes Lymphatics: Denies new lymphadenopathy or easy bruising All other systems were reviewed with the patient and are negative.  I have reviewed the past medical history, past surgical history, social history and family history with the patient and they are unchanged from previous note.  ALLERGIES:  is allergic to lisinopril, verapamil, and other.  MEDICATIONS:  Current Outpatient Medications  Medication Sig Dispense Refill  . amLODipine (NORVASC) 10 MG tablet Take 1 tablet (10 mg total) by mouth daily. 90 tablet 3  . blood glucose meter kit and supplies Dispense based on patient and insurance preference. Use up to four times daily as directed. (  FOR ICD-10 E10.9, E11.9). 1 each 0  . clonazePAM (KLONOPIN) 0.5 MG tablet Take 1 tablet (0.5 mg total) by mouth at bedtime as needed (sleep). 30 tablet 3  . dexamethasone (DECADRON) 4 MG tablet Take 1 tablet (4 mg total) by mouth 3 (three) times daily. 90 tablet 1  . famotidine (PEPCID) 20 MG tablet Take 1 tablet (20 mg total) by  mouth 2 (two) times daily. 60 tablet 0  . hydrALAZINE (APRESOLINE) 25 MG tablet Take 1 tablet (25 mg total) by mouth 3 (three) times daily. 270 tablet 3  . HYDROcodone-acetaminophen (NORCO/VICODIN) 5-325 MG tablet Take 1 tablet by mouth every 6 (six) hours as needed. (Patient taking differently: Take 1 tablet by mouth every 6 (six) hours as needed for moderate pain.) 60 tablet 0  . insulin aspart (NOVOLOG) 100 UNIT/ML FlexPen CBG 70 - 120: 0 units  CBG 121 - 150: 2 units  CBG 151 - 200: 3 units  CBG 201 - 250: 5 units  CBG 251 - 300: 8 units  CBG 301 - 350: 11 units  CBG 351 - 400: 15 units 15 mL 0  . Insulin Pen Needle (PEN NEEDLES 3/16") 31G X 5 MM MISC Use as directed with insulin pen 100 each 0  . levETIRAcetam (KEPPRA) 500 MG tablet Take 1 tablet (500 mg total) by mouth 2 (two) times daily. 60 tablet 0  . levothyroxine (SYNTHROID) 25 MCG tablet Take 1 tablet (25 mcg total) by mouth daily before breakfast. 30 tablet 11  . lidocaine-prilocaine (EMLA) cream Apply to affected area once 30 g 3  . losartan (COZAAR) 100 MG tablet Take 100 mg by mouth daily.    . metoprolol succinate (TOPROL-XL) 100 MG 24 hr tablet Take 1 tablet (100 mg total) by mouth daily. Take with or immediately following a meal. 30 tablet 1  . ondansetron (ZOFRAN) 8 MG tablet Take 1 tablet (8 mg total) by mouth every 8 (eight) hours as needed (Nausea or vomiting). 30 tablet 1  . pantoprazole (PROTONIX) 20 MG tablet Take 1 tablet (20 mg total) by mouth daily. 30 tablet 0  . prochlorperazine (COMPAZINE) 10 MG tablet Take 1 tablet (10 mg total) by mouth every 6 (six) hours as needed (Nausea or vomiting). 30 tablet 1  . rosuvastatin (CRESTOR) 10 MG tablet Take 1 tablet (10 mg total) by mouth daily. 90 tablet 2  . terazosin (HYTRIN) 2 MG capsule Take 1 capsule (2 mg total) by mouth at bedtime. 30 capsule 1   No current facility-administered medications for this visit.    PHYSICAL EXAMINATION: ECOG PERFORMANCE STATUS: 4 -  Bedbound  LABORATORY DATA:  I have reviewed the data as listed CMP Latest Ref Rng & Units 12/27/2020 12/25/2020 12/24/2020  Glucose 70 - 99 mg/dL 168(H) 172(H) 167(H)  BUN 8 - 23 mg/dL 21 25(H) 26(H)  Creatinine 0.44 - 1.00 mg/dL 0.86 0.88 1.13(H)  Sodium 135 - 145 mmol/L 138 139 137  Potassium 3.5 - 5.1 mmol/L 3.9 4.1 3.2(L)  Chloride 98 - 111 mmol/L 105 106 103  CO2 22 - 32 mmol/L _0 Calcium 8.9 - 10.3 mg/dL 9.6 9.6 9.8  Total Protein 6.5 - 8.1 g/dL 5.5(L) 5.7(L) -  Total Bilirubin 0.3 - 1.2 mg/dL 0.9 0.9 -  Alkaline Phos 38 - 126 U/L 36(L) 37(L) -  AST 15 - 41 U/L 20 17 -  ALT 0 - 44 U/L 20 14 -    Lab Results  Component Value Date  WBC 6.9 12/27/2020   HGB 13.8 12/27/2020   HCT 41.7 12/27/2020   MCV 95.4 12/27/2020   PLT 201 12/27/2020   NEUTROABS 5.7 12/27/2020     RADIOGRAPHIC STUDIES: I have personally reviewed the radiological images as listed and agreed with the findings in the report. CT Head Wo Contrast  Result Date: 12/27/2020 CLINICAL DATA:  Brain mass or lesion with worsening symptoms EXAM: CT HEAD WITHOUT CONTRAST TECHNIQUE: Contiguous axial images were obtained from the base of the skull through the vertex without intravenous contrast. COMPARISON:  Is MRI from 3 days ago a FINDINGS: Brain: Known brain masses, including dominant posterior left frontal mass measuring ~ 3 cm by CT with moderate regional vasogenic edema and a fluid fluid level. A smaller low-density mass is also seen along the higher and more anterior left frontal lobe. Small high-density area in the high right frontal lobe which is chronic and tumoral by comparison brain MRI. No evidence of acute hemorrhage or infarct. No hydrocephalus or herniation. Vascular: No hyperdense vessel or unexpected calcification. Skull: Normal. Negative for fracture or focal lesion. 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 No detected change since MRI 3  days ago. Electronically Signed   By: Monte Fantasia M.D.   On: 12/27/2020 08:44   MR BRAIN W WO CONTRAST  Result Date: 12/24/2020 CLINICAL DATA:  Brain/CNS neoplasm, staging. EXAM: MRI HEAD WITHOUT AND WITH CONTRAST TECHNIQUE: Multiplanar, multiecho pulse sequences of the brain and surrounding structures were obtained without and with intravenous contrast. CONTRAST:  57m GADAVIST GADOBUTROL 1 MMOL/ML IV SOLN COMPARISON:  Brain MRI 12/22/2020. FINDINGS: Brain: Cerebral volume is normal for age. 4.2 x 3.8 x 3.8 cm centrally necrotic mass with peripheral curvilinear and nodular enhancement within the left frontoparietal lobes and extending to the left temporoparietal junction, not significantly changed in size from the brain MRI of 12/22/2020 (series 9, image 142). Unchanged moderate surrounding vasogenic edema. Unchanged mass effect with effacement of the left lateral ventricle atrium. Notably, this mass involves the left pre and postcentral gyri. Anterior to this, there is a 1.4 x 1.6 x 1.7 cm mass within the left frontal lobe which has not significantly changed in size (series 9, image 164). This mass involves the left superior and possibly middle frontal gyri. As before, this mass demonstrates precontrast T1 hyperintensity and SWI signal loss consistent with nonacute hemorrhage. Precontrast T1 hyperintensity limits evaluation for enhancement. Mild surrounding vasogenic edema, unchanged.The previously questioned satellite lesion along the left anterolateral aspect appears to reflect vascular enhancement. Unchanged 5 mm enhancing cortical lesion within the mid right frontal lobe (series 9, image 153). Corresponding precontrast T1 hyperintensity and SWI signal loss at this site consistent with nonacute blood products. No new intracranial metastasis is identified. As before, there is some restricted diffusion associated with the lesions. However, there is no evidence of acute infarction. Background mild  multifocal T2/FLAIR hyperintensity within the cerebral white matter is nonspecific, but compatible with chronic small vessel ischemic disease. No extra-axial fluid collection. No midline shift. Vascular: Expected proximal arterial flow voids. Non dominant intracranial left vertebral artery. Right temporal lobe developmental venous anomaly. Skull and upper cervical spine: No focal marrow lesion. Sinuses/Orbits: Visualized orbits show no acute finding. Small mucous retention cysts and mild mucosal thickening within the bilateral maxillary sinuses. Other: Nonspecific 8 mm enhancing lesion within the right temporal scalp (series 9, image 139). Nonspecific 1.8 x 1.9 cm enhancing lesion within the right parietal scalp (series 9, image 188). IMPRESSION:  3 parenchymal lesions within the bilateral cerebral hemispheres are unchanged from the brain MRI of 12/22/2020 and likely reflect metastases. The largest lesion measures 4.2 x 3.8 x 3.6 cm and is located within the left frontoparietal lobes with extension to the left temporoparietal junction. Unchanged moderate surrounding vasogenic edema with regional mass effect and partial effacement of the left lateral ventricle atrium. Notably, this dominant lesion balls the left pre and postcentral gyri. A previously questioned additional punctate metastasis within the left frontal lobe appears to reflect vascular enhancement. Nonspecific enhancing lesions measuring 8 mm within the right temporal scalp and measuring 1.8 x 1.9 cm within the right parietal scalp. Tumor deposits cannot be excluded. Direct visualization recommended. Additionally, direct tissue sampling can be considered. Electronically Signed   By: Kellie Simmering DO   On: 12/24/2020 13:37   MR Brain W and Wo Contrast  Result Date: 12/22/2020 CLINICAL DATA:  Metastatic disease evaluation. Additional provided: Slurred speech, generalized weakness as of today. Additional history obtained from [place] History of metastatic uterine cancer. EXAM: MRI HEAD WITHOUT AND WITH CONTRAST TECHNIQUE: Multiplanar, multiecho pulse sequences of the brain and surrounding structures were obtained without and with intravenous contrast. CONTRAST:  29m GADAVIST GADOBUTROL 1 MMOL/ML IV SOLN COMPARISON:  No pertinent prior exams available for comparison. FINDINGS: Brain: Mild intermittent motion degradation, limiting evaluation. This includes mild motion degradation of the T1 weighted postcontrast sequences. Cerebral volume is normal for age. There are multiple enhancing parental lesions likely reflecting intracranial metastatic disease, as follows. Within the left frontoparietal lobes and extending to the left temporoparietal junction, there is a centrally necrotic mass with peripheral curvilinear and nodular enhancement measuring 4.2 x 3.8 x 3.6 cm (series 15, image 39). Moderate surrounding vasogenic edema. Notably, this mass involves the left pre and postcentral gyri. Associated mass effect results in partial effacement of the atrium of the left lateral ventricle. More anteriorly within the left frontal lobe, there is a 1.4 x 1.6 x 1.6 cm lesion within the left superior frontal, and possibly middle frontal, gyri. Precontrast T1 hyperintensity limits evaluation for enhancement. Additionally, there is SWI signal loss associated with this lesion and findings likely reflect nonacute hemorrhage. Mild surrounding vasogenic edema. There is a punctate satellite lesion along its anterolateral aspect (series 15, image 45). 5 mm enhancing lesion within the cortex of the mid right frontal lobe (series 15, image 42). No significant surrounding edema. Some restricted diffusion is associated with the metastatic lesions. However, there is no evidence of acute infarction. Background mild multifocal T2/FLAIR hyperintensity within the cerebral white matter is nonspecific, but compatible with chronic small vessel ischemic disease. There are a few  punctate foci of SWI signal loss within the bilateral cerebral hemispheres compatible with nonspecific chronic microhemorrhages. No extra-axial fluid collection. No midline shift. Vascular: Expected proximal arterial flow voids. Skull and upper cervical spine: No focal marrow lesion Sinuses/Orbits: Visualized orbits show no acute finding. Mild bilateral ethmoid and maxillary sinus mucosal thickening. Other: Nonspecific 1.8 x 1.9 cm right parietal scalp lesion (series 8, image 25) (series 14, image 11). IMPRESSION: The examination is motion degraded and this may obscure small parenchymal lesions. Four parenchymal lesions are identified within the bilateral cerebral hemispheres, as described and likely reflecting intracranial metastatic disease. The largest lesion measures 4.2 x 3.8 x 3.6 cm and is located within the left frontoparietal lobes with extension to the left temporoparietal junction. Moderate surrounding vasogenic edema. Regional mass effect with partial effacement of the left lateral ventricle atrium.  Notably, this dominant lesion involves the left pre and postcentral gyri. 1.8 x 1.9 cm nonspecific right parietal scalp lesion. Direct visualization is recommended. Mild cerebral white matter chronic small vessel ischemic disease. Mild paranasal sinus disease as described. Electronically Signed   By: Kellie Simmering DO   On: 12/22/2020 21:51   CT CHEST ABDOMEN PELVIS W CONTRAST  Result Date: 11/30/2020 CLINICAL DATA:  Restaging uterine cancer. Chemotherapy and radiation therapy complete. Ongoing immunotherapy. EXAM: CT CHEST, ABDOMEN, AND PELVIS WITH CONTRAST TECHNIQUE: Multidetector CT imaging of the chest, abdomen and pelvis was performed following the standard protocol during bolus administration of intravenous contrast. CONTRAST:  125m OMNIPAQUE IOHEXOL 300 MG/ML  SOLN COMPARISON:  CT abdomen pelvis 08/31/2020 and CT chest 03/09/2020. FINDINGS: CT CHEST FINDINGS Cardiovascular: Right IJ Port-A-Cath  terminates in the low SVC. Atherosclerotic calcification of the aorta and coronary arteries. Pulmonic trunk and heart are enlarged. No pericardial effusion. Mediastinum/Nodes: Low right paratracheal lymph node measures 7 mm (2/25), stable. Left hilar lymph nodes measure up to 1.3 cm (2/26), increased from 8 mm. No axillary adenopathy. Esophagus is grossly unremarkable. Lungs/Pleura: Some pulmonary nodules have resolved in the interval. For example, a previously seen 8 mm lateral right lower lobe nodule (6/111) has resolved when compared with 08/31/2020. Other small pulmonary nodules are stable, measuring up to 6 mm in the lateral right lower lobe (6/114). No pleural fluid. Airway is unremarkable. Musculoskeletal: Postoperative changes in the proximal right humerus. Two faint areas of sclerosis in the proximal humerus (6/10) are unchanged. Mild compression of the T2 vertebral body is unchanged. CT ABDOMEN PELVIS FINDINGS Hepatobiliary: Liver is decreased in attenuation diffusely. Liver is otherwise unremarkable. Cholecystectomy. No biliary ductal dilatation. Pancreas: Negative. Spleen: Negative. Adrenals/Urinary Tract: Adrenal glands are unremarkable. Low-attenuation lesions in the kidneys measure up to 2.8 cm on the right and are indicative of cysts. A 10 mm hyperdense lesion off the lower pole right kidney (2/75) and a 1.3 cm hyperdense lesion off the lower pole left kidney appear unchanged but difficult to further characterize due to size and lack of precontrast imaging. Ureters are decompressed. Small amount of air in the bladder is presumably iatrogenic. Stomach/Bowel: Small hiatal hernia. Stomach, small bowel, appendix and colon are otherwise unremarkable. Vascular/Lymphatic: Atherosclerotic calcification of the aorta without aneurysm. No pathologically enlarged lymph nodes. Specifically, abdominal retroperitoneal lymph nodes are subcentimeter in size, as before. Reproductive: Hysterectomy.  No adnexal mass.  Other: No free fluid.  Mesenteries and peritoneum are unremarkable. Musculoskeletal: Degenerative changes in the spine. No worrisome lytic or sclerotic lesions. Degenerative changes in the hips. Mild levoconvex scoliosis of the lumbar spine. IMPRESSION: 1. Improving pulmonary metastatic disease. 2. Left hilar lymph nodes have increased in size slightly in the interval. Subcentimeter abdominal retroperitoneal lymph nodes are stable. 3. Hyperattenuating lesions in the inferior aspects of both kidneys are too small to characterize. Further evaluation with pre and post contrast MRI should be considered. Pre and post contrast CT could alternatively be performed, but would likely be of decreased accuracy given lesion size. 4. Hepatic steatosis. 5. Aortic atherosclerosis (ICD10-I70.0). Coronary artery calcification. 6. Enlarged pulmonic trunk, indicative of pulmonary arterial hypertension. Electronically Signed   By: MLorin PicketM.D.   On: 11/30/2020 11:21    I discussed the assessment and treatment plan with the patient. The patient was provided an opportunity to ask questions and all were answered. The patient agreed with the plan and demonstrated an understanding of the instructions. The patient was advised to call back or  seek an in-person evaluation if the symptoms worsen or if the condition fails to improve as anticipated.    I spent 80 minutes for the appointment reviewing test results, discuss management and coordination of care.  Heath Lark, MD 12/28/2020 3:35 PM

## 2020-12-28 NOTE — Assessment & Plan Note (Signed)
I have a very long discussion with the patient and family and we discussed prognosis and goals of care Ultimately, the patient and family needs more time to decide the next step I will call her sister tomorrow for final decision

## 2020-12-28 NOTE — Assessment & Plan Note (Signed)
She is getting weaker Her whole right side is profoundly weak and she is bedbound She is able to use the left hand The patient is left-handed and perform limited activities of daily living of self-care such as brushing her hair, feeding herself, etc. She has no swallowing difficulties We discussed the risk and benefits of pursuing radiation therapy and surgical resection The patient and family is undecided I will call her sister early tomorrow for further plan of care

## 2020-12-28 NOTE — Assessment & Plan Note (Signed)
She has progressive neurological deficit with right hemiparesis She is dependent on family on all activities of daily living and have difficulties with dysarthria and word finding difficulties We discussed the risk and benefits of treatment versus supportive care For now, I will adjust her blood pressure medications in case her recent worsening neurological deficit is related to profound changes in her blood pressure She will continue Keppra and dexamethasone

## 2020-12-28 NOTE — Assessment & Plan Note (Signed)
She will continue insulin sliding scale

## 2020-12-29 ENCOUNTER — Ambulatory Visit
Admission: RE | Admit: 2020-12-29 | Discharge: 2020-12-29 | Disposition: A | Discharge status: Home / Self Care | Payer: Medicare Other | Source: Ambulatory Visit | Attending: Radiation Oncology | Admitting: Radiation Oncology

## 2020-12-29 ENCOUNTER — Other Ambulatory Visit: Payer: Self-pay

## 2020-12-29 ENCOUNTER — Telehealth: Payer: Self-pay | Admitting: *Deleted

## 2020-12-29 ENCOUNTER — Encounter (HOSPITAL_COMMUNITY): Payer: Self-pay | Admitting: Anesthesiology

## 2020-12-29 ENCOUNTER — Telehealth: Payer: Self-pay | Admitting: Hematology and Oncology

## 2020-12-29 NOTE — Anesthesia Preprocedure Evaluation (Addendum)
Anesthesia Evaluation  Patient identified by MRN, date of birth, ID band Patient awake    Reviewed: Allergy & Precautions, NPO status , Patient's Chart, lab work & pertinent test results  History of Anesthesia Complications (+) PONV and history of anesthetic complications  Airway Mallampati: II  TM Distance: >3 FB Neck ROM: Full  Mouth opening: Limited Mouth Opening  Dental no notable dental hx.    Pulmonary neg pulmonary ROS,    Pulmonary exam normal breath sounds clear to auscultation       Cardiovascular hypertension, Normal cardiovascular exam Rhythm:Regular Rate:Normal     Neuro/Psych  Neuromuscular disease (peripheral neuropathy) negative psych ROS   GI/Hepatic   Endo/Other  diabetes, Type 2, Insulin DependentHypothyroidism   Renal/GU      Musculoskeletal negative musculoskeletal ROS (+)   Abdominal Normal abdominal exam  (+)   Peds  Hematology negative hematology ROS (+)   Anesthesia Other Findings Uterine cancer with mets Left facial droop; difficulty with word finding and speaking.   Reproductive/Obstetrics negative OB ROS                            Anesthesia Physical Anesthesia Plan  ASA: IV  Anesthesia Plan: General   Post-op Pain Management:    Induction: Intravenous  PONV Risk Score and Plan: 3 and Midazolam, Ondansetron, Dexamethasone, Treatment may vary due to age or medical condition and TIVA  Airway Management Planned: Oral ETT and Video Laryngoscope Planned  Additional Equipment: Arterial line  Intra-op Plan:   Post-operative Plan: Extubation in OR  Informed Consent: I have reviewed the patients History and Physical, chart, labs and discussed the procedure including the risks, benefits and alternatives for the proposed anesthesia with the patient or authorized representative who has indicated his/her understanding and acceptance.     Dental advisory  given  Plan Discussed with: CRNA and Anesthesiologist  Anesthesia Plan Comments: (2 PIV. + arterial line. GETA. TIVA)       Anesthesia Quick Evaluation

## 2020-12-29 NOTE — Progress Notes (Signed)
SDW INSTRUCTIONS:  Your procedure is scheduled on 01/22/2021. Please report to [facility] Main Entrance "A" at 06:30 A.M., and check in at the Admitting office. Call this number if you have problems the morning of surgery: [phone]   Remember: Do not eat or drink after midnight the night before your surgery   Medications to take morning of surgery with a sip of water include: amLODipine (NORVASC) dexamethasone (DECADRON)  famotidine (PEPCID)  hydrALAZINE (APRESOLINE) if within order  levETIRAcetam (KEPPRA)  levothyroxine (SYNTHROID)  metoprolol succinate (TOPROL-XL)  pantoprazole (PROTONIX) rosuvastatin (CRESTOR)  If needed: HYDROcodone-acetaminophen (NORCO/VICODIN) ondansetron (ZOFRAN)  prochlorperazine (COMPAZINE)     insulin aspart (NOVOLOG)  - if your CBG is >220 take half of your normal correctional dose.   ** PLEASE check your blood sugar the morning of your surgery when you wake up and every 2 hours until you get to the Short Stay unit.  If your blood sugar is less than 70 mg/dL, you will need to treat for low blood sugar: - Do not take insulin. - Treat a low blood sugar (less than 70 mg/dL) with  cup of clear juice (cranberry or apple), 4 glucose tablets, OR glucose gel. - Recheck blood sugar in 15 minutes after treatment (to make sure it is greater than 70 mg/dL). If your blood sugar is not greater than 70 mg/dL on recheck, call [phone] for further instructions.    As of today, STOP taking any Aspirin (unless otherwise instructed by your surgeon), Aleve, Naproxen, Ibuprofen, Motrin, Advil, Goody's, BC's, all herbal medications, fish oil, and all vitamins.    The Morning of Surgery Do not wear jewelry, make-up or nail polish. Do not wear lotions, powders, or perfumes, or deodorant Do not shave 48 hours prior to surgery.   Do not bring valuables to the hospital. [facility] is not responsible for any belongings or valuables. If you are a smoker, DO NOT Smoke  24 hours prior to surgery If you wear a CPAP at night please bring your mask the morning of surgery  Remember that you must have someone to transport you home after your surgery, and remain with you for 24 hours if you are discharged the same day. Please bring cases for contacts, glasses, hearing aids, dentures or bridgework because it cannot be worn into surgery.   Patients discharged the day of surgery will not be allowed to drive home.   Please shower the NIGHT BEFORE SURGERY and the MORNING OF SURGERY with DIAL Soap. Wear comfortable clothes the morning of surgery. Oral Hygiene is also important to reduce your risk of infection.  Remember - BRUSH YOUR TEETH THE MORNING OF SURGERY WITH YOUR REGULAR TOOTHPASTE  Patient denies shortness of breath, fever, cough and chest pain.

## 2020-12-29 NOTE — Telephone Encounter (Signed)
  Fuller Mandril, RN, BSN, [place] [phone] Pt qualifies for DME wheelchair.  DME  ordered through [facility].  Freda Munro of [facility] notified to deliver wheelchair to sister's address: [address] [place]  .

## 2020-12-29 NOTE — Telephone Encounter (Signed)
I spoke with her sister this morning The patient wants to move forward with SRS and surgical resection as planned I told her sister I will reach out to her within a week or 2 once pathology from surgical resection is available and we will see her within 3-1/2 weeks of surgery to plan future chemotherapy

## 2020-12-29 NOTE — Op Note (Signed)
Name: Sandra Arroyo    MRN: [id number]   Date: 12/29/2020    DOB: [birth year]   STEREOTACTIC RADIOSURGERY OPERATIVE NOTE  PRE-OPERATIVE DIAGNOSIS:  Uterine cancer with metastatic brain disease  POST-OPERATIVE DIAGNOSIS:  Uterine cancer with metastatic brain disease  PROCEDURE:   1. Stereotactic Radiosurgery using TrueBeam Linac device to complex left parietal 4.6 cm lesion 2. Stereotactic Radiosurgery using TrueBeam Linac device to simple left frontal lesion 3. Stereotactic Radiosurgery using TrueBeam Linac device to simple right frontal lesion  SURGEON:  Duffy Rhody, MD  RADIATION ONCOLOGIST: Kyung Rudd, MD  TECHNIQUE:  The patient underwent a radiation treatment planning session in the radiation oncology simulation suite under the care of the radiation oncology physician and physicist.  I participated closely in the radiation treatment planning afterwards. The patient underwent planning CT which was fused to 3T high resolution MRI with 1 mm axial slices.  These images were fused on the planning system.  We contoured the gross target volumes for all 3 visualized metastases and subsequently expanded each of these by 1 mm to yield three Planning Target Volumes. I actively participated in the planning process.  I helped to define and review the target contours and also the contours of the optic pathway, eyes, brainstem and selected nearby organs at risk.  All the dose constraints for critical structures were reviewed and compared to AAPM Task Group 101.  The prescription dose conformity was reviewed.  I approved the plan electronically.    Accordingly, Sandra Arroyo  was brought to the TrueBeam stereotactic radiation treatment linac and placed in the custom immobilization mask.  The patient was aligned according to the IR fiducial markers with BrainLab Exactrac, then orthogonal x-rays were used in ExacTrac with the 6DOF robotic table and the shifts were made to align the patient  Sandra Arroyo  received stereotactic radiosurgery to a prescription dose of 14 Gy to the large lesion, and 20 Gy to the two smaller lesions.  The detailed description of the procedure is recorded in the radiation oncology procedure note.  I was present for the duration of the procedure.  DISPOSITION:   Following delivery, the patient was transported to nursing in stable condition and monitored for possible acute effects to be discharged to home in stable condition with plan for resective surgery tomorrow morning.  Duffy Rhody, MD [facility]

## 2020-12-29 NOTE — Progress Notes (Signed)
Patient had SRS to brain today.  She was observed for 30 minutes.  Vitals assessed at beginning and at end of timeframe.  Denies nausea/ vomiting/headache/dizziness.   Vitals:   12/29/20 1700  BP: (!) 168/96  Pulse: (!) 56  Resp: 18  SpO2: 100%   Vitals:   12/29/20 1700 12/29/20 1728 12/29/20 1729  BP: (!) 168/96 (!) 179/103 (!) 142/78  Pulse: (!) 56 (!) 53   Resp: 18 18   SpO2: 100% 99%

## 2020-12-30 ENCOUNTER — Encounter (HOSPITAL_COMMUNITY): Admission: RE | Disposition: E | Payer: Self-pay | Source: Home / Self Care | Attending: Neurosurgery

## 2020-12-30 ENCOUNTER — Inpatient Hospital Stay (HOSPITAL_COMMUNITY): Payer: Medicare Other | Admitting: Certified Registered Nurse Anesthetist

## 2020-12-30 ENCOUNTER — Encounter: Payer: Self-pay | Admitting: Radiation Oncology

## 2020-12-30 ENCOUNTER — Inpatient Hospital Stay (HOSPITAL_COMMUNITY)
Admission: RE | Admit: 2020-12-30 | Discharge: 2021-01-26 | DRG: 025 | Disposition: E | Payer: Medicare Other | Attending: Neurosurgery | Admitting: Neurosurgery

## 2020-12-30 ENCOUNTER — Encounter (HOSPITAL_COMMUNITY): Payer: Self-pay

## 2020-12-30 ENCOUNTER — Other Ambulatory Visit: Payer: Self-pay | Admitting: Radiation Therapy

## 2020-12-30 DIAGNOSIS — Z794 Long term (current) use of insulin: Secondary | ICD-10-CM

## 2020-12-30 DIAGNOSIS — Z823 Family history of stroke: Secondary | ICD-10-CM | POA: Diagnosis not present

## 2020-12-30 DIAGNOSIS — R4701 Aphasia: Secondary | ICD-10-CM | POA: Diagnosis present

## 2020-12-30 DIAGNOSIS — E1142 Type 2 diabetes mellitus with diabetic polyneuropathy: Secondary | ICD-10-CM | POA: Diagnosis present

## 2020-12-30 DIAGNOSIS — Z8542 Personal history of malignant neoplasm of other parts of uterus: Secondary | ICD-10-CM | POA: Diagnosis not present

## 2020-12-30 DIAGNOSIS — C778 Secondary and unspecified malignant neoplasm of lymph nodes of multiple regions: Secondary | ICD-10-CM | POA: Diagnosis present

## 2020-12-30 DIAGNOSIS — G620 Drug-induced polyneuropathy: Secondary | ICD-10-CM | POA: Diagnosis present

## 2020-12-30 DIAGNOSIS — C7931 Secondary malignant neoplasm of brain: Principal | ICD-10-CM | POA: Diagnosis present

## 2020-12-30 DIAGNOSIS — Z20822 Contact with and (suspected) exposure to covid-19: Secondary | ICD-10-CM | POA: Diagnosis present

## 2020-12-30 DIAGNOSIS — Z91048 Other nonmedicinal substance allergy status: Secondary | ICD-10-CM | POA: Diagnosis not present

## 2020-12-30 DIAGNOSIS — Z95828 Presence of other vascular implants and grafts: Secondary | ICD-10-CM | POA: Diagnosis not present

## 2020-12-30 DIAGNOSIS — G936 Cerebral edema: Secondary | ICD-10-CM | POA: Diagnosis present

## 2020-12-30 DIAGNOSIS — C7800 Secondary malignant neoplasm of unspecified lung: Secondary | ICD-10-CM | POA: Diagnosis present

## 2020-12-30 DIAGNOSIS — Z7952 Long term (current) use of systemic steroids: Secondary | ICD-10-CM | POA: Diagnosis not present

## 2020-12-30 DIAGNOSIS — Z79899 Other long term (current) drug therapy: Secondary | ICD-10-CM | POA: Diagnosis not present

## 2020-12-30 DIAGNOSIS — E039 Hypothyroidism, unspecified: Secondary | ICD-10-CM | POA: Diagnosis present

## 2020-12-30 DIAGNOSIS — G8191 Hemiplegia, unspecified affecting right dominant side: Secondary | ICD-10-CM | POA: Diagnosis present

## 2020-12-30 DIAGNOSIS — Z8043 Family history of malignant neoplasm of testis: Secondary | ICD-10-CM | POA: Diagnosis not present

## 2020-12-30 DIAGNOSIS — Z833 Family history of diabetes mellitus: Secondary | ICD-10-CM

## 2020-12-30 DIAGNOSIS — Z803 Family history of malignant neoplasm of breast: Secondary | ICD-10-CM | POA: Diagnosis not present

## 2020-12-30 DIAGNOSIS — I10 Essential (primary) hypertension: Secondary | ICD-10-CM | POA: Diagnosis present

## 2020-12-30 DIAGNOSIS — Z888 Allergy status to other drugs, medicaments and biological substances status: Secondary | ICD-10-CM

## 2020-12-30 DIAGNOSIS — Z515 Encounter for palliative care: Secondary | ICD-10-CM

## 2020-12-30 DIAGNOSIS — E892 Postprocedural hypoparathyroidism: Secondary | ICD-10-CM | POA: Diagnosis present

## 2020-12-30 DIAGNOSIS — Z79890 Hormone replacement therapy (postmenopausal): Secondary | ICD-10-CM | POA: Diagnosis not present

## 2020-12-30 DIAGNOSIS — Z9889 Other specified postprocedural states: Secondary | ICD-10-CM

## 2020-12-30 DIAGNOSIS — Z66 Do not resuscitate: Secondary | ICD-10-CM | POA: Diagnosis not present

## 2020-12-30 DIAGNOSIS — Z7189 Other specified counseling: Secondary | ICD-10-CM | POA: Diagnosis not present

## 2020-12-30 DIAGNOSIS — R29810 Facial weakness: Secondary | ICD-10-CM | POA: Diagnosis present

## 2020-12-30 HISTORY — PX: APPLICATION OF CRANIAL NAVIGATION: SHX6578

## 2020-12-30 HISTORY — PX: CRANIOTOMY: SHX93

## 2020-12-30 HISTORY — DX: Nausea with vomiting, unspecified: R11.2

## 2020-12-30 HISTORY — DX: Family history of other specified conditions: Z84.89

## 2020-12-30 HISTORY — DX: Other specified postprocedural states: Z98.890

## 2020-12-30 LAB — TYPE AND SCREEN
ABO/RH(D): O POS
Antibody Screen: NEGATIVE

## 2020-12-30 LAB — BASIC METABOLIC PANEL
Anion gap: 11 (ref 5–15)
BUN: 27 mg/dL — ABNORMAL HIGH (ref 8–23)
CO2: 24 mmol/L (ref 22–32)
Calcium: 9.9 mg/dL (ref 8.9–10.3)
Chloride: 101 mmol/L (ref 98–111)
Creatinine, Ser: 0.92 mg/dL (ref 0.44–1.00)
GFR, Estimated: >60 mL/min (ref >60)
Glucose, Bld: 175 mg/dL — ABNORMAL HIGH (ref 70–99)
Potassium: 4.6 mmol/L (ref 3.5–5.1)
Sodium: 136 mmol/L (ref 135–145)

## 2020-12-30 LAB — GLUCOSE, CAPILLARY
Glucose-Capillary: 116 mg/dL — ABNORMAL HIGH (ref 70–99)
Glucose-Capillary: 146 mg/dL — ABNORMAL HIGH (ref 70–99)
Glucose-Capillary: 147 mg/dL — ABNORMAL HIGH (ref 70–99)
Glucose-Capillary: 165 mg/dL — ABNORMAL HIGH (ref 70–99)

## 2020-12-30 LAB — CBC
HCT: 44.1 % (ref 36.0–46.0)
Hemoglobin: 14.3 g/dL (ref 12.0–15.0)
MCH: 31.4 pg (ref 26.0–34.0)
MCHC: 32.4 g/dL (ref 30.0–36.0)
MCV: 96.7 fL (ref 80.0–100.0)
Platelets: 184 10*3/uL (ref 150–400)
RBC: 4.56 MIL/uL (ref 3.87–5.11)
RDW: 14.1 % (ref 11.5–15.5)
WBC: 8.5 10*3/uL (ref 4.0–10.5)
nRBC: 0.2 % (ref 0.0–0.2)

## 2020-12-30 LAB — MRSA PCR SCREENING: MRSA by PCR: NEGATIVE

## 2020-12-30 LAB — SARS CORONAVIRUS 2 BY RT PCR (HOSPITAL ORDER, PERFORMED IN ~~LOC~~ HOSPITAL LAB): SARS Coronavirus 2: NEGATIVE

## 2020-12-30 LAB — ABO/RH: ABO/RH(D): O POS

## 2020-12-30 SURGERY — CRANIOTOMY TUMOR EXCISION
Anesthesia: General | Site: Head

## 2020-12-30 MED ORDER — OXYCODONE HCL 5 MG/5ML PO SOLN: 5.0000 mg | Freq: Once | ORAL | Status: DC | PRN

## 2020-12-30 MED ORDER — PHENYLEPHRINE 40 MCG/ML (10ML) SYRINGE FOR IV PUSH (FOR BLOOD PRESSURE SUPPORT)
PREFILLED_SYRINGE | INTRAVENOUS | Status: AC
  Filled 2020-12-30: qty 10

## 2020-12-30 MED ORDER — BACITRACIN ZINC 500 UNIT/GM EX OINT
TOPICAL_OINTMENT | CUTANEOUS | Status: DC | PRN
  Administered 2020-12-30: 1 via TOPICAL

## 2020-12-30 MED ORDER — ONDANSETRON HCL 4 MG/2ML IJ SOLN: 4.0000 mg | Freq: Once | INTRAMUSCULAR | Status: DC | PRN

## 2020-12-30 MED ORDER — TERAZOSIN HCL 1 MG PO CAPS
2.0000 mg | ORAL_CAPSULE | Freq: Every day | ORAL | Status: DC
  Administered 2020-12-31 – 2021-01-06 (×7): 2 mg via ORAL
  Filled 2020-12-30 (×4): qty 2
  Filled 2020-12-30 (×2): qty 1
  Filled 2020-12-30 (×5): qty 2
  Filled 2020-12-30: qty 1

## 2020-12-30 MED ORDER — PROPOFOL 10 MG/ML IV BOLUS
INTRAVENOUS | Status: DC | PRN
  Administered 2020-12-30: 120 mg via INTRAVENOUS
  Administered 2020-12-30: 80 mg via INTRAVENOUS

## 2020-12-30 MED ORDER — ROCURONIUM BROMIDE 10 MG/ML (PF) SYRINGE
PREFILLED_SYRINGE | INTRAVENOUS | Status: AC
  Filled 2020-12-30: qty 10

## 2020-12-30 MED ORDER — PROCHLORPERAZINE MALEATE 10 MG PO TABS
10.0000 mg | ORAL_TABLET | Freq: Four times a day (QID) | ORAL | Status: DC | PRN
Start: 2020-12-30 — End: 2021-01-10
  Filled 2020-12-30: qty 1

## 2020-12-30 MED ORDER — WHITE PETROLATUM EX OINT
TOPICAL_OINTMENT | CUTANEOUS | Status: AC
  Administered 2020-12-30: 1
  Filled 2020-12-30: qty 28.35

## 2020-12-30 MED ORDER — LEVETIRACETAM IN NACL 500 MG/100ML IV SOLN
500.0000 mg | Freq: Two times a day (BID) | INTRAVENOUS | Status: DC
  Administered 2020-12-30 – 2020-12-31 (×2): 500 mg via INTRAVENOUS
  Filled 2020-12-30 (×2): qty 100

## 2020-12-30 MED ORDER — LACTATED RINGERS IV SOLN: INTRAVENOUS | Status: DC

## 2020-12-30 MED ORDER — ONDANSETRON HCL 4 MG PO TABS: 4.0000 mg | ORAL_TABLET | ORAL | Status: DC | PRN

## 2020-12-30 MED ORDER — ENOXAPARIN SODIUM 40 MG/0.4ML ~~LOC~~ SOLN
40.0000 mg | SUBCUTANEOUS | Status: DC
  Administered 2020-12-31 – 2021-01-07 (×8): 40 mg via SUBCUTANEOUS
  Filled 2020-12-30 (×8): qty 0.4

## 2020-12-30 MED ORDER — FENTANYL CITRATE (PF) 100 MCG/2ML IJ SOLN
INTRAMUSCULAR | Status: DC | PRN
  Administered 2020-12-30: 25 ug via INTRAVENOUS
  Administered 2020-12-30: 50 ug via INTRAVENOUS

## 2020-12-30 MED ORDER — PHENYLEPHRINE HCL-NACL 10-0.9 MG/250ML-% IV SOLN
INTRAVENOUS | Status: DC | PRN
  Administered 2020-12-30: 20 ug/min via INTRAVENOUS

## 2020-12-30 MED ORDER — DEXAMETHASONE SODIUM PHOSPHATE 10 MG/ML IJ SOLN
INTRAMUSCULAR | Status: DC | PRN
  Administered 2020-12-30: 10 mg via INTRAVENOUS

## 2020-12-30 MED ORDER — HYDRALAZINE HCL 25 MG PO TABS
25.0000 mg | ORAL_TABLET | Freq: Three times a day (TID) | ORAL | Status: DC
  Administered 2020-12-31 – 2021-01-07 (×21): 25 mg via ORAL
  Filled 2020-12-30 (×22): qty 1

## 2020-12-30 MED ORDER — LIDOCAINE 2% (20 MG/ML) 5 ML SYRINGE
INTRAMUSCULAR | Status: DC | PRN
  Administered 2020-12-30: 60 mg via INTRAVENOUS

## 2020-12-30 MED ORDER — ONDANSETRON HCL 4 MG PO TABS
8.0000 mg | ORAL_TABLET | Freq: Three times a day (TID) | ORAL | Status: DC | PRN
Start: 2020-12-30 — End: 2021-01-10

## 2020-12-30 MED ORDER — HYDROCODONE-ACETAMINOPHEN 5-325 MG PO TABS
1.0000 | ORAL_TABLET | Freq: Four times a day (QID) | ORAL | Status: DC | PRN
  Administered 2020-12-31 – 2021-01-06 (×19): 1 via ORAL
  Filled 2020-12-30 (×20): qty 1

## 2020-12-30 MED ORDER — THROMBIN 5000 UNITS EX SOLR
CUTANEOUS | Status: AC
  Filled 2020-12-30: qty 5000

## 2020-12-30 MED ORDER — SUGAMMADEX SODIUM 200 MG/2ML IV SOLN
INTRAVENOUS | Status: DC | PRN
  Administered 2020-12-30: 200 mg via INTRAVENOUS

## 2020-12-30 MED ORDER — POTASSIUM CHLORIDE IN NACL 20-0.9 MEQ/L-% IV SOLN
INTRAVENOUS | Status: DC
  Filled 2020-12-30 (×17): qty 1000

## 2020-12-30 MED ORDER — HYDROMORPHONE HCL 1 MG/ML IJ SOLN: 0.2500 mg | INTRAMUSCULAR | Status: DC | PRN

## 2020-12-30 MED ORDER — CHLORHEXIDINE GLUCONATE 0.12 % MT SOLN
15.0000 mL | Freq: Once | OROMUCOSAL | Status: AC
  Administered 2020-12-30: 15 mL via OROMUCOSAL
  Filled 2020-12-30: qty 15

## 2020-12-30 MED ORDER — ACETAMINOPHEN 325 MG PO TABS
650.0000 mg | ORAL_TABLET | ORAL | Status: DC | PRN
  Administered 2021-01-01 – 2021-01-02 (×2): 650 mg via ORAL
  Filled 2020-12-30 (×3): qty 2

## 2020-12-30 MED ORDER — THROMBIN 20000 UNITS EX SOLR
CUTANEOUS | Status: DC | PRN
  Administered 2020-12-30: 20 mL via TOPICAL

## 2020-12-30 MED ORDER — CHLORHEXIDINE GLUCONATE CLOTH 2 % EX PADS: 6.0000 | MEDICATED_PAD | Freq: Once | CUTANEOUS | Status: DC

## 2020-12-30 MED ORDER — POLYETHYLENE GLYCOL 3350 17 G PO PACK: 17.0000 g | PACK | Freq: Every day | ORAL | Status: DC | PRN

## 2020-12-30 MED ORDER — OXYCODONE HCL 5 MG PO TABS: 5.0000 mg | ORAL_TABLET | Freq: Once | ORAL | Status: DC | PRN

## 2020-12-30 MED ORDER — PHENYLEPHRINE 40 MCG/ML (10ML) SYRINGE FOR IV PUSH (FOR BLOOD PRESSURE SUPPORT)
PREFILLED_SYRINGE | INTRAVENOUS | Status: DC | PRN
  Administered 2020-12-30: 40 ug via INTRAVENOUS

## 2020-12-30 MED ORDER — AMISULPRIDE (ANTIEMETIC) 5 MG/2ML IV SOLN: 10.0000 mg | Freq: Once | INTRAVENOUS | Status: DC | PRN

## 2020-12-30 MED ORDER — DEXAMETHASONE SODIUM PHOSPHATE 4 MG/ML IJ SOLN
4.0000 mg | Freq: Three times a day (TID) | INTRAMUSCULAR | Status: DC
  Administered 2020-12-30 – 2020-12-31 (×3): 4 mg via INTRAVENOUS
  Filled 2020-12-30 (×3): qty 1

## 2020-12-30 MED ORDER — SODIUM CHLORIDE 0.9 % IV SOLN
INTRAVENOUS | Status: DC | PRN
  Administered 2020-12-30: 250 mL via INTRAVENOUS

## 2020-12-30 MED ORDER — THROMBIN 5000 UNITS EX SOLR
OROMUCOSAL | Status: DC | PRN
  Administered 2020-12-30: 5 mL via TOPICAL

## 2020-12-30 MED ORDER — AMLODIPINE BESYLATE 10 MG PO TABS
10.0000 mg | ORAL_TABLET | Freq: Every day | ORAL | Status: DC
  Administered 2020-12-31 – 2021-01-07 (×8): 10 mg via ORAL
  Filled 2020-12-30 (×8): qty 1

## 2020-12-30 MED ORDER — 0.9 % SODIUM CHLORIDE (POUR BTL) OPTIME
TOPICAL | Status: DC | PRN
  Administered 2020-12-30 (×2): 1000 mL

## 2020-12-30 MED ORDER — FAMOTIDINE 20 MG PO TABS
20.0000 mg | ORAL_TABLET | Freq: Two times a day (BID) | ORAL | Status: DC
  Administered 2020-12-31 – 2021-01-08 (×17): 20 mg via ORAL
  Filled 2020-12-30 (×17): qty 1

## 2020-12-30 MED ORDER — LEVETIRACETAM 500 MG PO TABS: 500.0000 mg | ORAL_TABLET | Freq: Two times a day (BID) | ORAL | Status: DC

## 2020-12-30 MED ORDER — ONDANSETRON HCL 4 MG/2ML IJ SOLN
INTRAMUSCULAR | Status: DC | PRN
  Administered 2020-12-30: 4 mg via INTRAVENOUS

## 2020-12-30 MED ORDER — DEXAMETHASONE SODIUM PHOSPHATE 10 MG/ML IJ SOLN
INTRAMUSCULAR | Status: AC
  Filled 2020-12-30: qty 1

## 2020-12-30 MED ORDER — SODIUM CHLORIDE 0.9 % IV SOLN
0.0500 ug/kg/min | INTRAVENOUS | Status: DC
  Filled 2020-12-30: qty 5000

## 2020-12-30 MED ORDER — THROMBIN 20000 UNITS EX SOLR
CUTANEOUS | Status: AC
  Filled 2020-12-30: qty 20000

## 2020-12-30 MED ORDER — LOSARTAN POTASSIUM 50 MG PO TABS
100.0000 mg | ORAL_TABLET | Freq: Every day | ORAL | Status: DC
  Administered 2020-12-31 – 2021-01-07 (×8): 100 mg via ORAL
  Filled 2020-12-30 (×8): qty 2

## 2020-12-30 MED ORDER — CEFAZOLIN SODIUM-DEXTROSE 1-4 GM/50ML-% IV SOLN
1.0000 g | Freq: Three times a day (TID) | INTRAVENOUS | Status: AC
  Administered 2020-12-30 – 2020-12-31 (×3): 1 g via INTRAVENOUS
  Filled 2020-12-30 (×3): qty 50

## 2020-12-30 MED ORDER — LIDOCAINE-EPINEPHRINE 1 %-1:100000 IJ SOLN
INTRAMUSCULAR | Status: DC | PRN
  Administered 2020-12-30: 10 mL

## 2020-12-30 MED ORDER — CLONAZEPAM 0.5 MG PO TABS
0.5000 mg | ORAL_TABLET | Freq: Every evening | ORAL | Status: DC | PRN
  Administered 2021-01-02 – 2021-01-06 (×2): 0.5 mg via ORAL
  Filled 2020-12-30 (×2): qty 1

## 2020-12-30 MED ORDER — ONDANSETRON HCL 4 MG/2ML IJ SOLN
INTRAMUSCULAR | Status: AC
  Filled 2020-12-30: qty 2

## 2020-12-30 MED ORDER — FLEET ENEMA 7-19 GM/118ML RE ENEM: 1.0000 | ENEMA | Freq: Once | RECTAL | Status: DC | PRN

## 2020-12-30 MED ORDER — ONDANSETRON HCL 4 MG/2ML IJ SOLN
4.0000 mg | INTRAMUSCULAR | Status: DC | PRN
  Filled 2020-12-30: qty 2

## 2020-12-30 MED ORDER — PANTOPRAZOLE SODIUM 20 MG PO TBEC
20.0000 mg | DELAYED_RELEASE_TABLET | Freq: Every day | ORAL | Status: DC
  Administered 2020-12-31 – 2021-01-06 (×7): 20 mg via ORAL
  Filled 2020-12-30 (×9): qty 1

## 2020-12-30 MED ORDER — ORAL CARE MOUTH RINSE: 15.0000 mL | Freq: Once | OROMUCOSAL | Status: AC

## 2020-12-30 MED ORDER — METOPROLOL SUCCINATE ER 100 MG PO TB24
100.0000 mg | ORAL_TABLET | Freq: Every day | ORAL | Status: DC
  Administered 2020-12-31 – 2021-01-08 (×8): 100 mg via ORAL
  Filled 2020-12-30 (×9): qty 1

## 2020-12-30 MED ORDER — PHENYLEPHRINE HCL (PRESSORS) 10 MG/ML IV SOLN
INTRAVENOUS | Status: AC
  Filled 2020-12-30: qty 1

## 2020-12-30 MED ORDER — PROPOFOL 10 MG/ML IV BOLUS
INTRAVENOUS | Status: AC
  Filled 2020-12-30: qty 20

## 2020-12-30 MED ORDER — ROCURONIUM BROMIDE 10 MG/ML (PF) SYRINGE
PREFILLED_SYRINGE | INTRAVENOUS | Status: DC | PRN
  Administered 2020-12-30: 20 mg via INTRAVENOUS
  Administered 2020-12-30: 60 mg via INTRAVENOUS

## 2020-12-30 MED ORDER — MORPHINE SULFATE (PF) 2 MG/ML IV SOLN
1.0000 mg | INTRAVENOUS | Status: DC | PRN
  Administered 2020-12-30: 2 mg via INTRAVENOUS
  Administered 2020-12-30 – 2020-12-31 (×4): 1 mg via INTRAVENOUS
  Administered 2021-01-03 – 2021-01-07 (×22): 2 mg via INTRAVENOUS
  Filled 2020-12-30 (×29): qty 1

## 2020-12-30 MED ORDER — INSULIN ASPART 100 UNIT/ML ~~LOC~~ SOLN
0.0000 [IU] | Freq: Three times a day (TID) | SUBCUTANEOUS | Status: DC
  Administered 2020-12-30 (×2): 2 [IU] via SUBCUTANEOUS
  Administered 2020-12-31 (×2): 3 [IU] via SUBCUTANEOUS
  Administered 2020-12-31: 2 [IU] via SUBCUTANEOUS

## 2020-12-30 MED ORDER — PROMETHAZINE HCL 12.5 MG PO TABS
12.5000 mg | ORAL_TABLET | ORAL | Status: DC | PRN
  Filled 2020-12-30: qty 2

## 2020-12-30 MED ORDER — LABETALOL HCL 5 MG/ML IV SOLN: 10.0000 mg | INTRAVENOUS | Status: DC | PRN

## 2020-12-30 MED ORDER — FENTANYL CITRATE (PF) 250 MCG/5ML IJ SOLN
INTRAMUSCULAR | Status: AC
  Filled 2020-12-30: qty 5

## 2020-12-30 MED ORDER — SODIUM CHLORIDE 0.9 % IV SOLN: INTRAVENOUS | Status: DC | PRN

## 2020-12-30 MED ORDER — LIDOCAINE 2% (20 MG/ML) 5 ML SYRINGE
INTRAMUSCULAR | Status: AC
  Filled 2020-12-30: qty 5

## 2020-12-30 MED ORDER — MICROFIBRILLAR COLL HEMOSTAT EX PADS
MEDICATED_PAD | CUTANEOUS | Status: DC | PRN
  Administered 2020-12-30: 1 via TOPICAL

## 2020-12-30 MED ORDER — DOCUSATE SODIUM 100 MG PO CAPS
100.0000 mg | ORAL_CAPSULE | Freq: Two times a day (BID) | ORAL | Status: DC
  Administered 2020-12-31 – 2021-01-06 (×13): 100 mg via ORAL
  Filled 2020-12-30 (×15): qty 1

## 2020-12-30 MED ORDER — NICARDIPINE HCL IN NACL 20-0.86 MG/200ML-% IV SOLN: 3.0000 mg/h | INTRAVENOUS | Status: DC

## 2020-12-30 MED ORDER — LIDOCAINE-EPINEPHRINE 1 %-1:100000 IJ SOLN
INTRAMUSCULAR | Status: AC
  Filled 2020-12-30: qty 1

## 2020-12-30 MED ORDER — GLYCOPYRROLATE PF 0.2 MG/ML IJ SOSY
PREFILLED_SYRINGE | INTRAMUSCULAR | Status: DC | PRN
  Administered 2020-12-30 (×2): .1 mg via INTRAVENOUS

## 2020-12-30 MED ORDER — BACITRACIN ZINC 500 UNIT/GM EX OINT
TOPICAL_OINTMENT | CUTANEOUS | Status: AC
  Filled 2020-12-30: qty 28.35

## 2020-12-30 MED ORDER — LEVOTHYROXINE SODIUM 25 MCG PO TABS
25.0000 ug | ORAL_TABLET | Freq: Every day | ORAL | Status: DC
  Administered 2021-01-01 – 2021-01-08 (×8): 25 ug via ORAL
  Filled 2020-12-30 (×8): qty 1

## 2020-12-30 MED ORDER — ROSUVASTATIN CALCIUM 5 MG PO TABS
10.0000 mg | ORAL_TABLET | Freq: Every day | ORAL | Status: DC
  Administered 2020-12-31 – 2021-01-06 (×7): 10 mg via ORAL
  Filled 2020-12-30 (×8): qty 2

## 2020-12-30 MED ORDER — SODIUM CHLORIDE 0.9 % IV SOLN
INTRAVENOUS | Status: DC | PRN
  Administered 2020-12-30: .05 ug/kg/min via INTRAVENOUS

## 2020-12-30 MED ORDER — CEFAZOLIN SODIUM-DEXTROSE 2-4 GM/100ML-% IV SOLN
2.0000 g | INTRAVENOUS | Status: AC
  Administered 2020-12-30: 2 g via INTRAVENOUS
  Filled 2020-12-30: qty 100

## 2020-12-30 MED ORDER — PROPOFOL 500 MG/50ML IV EMUL
INTRAVENOUS | Status: DC | PRN
  Administered 2020-12-30: 100 ug/kg/min via INTRAVENOUS

## 2020-12-30 MED ORDER — ACETAMINOPHEN 650 MG RE SUPP
650.0000 mg | RECTAL | Status: DC | PRN
  Filled 2020-12-30: qty 1

## 2020-12-30 SURGICAL SUPPLY — 95 items
BAND RUBBER #18 3X1/16 STRL (MISCELLANEOUS) ×6 IMPLANT
BENZOIN TINCTURE PRP APPL 2/3 (GAUZE/BANDAGES/DRESSINGS) IMPLANT
BLADE CLIPPER SURG (BLADE) ×3 IMPLANT
BLADE SAW GIGLI 16 STRL (MISCELLANEOUS) IMPLANT
BLADE SURG 11 STRL SS (BLADE) ×3 IMPLANT
BLADE SURG 15 STRL LF DISP TIS (BLADE) IMPLANT
BLADE SURG 15 STRL SS (BLADE)
BNDG GAUZE ELAST 4 BULKY (GAUZE/BANDAGES/DRESSINGS) IMPLANT
BNDG STRETCH 4X75 STRL LF (GAUZE/BANDAGES/DRESSINGS) IMPLANT
BUR ACORN 9.0 PRECISION (BURR) ×3 IMPLANT
BUR ROUND FLUTED 4 SOFT TCH (BURR) ×3 IMPLANT
BUR SPIRAL ROUTER 2.3 (BUR) ×3 IMPLANT
CANISTER SUCT 3000ML PPV (MISCELLANEOUS) ×6 IMPLANT
CATH VENTRIC 35X38 W/TROCAR LG (CATHETERS) IMPLANT
CLIP VESOCCLUDE MED 6/CT (CLIP) IMPLANT
CNTNR URN SCR LID CUP LEK RST (MISCELLANEOUS) ×4 IMPLANT
CONT SPEC 4OZ STRL OR WHT (MISCELLANEOUS) ×2
COVER BURR HOLE UNIV 10 (Orthopedic Implant) ×6 IMPLANT
COVER MAYO STAND STRL (DRAPES) IMPLANT
COVER WAND RF STERILE (DRAPES) IMPLANT
DECANTER SPIKE VIAL GLASS SM (MISCELLANEOUS) ×3 IMPLANT
DRAIN SUBARACHNOID (WOUND CARE) IMPLANT
DRAPE HALF SHEET 40X57 (DRAPES) ×3 IMPLANT
DRAPE MICROSCOPE LEICA (MISCELLANEOUS) ×3 IMPLANT
DRAPE NEUROLOGICAL W/INCISE (DRAPES) ×3 IMPLANT
DRAPE STERI IOBAN 125X83 (DRAPES) IMPLANT
DRAPE SURG 17X23 STRL (DRAPES) IMPLANT
DRAPE WARM FLUID 44X44 (DRAPES) ×3 IMPLANT
DRSG ADAPTIC 3X8 NADH LF (GAUZE/BANDAGES/DRESSINGS) IMPLANT
DRSG AQUACEL AG ADV 3.5X10 (GAUZE/BANDAGES/DRESSINGS) ×3 IMPLANT
DRSG TELFA 3X8 NADH (GAUZE/BANDAGES/DRESSINGS) IMPLANT
DRSG XEROFORM 1X8 (GAUZE/BANDAGES/DRESSINGS) ×3 IMPLANT
DURAPREP 6ML APPLICATOR 50/CS (WOUND CARE) ×3 IMPLANT
ELECT COATED BLADE 2.86 ST (ELECTRODE) IMPLANT
ELECT REM PT RETURN 9FT ADLT (ELECTROSURGICAL) ×3
ELECTRODE REM PT RTRN 9FT ADLT (ELECTROSURGICAL) ×2 IMPLANT
EVACUATOR 1/8 PVC DRAIN (DRAIN) IMPLANT
EVACUATOR SILICONE 100CC (DRAIN) IMPLANT
FORCEPS BIPO MALIS IRRIG 9X1.5 (NEUROSURGERY SUPPLIES) ×3 IMPLANT
GAUZE 4X4 16PLY RFD (DISPOSABLE) IMPLANT
GAUZE SPONGE 4X4 12PLY STRL (GAUZE/BANDAGES/DRESSINGS) IMPLANT
GLOVE BIO SURGEON STRL SZ7.5 (GLOVE) IMPLANT
GLOVE BIOGEL PI IND STRL 7.5 (GLOVE) ×2 IMPLANT
GLOVE BIOGEL PI INDICATOR 7.5 (GLOVE) ×1
GLOVE ECLIPSE 7.5 STRL STRAW (GLOVE) ×6 IMPLANT
GLOVE EXAM NITRILE LRG STRL (GLOVE) IMPLANT
GLOVE EXAM NITRILE XL STR (GLOVE) IMPLANT
GLOVE SURG ENC MOIS LTX SZ6.5 (GLOVE) ×3 IMPLANT
GLOVE SURG SS PI 7.5 STRL IVOR (GLOVE) ×6 IMPLANT
GLOVE SURG UNDER POLY LF SZ6.5 (GLOVE) ×6 IMPLANT
GLOVE SURG UNDER POLY LF SZ7 (GLOVE) ×6 IMPLANT
GOWN STRL REUS W/ TWL LRG LVL3 (GOWN DISPOSABLE) ×4 IMPLANT
GOWN STRL REUS W/ TWL XL LVL3 (GOWN DISPOSABLE) ×6 IMPLANT
GOWN STRL REUS W/TWL 2XL LVL3 (GOWN DISPOSABLE) IMPLANT
GOWN STRL REUS W/TWL LRG LVL3 (GOWN DISPOSABLE) ×2
GOWN STRL REUS W/TWL XL LVL3 (GOWN DISPOSABLE) ×3
GRAFT DURAGEN MATRIX 3WX3L (Graft) ×1 IMPLANT
GRAFT DURAGEN MATRIX 3X3 SNGL (Graft) ×2 IMPLANT
HEMOSTAT POWDER KIT SURGIFOAM (HEMOSTASIS) ×3 IMPLANT
HEMOSTAT SURGICEL 2X14 (HEMOSTASIS) ×3 IMPLANT
HOOK DURA 1/2IN (MISCELLANEOUS) IMPLANT
IV NS 1000ML (IV SOLUTION) ×1
IV NS 1000ML BAXH (IV SOLUTION) ×2 IMPLANT
KIT BASIN OR (CUSTOM PROCEDURE TRAY) ×3 IMPLANT
KIT DRAIN CSF ACCUDRAIN (MISCELLANEOUS) IMPLANT
KIT TURNOVER KIT B (KITS) ×3 IMPLANT
MARKER SPHERE PSV REFLC 13MM (MARKER) ×18 IMPLANT
NEEDLE HYPO 22GX1.5 SAFETY (NEEDLE) ×3 IMPLANT
NEEDLE SPNL 18GX3.5 QUINCKE PK (NEEDLE) IMPLANT
NS IRRIG 1000ML POUR BTL (IV SOLUTION) ×9 IMPLANT
PACK CRANIOTOMY CUSTOM (CUSTOM PROCEDURE TRAY) ×3 IMPLANT
PATTIES SURGICAL .25X.25 (GAUZE/BANDAGES/DRESSINGS) IMPLANT
PATTIES SURGICAL .5 X.5 (GAUZE/BANDAGES/DRESSINGS) IMPLANT
PATTIES SURGICAL .5 X3 (DISPOSABLE) IMPLANT
PATTIES SURGICAL 1/4 X 3 (GAUZE/BANDAGES/DRESSINGS) IMPLANT
PATTIES SURGICAL 1X1 (DISPOSABLE) IMPLANT
PIN MAYFIELD SKULL DISP (PIN) ×3 IMPLANT
PLATE BONE 12 2H TARGET XL (Plate) ×6 IMPLANT
SCREW UNIII AXS SD 1.5X4 (Screw) ×36 IMPLANT
SET TUBING IRRIGATION DISP (TUBING) ×3 IMPLANT
SPONGE NEURO XRAY DETECT 1X3 (DISPOSABLE) IMPLANT
SPONGE SURGIFOAM ABS GEL 100 (HEMOSTASIS) ×3 IMPLANT
STAPLER VISISTAT 35W (STAPLE) ×3 IMPLANT
SUT ETHILON 3 0 FSL (SUTURE) IMPLANT
SUT ETHILON 3 0 PS 1 (SUTURE) IMPLANT
SUT MNCRL AB 3-0 PS2 18 (SUTURE) IMPLANT
SUT NURALON 4 0 TR CR/8 (SUTURE) ×6 IMPLANT
SUT SILK 0 TIES 10X30 (SUTURE) IMPLANT
SUT VIC AB 2-0 CP2 18 (SUTURE) ×3 IMPLANT
TOWEL GREEN STERILE (TOWEL DISPOSABLE) ×3 IMPLANT
TOWEL GREEN STERILE FF (TOWEL DISPOSABLE) ×3 IMPLANT
TRAY FOLEY MTR SLVR 16FR STAT (SET/KITS/TRAYS/PACK) ×3 IMPLANT
TUBE CONNECTING 12X1/4 (SUCTIONS) ×3 IMPLANT
UNDERPAD 30X36 HEAVY ABSORB (UNDERPADS AND DIAPERS) IMPLANT
WATER STERILE IRR 1000ML POUR (IV SOLUTION) ×3 IMPLANT

## 2020-12-30 NOTE — Anesthesia Procedure Notes (Signed)
Procedure Name: Intubation Date/Time: 01/16/2021 8:56 AM Performed by: Bea Graff, RN Pre-anesthesia Checklist: Patient identified, Emergency Drugs available, Suction available and Patient being monitored Patient Re-evaluated:Patient Re-evaluated prior to induction Oxygen Delivery Method: Circle system utilized Preoxygenation: Pre-oxygenation with 100% oxygen Induction Type: IV induction Ventilation: Mask ventilation without difficulty Laryngoscope Size: Mac and 3 Grade View: Grade I Tube type: Oral Tube size: 7.0 mm Number of attempts: 1 Airway Equipment and Method: Stylet Placement Confirmation: ETT inserted through vocal cords under direct vision,  positive ETCO2 and breath sounds checked- equal and bilateral Tube secured with: Tape Dental Injury: Teeth and Oropharynx as per pre-operative assessment

## 2020-12-30 NOTE — Anesthesia Postprocedure Evaluation (Signed)
Anesthesia Post Note  Patient: [place]  Procedure(s) Performed: LEFT CRANIOTOMY FOR RESECTON OF TUMOR WITH BRAIN LAB (Left Head) APPLICATION OF CRANIAL NAVIGATION (N/A )     Patient location during evaluation: PACU Anesthesia Type: General Level of consciousness: awake Pain management: pain level controlled Vital Signs Assessment: post-procedure vital signs reviewed and stable Respiratory status: spontaneous breathing and respiratory function stable Cardiovascular status: stable Postop Assessment: no apparent nausea or vomiting Anesthetic complications: no   No complications documented.  Last Vitals:  Vitals:   01/13/2021 1245 01/19/2021 1300  BP: 129/69 121/65  Pulse: (!) 59 (!) 59  Resp: 19 16  Temp: 36.7 C   SpO2: 98% 96%    Last Pain:  Vitals:   12/29/2020 1245  TempSrc: Oral  PainSc: 0-No pain                 Merlinda Frederick

## 2020-12-30 NOTE — H&P (Signed)
CC: right sided weakness  HPI:     Patient is a 70 y.o. female with uterine cancer developed progressive right sided weakness and speech difficulties.  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  After interdisciplinary discussion, preop SRS followed by surgery was recommended.  She was placed on steroids and yesterday underwent SRS.  She reports worsening of her right isded weakness over the past several days.    Patient Active Problem List   Diagnosis Date Noted  . Brain metastasis ([place]) 12/23/2020  . Metastasis to brain ([place]) 12/22/2020  . Acquired hypothyroidism 12/11/2020  . Chronic diarrhea 12/01/2020  . Skin rash 12/01/2020  . 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 Type 2 diabetes mellitus with hyperglycemia, without long-term current use of insulin ([place]) 10/02/2020  . Hyperparathyroidism, unspecified ([place])   . Port-A-Cath in place 01/13/2020  . Pulmonary infiltrates 12/19/2019  . Kidney lesion, native, left 12/19/2019  . Goals of care, counseling/discussion 12/12/2019  . Uterine cancer ([place]) 12/11/2019  . 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 Other fatigue 12/11/2019  . Pancytopenia, acquired ([place]) 12/11/2019  . Family history of cancer 12/11/2019  . Diabetes mellitus without complication, without long-term current use of insulin ([place]) 12/11/2019  . Essential hypertension 12/11/2019  . Peripheral neuropathy due to chemotherapy ([place]) 12/11/2019   Past Medical History:  Diagnosis Date  . Cancer ([place]) 2019  . Diabetes mellitus without complication ([place])   . Family history of adverse reaction to anesthesia   . Hyperparathyroidism, unspecified ([place])   . Hypertension   . PONV (postoperative nausea and vomiting)     Past Surgical History:  Procedure Laterality Date  . ABDOMINAL HYSTERECTOMY    . CESAREAN SECTION    . CHOLECYSTECTOMY    . PARATHYROIDECTOMY    . TONSILLECTOMY      Medications Prior to  Admission  Medication Sig Dispense Refill Last Dose  . amLODipine (NORVASC) 10 MG tablet Take 1 tablet (10 mg total) by mouth daily. 90 tablet 3 01/08/2021 at 0545  . clonazePAM (KLONOPIN) 0.5 MG tablet Take 1 tablet (0.5 mg total) by mouth at bedtime as needed (sleep). 30 tablet 3 12/29/2020 at Unknown time  . dexamethasone (DECADRON) 4 MG tablet Take 1 tablet (4 mg total) by mouth 3 (three) times daily. 90 tablet 1 01/22/2021 at 0545  . famotidine (PEPCID) 20 MG tablet Take 1 tablet (20 mg total) by mouth 2 (two) times daily. 60 tablet 0 01/07/2021 at 0545  . hydrALAZINE (APRESOLINE) 25 MG tablet Take 1 tablet (25 mg total) by mouth 3 (three) times daily. 270 tablet 3 Past Week at Unknown time  . HYDROcodone-acetaminophen (NORCO/VICODIN) 5-325 MG tablet Take 1 tablet by mouth every 6 (six) hours as needed. (Patient taking differently: Take 1 tablet by mouth every 6 (six) hours as needed for moderate pain.) 60 tablet 0 12/29/2020 at Unknown time  . insulin aspart (NOVOLOG) 100 UNIT/ML FlexPen CBG 70 - 120: 0 units  CBG 121 - 150: 2 units  CBG 151 - 200: 3 units  CBG 201 - 250: 5 units  CBG 251 - 300: 8 units  CBG 301 - 350: 11 units  CBG 351 - 400: 15 units 15 mL 0 12/29/2020 at Unknown time  . levETIRAcetam (KEPPRA) 500 MG tablet Take 1 tablet (500 mg total) by mouth 2 (two) times daily. 60 tablet 0 01/24/2021 at 0545  . levothyroxine (SYNTHROID) 25 MCG tablet  Take 1 tablet (25 mcg total) by mouth daily before breakfast. 30 tablet 11 01/13/2021 at 0545  . lidocaine-prilocaine (EMLA) cream Apply to affected area once 30 g 3 Past Month at Unknown time  . losartan (COZAAR) 100 MG tablet Take 100 mg by mouth daily.   12/29/2020 at Unknown time  . metoprolol succinate (TOPROL-XL) 100 MG 24 hr tablet Take 1 tablet (100 mg total) by mouth daily. Take with or immediately following a meal. 30 tablet 1 01/23/2021 at 0545  . pantoprazole (PROTONIX) 20 MG tablet Take 1 tablet (20 mg total) by mouth daily. 30 tablet 0 01/02/2021  at 0545  . rosuvastatin (CRESTOR) 10 MG tablet Take 1 tablet (10 mg total) by mouth daily. 90 tablet 2 01/21/2021 at 0545  . terazosin (HYTRIN) 2 MG capsule Take 1 capsule (2 mg total) by mouth at bedtime. 30 capsule 1 12/29/2020 at Unknown time  . blood glucose meter kit and supplies Dispense based on patient and insurance preference. Use up to four times daily as directed. (FOR ICD-10 E10.9, E11.9). 1 each 0   . Insulin Pen Needle (PEN NEEDLES 3/16") 31G X 5 MM MISC Use as directed with insulin pen 100 each 0   . ondansetron (ZOFRAN) 8 MG tablet Take 1 tablet (8 mg total) by mouth every 8 (eight) hours as needed (Nausea or vomiting). 30 tablet 1 Unknown at Unknown time  . prochlorperazine (COMPAZINE) 10 MG tablet Take 1 tablet (10 mg total) by mouth every 6 (six) hours as needed (Nausea or vomiting). 30 tablet 1 Unknown at Unknown time   Allergies  Allergen Reactions  . Lisinopril Swelling    Face swelling Face swelling   . Verapamil Rash  . Other Rash    ADHESIVES ADHESIVES ADHESIVES ADHESIVES     Social History   Tobacco Use  . Smoking status: Never Smoker  . Smokeless tobacco: Never Used  Substance Use Topics  . Alcohol use: Never    Family History  Problem Relation Age of Onset  . Cancer Mother 30       breast ca  . Cancer Father        testicular, kidney cancer, liver  . Stroke Father   . Diabetes Father   . Cancer Brother        testicular cancer     Review of Systems Pertinent items noted in HPI and remainder of comprehensive ROS otherwise negative.  Objective:   Patient Vitals for the past 8 hrs:  BP Temp Temp src Pulse Resp SpO2 Height Weight  01/11/2021 0655 123/67 -- -- -- -- -- -- --  01/05/2021 0654 -- 98.2 F (36.8 C) Oral (!) 50 18 95 % $Re'5\' 2"'dMm$  (1.575 m) 68 kg   No intake/output data recorded. No intake/output data recorded.      General : Alert, cooperative, no distress, appears stated age.  Slightly drowsy   Head:  Normocephalic/atraumatic     Eyes: PERRL, conjunctiva/corneas clear, EOM's intact. Fundi could not be visualized Neck: Supple Chest:  Respirations unlabored Chest wall: no tenderness or deformity Heart: Regular rate and rhythm Abdomen: Soft, nontender and nondistended Extremities: warm and well-perfused Skin: normal turgor, color and texture Neurologic:  Alert, oriented x 3.  Eyes open spontaneously. PERRL, EOMI, right VFC, right facial droop, right arm 2/5, right leg 3/5. V1-3 intact. Speech slow with some dysarthria.        Data Review CBC:  Lab Results  Component Value Date   WBC 8.5 01/03/2021  RBC 4.56 01/22/2021   BMP:  Lab Results  Component Value Date   GLUCOSE 175 (H) 01/25/2021   CO2 24 01/25/2021   BUN 27 (H) 01/05/2021   CREATININE 0.92 01/04/2021   CREATININE 1.00 12/11/2020   CALCIUM 9.9 01/13/2021    Assessment:   Metastatic brain disease   Plan:   - plan for palliative resection today

## 2020-12-30 NOTE — Progress Notes (Signed)
Paged NP [place] of neurosurgery. Patient currently NPO. Oral Keppra 500 mg due at 2200. Verbal order to change keppra order to IV route. Will continue to monitor.

## 2020-12-30 NOTE — Progress Notes (Addendum)
  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  Intent: Palliative  Radiation Treatment Dates: 12/29/2020 through 12/29/2020 SRS Treatment: Site Technique Total Dose (Gy) Dose per Fx (Gy) Completed Fx Beam Energies  Brain: Brain_PTV1 Lt Parietal 22mm, Pre-op - 14Gy  IMRT 20/20 20 1/1 6XFFF  Brain:  Brain_PTV 2 Lt Frontal 19mm. 20 Gy   IMRT 20/20 20 1/1 6XFFF  Brain: Brain_PTV 3 Rt Frontal 57mm, 20 Gy   IMRT 20/20 20 1/1 6XFFF   Narrative: The patient tolerated radiation therapy relatively well. She will proceed with surgery tomorrow.  Plan: The patient will receive a one month call from the radiation department. She will also follow up with Dr. [name] in the postoperative setting. He will coordinate steroid taper instructions. She will see Dr. Mickeal Skinner as well for long term surveillance and her case will be reviewed in multidisciplinary brain oncology conference. I'll notify our navigator as well who will coordinate her first post treatment MRI of the brain in about 3 months. ________________________________________________    Carola Rhine, PAC

## 2020-12-30 NOTE — Transfer of Care (Signed)
Immediate Anesthesia Transfer of Care Note  Patient: Sandra Arroyo  Procedure(s) Performed: LEFT CRANIOTOMY FOR RESECTON OF TUMOR WITH BRAIN LAB (Left Head) APPLICATION OF CRANIAL NAVIGATION (N/A )  Patient Location: PACU  Anesthesia Type:General  Level of Consciousness: awake, alert , oriented and patient cooperative  Airway & Oxygen Therapy: Patient Spontanous Breathing and Patient connected to face mask oxygen  Post-op Assessment: Report given to RN and Post -op Vital signs reviewed and stable  Post vital signs: Reviewed and stable  Last Vitals:  Vitals Value Taken Time  BP    Temp    Pulse    Resp 19 January 09, 2021 1128  SpO2    Vitals shown include unvalidated device data.  Last Pain:  Vitals:   Jan 09, 2021 0725  TempSrc:   PainSc: 0-No pain         Complications: No complications documented.

## 2020-12-30 NOTE — Anesthesia Procedure Notes (Signed)
Arterial Line Insertion Start/End2022-02-26 8:10 AM, 01-23-21 8:15 AM Performed by: Merlinda Frederick, MD  Patient location: Pre-op. Preanesthetic checklist: patient identified, IV checked, site marked, risks and benefits discussed, surgical consent, monitors and equipment checked, pre-op evaluation, timeout performed and anesthesia consent Lidocaine 1% used for infiltration Right, radial was placed Catheter size: 20 G Hand hygiene performed  and maximum sterile barriers used  Allen's test indicative of satisfactory collateral circulation Attempts: 1 Procedure performed without using ultrasound guided technique. Following insertion, dressing applied and Biopatch. Post procedure assessment: normal  Patient tolerated the procedure well with no immediate complications.

## 2020-12-30 NOTE — Op Note (Signed)
Procedure(s): LEFT CRANIOTOMY FOR RESECTON OF TUMOR WITH BRAIN LAB APPLICATION OF CRANIAL NAVIGATION Procedure Note  Rickia Freeburg female 70 y.o. 06-Jan-2021  Procedure(s) and Anesthesia Type:    * LEFT CRANIOTOMY FOR RESECTON OF TUMOR WITH BRAIN LAB - General    * APPLICATION OF CRANIAL NAVIGATION - General  Surgeon(s) and Role:    [name], Dorcas Carrow, MD - Primary   Indications: This is a 70 year old woman with uterine cancer who developed progressive right-sided weakness and speech difficulties.  She was found to have metastatic CNS disease including 1 small left frontal and right frontal metastasis and a large left parietal metastasis measuring 4.6 cm with significant vasogenic edema associated with it.  Her symptoms improved slightly with steroids.  After interdisciplinary discussion, recommendation was given for preoperative stereotactic radiosurgery to the 3 lesions followed by resection of the large symptomatic lesion.  Risks, benefits, alternatives, expected convalescence were discussed with the patient.  Risks discussed included, but were not limited to, bleeding, pain, infection, seizure, scar, stroke, neurologic deficit, coma, and death.  We specifically address goals of care is as well given her significant functional deficits.  The patient and family wish to continue with aggressive therapies and informed consent was obtained.   She underwent preoperative SRS yesterday.     Surgeon: Vallarie Mare   Assistants: none  Anesthesia: General endotracheal anesthesia   Procedure Detail  LEFT CRANIOTOMY FOR RESECTON OF TUMOR WITH BRAIN LAB, APPLICATION OF CRANIAL NAVIGATION  Patient was brought to the operating room.  After appropriate lines and monitors placed, general anesthesia was induced and patient was intubated by the anesthesia service.  Patient was positioned supine with head turned to the right with all pressure points padded and eyes protected.  Head was placed in  [place] head holder and affixed the bed.  Preoperative thin cut MRI was reconstructed with a 3D image.  This allowed for surface registration using the BrainLab system.  A curvilinear incision was planned over the left parietal area and the scalp was clipped, preprepped with alcohol and prepped and draped in sterile fashion.  1% lidocaine with epinephrine was injected in the skin.  A timeout was performed.  Preoperative antibiotics and dexamethasone were given.  Incision was made with a 10 blade and monopolar electrocautery was used to incise the galea and the periosteum.  Subtenon retractors used.  High-speed drill was used to perform 2 bur holes which were used to dissect the dura from the inner table skull.  The craniotome was used to perform a craniotomy spanning the lesion.  The dura was opened sharply.  The tumor was identified coming to the peel surface in the somatosensory area.  A vein in the central sulcus was identified.  Corticotomy was made just posterior to this vein and spanning posteriorly to the superficial margin of the tumor.  The tumor was then dissected from the underlying white matter.  There was cystic component of the tumor and to help with mobilization of the tumor, this cyst was decompressed with care to avoid tumor spillage in the subarachnoid space.  Circumferential white matter dissection continued.  At the depth of the tumor, it was quite stuck to the subcortical white matter and corona radiata, which was likely why patient had such severe motor deficits.  However, a clean dissection was obtained.  The tumor was removed in en bloc manner.  A piece of tumor was cut and sent to frozen section consistent with adenocarcinoma.  Meticulous hemostasis was obtained  and the cavity was inspected no gross tumor was seen.  The dura was reapproximated with 4-0 Nurolon stitches.  DuraGen plus onlay was then placed.  The bone flap was replaced with Stryker cranial plating system.  Wound was irrigated  thoroughly with irrigation.  The galea was closed 2-0 Vicryl stitches in buried interrupted fashion.  The skin was closed with staples followed by sterile dressing.  Patient was then removed from [place] head holder and turned over the anesthesia service and was extubated without incident.  All counts were correct at the end of surgery.  No complications were noted.  Findings: Large tumor with frozen section consistent with adenocarcinoma  Estimated Blood Loss:  50 ml         Drains: none         Blood Given: none          Specimens: Left brain tumor         Implants: Stryker cranial plating system        Complications:  * No complications entered in OR log *         Disposition: PACU - hemodynamically stable.         Condition: stable

## 2020-12-31 ENCOUNTER — Encounter (HOSPITAL_COMMUNITY): Payer: Self-pay | Admitting: Neurosurgery

## 2020-12-31 ENCOUNTER — Inpatient Hospital Stay (HOSPITAL_COMMUNITY): Payer: Medicare Other

## 2020-12-31 LAB — CBC WITH DIFFERENTIAL/PLATELET
Abs Immature Granulocytes: 0.54 10*3/uL — ABNORMAL HIGH (ref 0.00–0.07)
Basophils Absolute: 0.1 10*3/uL (ref 0.0–0.1)
Basophils Relative: 1 %
Eosinophils Absolute: 0 10*3/uL (ref 0.0–0.5)
Eosinophils Relative: 0 %
HCT: 40 % (ref 36.0–46.0)
Hemoglobin: 13.9 g/dL (ref 12.0–15.0)
Immature Granulocytes: 5 %
Lymphocytes Relative: 4 %
Lymphs Abs: 0.5 10*3/uL — ABNORMAL LOW (ref 0.7–4.0)
MCH: 33 pg (ref 26.0–34.0)
MCHC: 34.8 g/dL (ref 30.0–36.0)
MCV: 95 fL (ref 80.0–100.0)
Monocytes Absolute: 0.9 10*3/uL (ref 0.1–1.0)
Monocytes Relative: 8 %
Neutro Abs: 9.1 10*3/uL — ABNORMAL HIGH (ref 1.7–7.7)
Neutrophils Relative %: 82 %
Platelets: 143 10*3/uL — ABNORMAL LOW (ref 150–400)
RBC: 4.21 MIL/uL (ref 3.87–5.11)
RDW: 13.9 % (ref 11.5–15.5)
WBC: 11 10*3/uL — ABNORMAL HIGH (ref 4.0–10.5)
nRBC: 0 % (ref 0.0–0.2)

## 2020-12-31 LAB — BASIC METABOLIC PANEL
Anion gap: 12 (ref 5–15)
BUN: 21 mg/dL (ref 8–23)
CO2: 21 mmol/L — ABNORMAL LOW (ref 22–32)
Calcium: 9.3 mg/dL (ref 8.9–10.3)
Chloride: 105 mmol/L (ref 98–111)
Creatinine, Ser: 0.79 mg/dL (ref 0.44–1.00)
GFR, Estimated: >60 mL/min (ref >60)
Glucose, Bld: 139 mg/dL — ABNORMAL HIGH (ref 70–99)
Potassium: 4.4 mmol/L (ref 3.5–5.1)
Sodium: 138 mmol/L (ref 135–145)

## 2020-12-31 LAB — GLUCOSE, CAPILLARY
Glucose-Capillary: 165 mg/dL — ABNORMAL HIGH (ref 70–99)
Glucose-Capillary: 199 mg/dL — ABNORMAL HIGH (ref 70–99)
Glucose-Capillary: 189 mg/dL — ABNORMAL HIGH (ref 70–99)
Glucose-Capillary: 172 mg/dL — ABNORMAL HIGH (ref 70–99)

## 2020-12-31 LAB — SURGICAL PATHOLOGY

## 2020-12-31 MED ORDER — DEXAMETHASONE 4 MG PO TABS
4.0000 mg | ORAL_TABLET | Freq: Three times a day (TID) | ORAL | Status: AC
  Administered 2020-12-31 – 2021-01-02 (×6): 4 mg via ORAL
  Filled 2020-12-31 (×6): qty 1

## 2020-12-31 MED ORDER — DEXAMETHASONE 4 MG PO TABS
4.0000 mg | ORAL_TABLET | Freq: Two times a day (BID) | ORAL | Status: DC
  Administered 2021-01-02 – 2021-01-03 (×3): 4 mg via ORAL
  Filled 2020-12-31 (×3): qty 1

## 2020-12-31 MED ORDER — ENSURE ENLIVE PO LIQD
237.0000 mL | Freq: Two times a day (BID) | ORAL | Status: DC
  Administered 2021-01-01 – 2021-01-05 (×5): 237 mL via ORAL

## 2020-12-31 MED ORDER — DEXAMETHASONE 0.5 MG PO TABS: 1.0000 mg | ORAL_TABLET | Freq: Two times a day (BID) | ORAL | Status: DC

## 2020-12-31 MED ORDER — CHLORHEXIDINE GLUCONATE CLOTH 2 % EX PADS
6.0000 | MEDICATED_PAD | Freq: Every day | CUTANEOUS | Status: DC
  Administered 2020-12-31 – 2021-01-09 (×7): 6 via TOPICAL

## 2020-12-31 MED ORDER — DEXAMETHASONE 0.5 MG PO TABS: 1.0000 mg | ORAL_TABLET | Freq: Every day | ORAL | Status: DC

## 2020-12-31 MED ORDER — GADOBUTROL 1 MMOL/ML IV SOLN
7.5000 mL | Freq: Once | INTRAVENOUS | Status: AC | PRN
  Administered 2020-12-31: 7.5 mL via INTRAVENOUS

## 2020-12-31 MED ORDER — METHOCARBAMOL 500 MG PO TABS
500.0000 mg | ORAL_TABLET | Freq: Four times a day (QID) | ORAL | Status: DC | PRN
  Administered 2020-12-31 – 2021-01-08 (×10): 500 mg via ORAL
  Filled 2020-12-31 (×10): qty 1

## 2020-12-31 MED ORDER — DEXAMETHASONE 4 MG PO TABS
2.0000 mg | ORAL_TABLET | Freq: Two times a day (BID) | ORAL | Status: DC
  Administered 2021-01-04: 2 mg via ORAL
  Filled 2020-12-31: qty 1

## 2020-12-31 MED ORDER — LEVETIRACETAM 500 MG PO TABS
500.0000 mg | ORAL_TABLET | Freq: Two times a day (BID) | ORAL | Status: DC
  Administered 2020-12-31 – 2021-01-08 (×16): 500 mg via ORAL
  Filled 2020-12-31 (×16): qty 1

## 2020-12-31 NOTE — Progress Notes (Signed)
Subjective: NAEs o/n  Objective: Vital signs in last 24 hours: Temp:  [97 F (36.1 C)-98.3 F (36.8 C)] 98.3 F (36.8 C) (02/03 0800) Pulse Rate:  [51-73] 56 (02/03 1000) Resp:  [7-28] 14 (02/03 1000) BP: (104-155)/(53-105) 141/65 (02/03 1000) SpO2:  [92 %-99 %] 99 % (02/03 1000) Arterial Line BP: (137-187)/(59-78) 187/78 (02/02 1739) Weight:  [66.7 kg] 66.7 kg (02/02 1330)  Intake/Output from previous day: 02/02 0701 - 02/03 0700 In: 2166.4 [I.V.:1816.1; IV Piggyback:300.3] Out: 915 [Urine:905; Blood:10] Intake/Output this shift: No intake/output data recorded.  Alert, oriented to person.  Speech with significant word finding difficulties, some phonemic paraphasias.  Following commands.  RUE with 0/5 strength, RLE 3/5. R VFC, R facial droop.  Mild/moderate right sided hemi-neglect. Dressing intact.  Lab Results: Recent Labs    01/21/2021 0742 12/31/20 0355  WBC 8.5 11.0*  HGB 14.3 13.9  HCT 44.1 40.0  PLT 184 143*   BMET Recent Labs    01/01/2021 0742 12/31/20 0355  NA 136 138  K 4.6 4.4  CL 101 105  CO2 24 21*  GLUCOSE 175* 139*  BUN 27* 21  CREATININE 0.92 0.79  CALCIUM 9.9 9.3    Studies/Results: MR BRAIN W WO CONTRAST  Result Date: 12/31/2020 CLINICAL DATA:  Metastatic disease EXAM: MRI HEAD WITHOUT AND WITH CONTRAST TECHNIQUE: Multiplanar, multiecho pulse sequences of the brain and surrounding structures were obtained without and with intravenous contrast. CONTRAST:  7.71mL GADAVIST GADOBUTROL 1 MMOL/ML IV SOLN COMPARISON:  12/24/2020 FINDINGS: Brain: 3 brain metastases by MRI. The largest, which is at the left frontal parietal junction, has been resected with expected blood products, regional edema, and regional diffusion hyperintensity. No worrisome or nodular adjacent enhancement when allowing for minimal post surgical enhancement especially at the anterior margin. A 16 mm high left frontal mass and a 3 mm high right frontal mass are unchanged. No  hydrocephalus, unexpected collection, or unexpected hemorrhage. There is expected small volume fluid with gas deep to the bone flap, with regional reactive dural thickening. Vascular: Negative Skull and upper cervical spine: Unremarkable left craniotomy Sinuses/Orbits: Negative IMPRESSION: 1. No unexpected finding after left frontal parietal metastasis resection. 2. Smaller bifrontal metastases are unchanged. Electronically Signed   By: Monte Fantasia M.D.   On: 12/31/2020 05:51    Assessment/Plan: Metastatic uterine CA s/p craniotomy for resection of large left parietal lobe metastasis.  She had right hemiparesis and expressive dysphasia preop.  Postoperatively, her neurologic deficits appear worse, but hopefully this is temporary.  Her MRI shows good resection, DWI change in the posterior resection margin.  There is still a fair amount of vasogenic edema.  If her deficits are from residual peritumoral edema, we will likely see at least improvement over the next few weeks.  She likely would benefit from rehab, PT/OT.  Will start DVT prophylaxis today, and will downgrade to stepdown.    Vallarie Mare 12/31/2020, 11:26 AM

## 2020-12-31 NOTE — Progress Notes (Signed)
Oooh thanks- I was trusting Aria to put it in there- guess I shouldn't have :)  I just updated the details. Thanks!

## 2020-12-31 NOTE — Evaluation (Signed)
Clinical/Bedside Swallow Evaluation Patient Details  Name: Sandra Arroyo MRN: [id number] Date of Birth: 1951-04-04  Today's Date: 12/31/2020 Time: SLP Start Time (ACUTE ONLY): 0910 SLP Stop Time (ACUTE ONLY): 0940 SLP Time Calculation (min) (ACUTE ONLY): 30 min  Past Medical History:  Past Medical History:  Diagnosis Date  . Cancer ([place]) 2019  . Diabetes mellitus without complication ([place])   . Family history of adverse reaction to anesthesia   . Hyperparathyroidism, unspecified ([place])   . Hypertension   . PONV (postoperative nausea and vomiting)    Past Surgical History:  Past Surgical History:  Procedure Laterality Date  . ABDOMINAL HYSTERECTOMY    . CESAREAN SECTION    . CHOLECYSTECTOMY    . PARATHYROIDECTOMY    . TONSILLECTOMY     HPI:  70yo female admitted 01/05/2021 with right side weakness, speech difficulty. Pt was found to have metastatic brain disease with 3 lesions PMH: uterine, lung cancer, DM, hyperparathyroidism, essential HTN. Pt underwent left frontoparietal craniotomy for resection of tumor   Assessment / Plan / Recommendation Clinical Impression  Pt presents with adequate natural dentition. Oral care was completed with suction, which pt tolerated well. Pt was unable to participate in CN exam due to apraxia. There appears to be slight right inattention, weakness or sensory change as evidenced by right side residue just outside her mouth. Pt is left handed at baseline, and was able to self feed with set up and min assist. Pt exhibited adequate oral prep. Swallow reflex appeared timely, and no overt s/s aspiration observed on any texture given. RN was present with PO meds, which pt tolerated whole with puree. Recommend dys 2 (finely chopped) diet and thin liquids, meds whole in puree. 1:1 assistance/set up is recommended during PO intake due to RUE weakness. Safe swallow precautions posted at [facility]. RN and MD informed. SLP will follow to assess diet tolerance and determine  readiness for advanced textures. Speech language evaluation is also recommended, as expressive aphasia and apraxia were noted during swallow evaluation.   SLP Visit Diagnosis: Dysphagia, unspecified (R13.10)    Aspiration Risk  Mild aspiration risk    Diet Recommendation Dysphagia 2 (Fine chop);Thin liquid   Liquid Administration via: Cup;Straw Medication Administration: Whole meds with liquid Supervision: Patient able to self feed;Staff to assist with self feeding;Full supervision/cueing for compensatory strategies Compensations: Minimize environmental distractions;Slow rate;Small sips/bites Postural Changes: Seated upright at 90 degrees    Other  Recommendations Oral Care Recommendations: Oral care BID   Follow up Recommendations  (Pt may benefit from continued therapy for aphasia and apraxia)      Frequency and Duration min 2x/week  1 week;2 weeks       Prognosis Prognosis for Safe Diet Advancement: Good Barriers to Reach Goals: Language deficits;Motivation      Swallow Study   General Date of Onset: 12/31/20 HPI: 70yo female admitted 01/06/2021 with right side weakness, speech difficulty. Pt was found to have metastatic brain disease with 3 lesions PMH: uterine, lung cancer, DM, hyperparathyroidism, essential HTN. Pt underwent left frontoparietal craniotomy for resection of tumor Type of Study: Bedside Swallow Evaluation Previous Swallow Assessment: none Diet Prior to this Study: NPO Temperature Spikes Noted: No Respiratory Status: Nasal cannula History of Recent Intubation: Yes (during surgery) Length of Intubations (days): 1 days Date extubated: 01/21/2021 Behavior/Cognition: Alert;Cooperative;Pleasant mood Oral Cavity Assessment: Within Functional Limits Oral Care Completed by SLP: Yes Oral Cavity - Dentition: Adequate natural dentition Vision: Functional for self-feeding Self-Feeding Abilities: Able to feed self;Needs assist;Needs  set up Patient Positioning: Upright  in bed Baseline Vocal Quality: Normal Volitional Cough: Weak Volitional Swallow: Able to elicit    Oral/Motor/Sensory Function Overall Oral Motor/Sensory Function: Mild impairment   Ice Chips Ice chips: Within functional limits Presentation: Spoon   Thin Liquid Thin Liquid: Within functional limits Presentation: Straw;Self Fed    Puree Puree: Within functional limits Presentation: Self Fed Other Comments: pt requires assistance with self feeding, due to RUE weakness   Solid     Solid: Within functional limits Presentation: Petrolia B. Quentin Ore, [facility], [place] Speech Language Pathologist Office: [phone] Pager: [phone]  Shonna Chock 12/31/2020,10:38 AM

## 2020-12-31 NOTE — Progress Notes (Signed)
Initial Nutrition Assessment  DOCUMENTATION CODES:   Not applicable  INTERVENTION:   Ensure Enlive po BID, each supplement provides 350 kcal and 20 grams of protein  Magic cup TID with meals, each supplement provides 290 kcal and 9 grams of protein    NUTRITION DIAGNOSIS:   Increased nutrient needs related to cancer and cancer related treatments as evidenced by estimated needs.  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 Pt admitted for palliative resection.   2/2 s/p L crani for resection   Medications reviewed and include: decadron, colace, SSI Labs reviewed    Diet Order:   Diet Order            DIET DYS 2 Room service appropriate? Yes; Fluid consistency: Thin  Diet effective now                 EDUCATION NEEDS:   No education needs have been identified at this time  Skin:  Skin Assessment: Reviewed RN Assessment  Last BM:  unknown  Height:   Ht Readings from Last 1 Encounters:  01/12/2021 5\' 2"  (1.575 m)    Weight:   Wt Readings from Last 1 Encounters:  01/06/2021 66.7 kg    Ideal Body Weight:  50 kg  BMI:  Body mass index is 26.9 kg/m.  Estimated Nutritional Needs:   Kcal:  1700-1900  Protein:  85-95 grams  Fluid:  >1.7 L/day  Lockie Pares., RD, LDN, CNSC See AMiON for contact information

## 2021-01-01 LAB — GLUCOSE, CAPILLARY
Glucose-Capillary: 163 mg/dL — ABNORMAL HIGH (ref 70–99)
Glucose-Capillary: 178 mg/dL — ABNORMAL HIGH (ref 70–99)
Glucose-Capillary: 217 mg/dL — ABNORMAL HIGH (ref 70–99)
Glucose-Capillary: 165 mg/dL — ABNORMAL HIGH (ref 70–99)

## 2021-01-01 MED ORDER — INSULIN ASPART 100 UNIT/ML ~~LOC~~ SOLN
0.0000 [IU] | Freq: Three times a day (TID) | SUBCUTANEOUS | Status: DC
  Administered 2021-01-01: 3 [IU] via SUBCUTANEOUS
  Administered 2021-01-01: 5 [IU] via SUBCUTANEOUS
  Administered 2021-01-01: 3 [IU] via SUBCUTANEOUS
  Administered 2021-01-02: 2 [IU] via SUBCUTANEOUS
  Administered 2021-01-02: 3 [IU] via SUBCUTANEOUS
  Administered 2021-01-02: 2 [IU] via SUBCUTANEOUS
  Administered 2021-01-03 (×2): 3 [IU] via SUBCUTANEOUS
  Administered 2021-01-03 – 2021-01-05 (×5): 2 [IU] via SUBCUTANEOUS
  Administered 2021-01-05: 3 [IU] via SUBCUTANEOUS
  Administered 2021-01-05: 5 [IU] via SUBCUTANEOUS
  Administered 2021-01-06 – 2021-01-07 (×3): 3 [IU] via SUBCUTANEOUS

## 2021-01-01 NOTE — Evaluation (Signed)
Speech Language Pathology Evaluation Patient Details Name: Sandra Arroyo MRN: [id number] DOB: 01/31/1951 Today's Date: 01/01/2021 Time: 3710-6269 SLP Time Calculation (min) (ACUTE ONLY): 25 min  Problem List:  Patient Active Problem List   Diagnosis Date Noted  . Status post craniotomy 01/28/2021  . Brain metastasis ([place]) 12/23/2020  . Metastasis to brain ([place]) 12/22/2020  . Acquired hypothyroidism 12/11/2020  . Chronic diarrhea 12/01/2020  . Skin rash 12/01/2020  . 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 Type 2 diabetes mellitus with hyperglycemia, without long-term current use of insulin ([place]) 10/02/2020  . Hyperparathyroidism, unspecified ([place])   . Port-A-Cath in place 01/13/2020  . Pulmonary infiltrates 12/19/2019  . Kidney lesion, native, left 12/19/2019  . Goals of care, counseling/discussion 12/12/2019  . Uterine cancer ([place]) 12/11/2019  . 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 Other fatigue 12/11/2019  . Pancytopenia, acquired ([place]) 12/11/2019  . Family history of cancer 12/11/2019  . Diabetes mellitus without complication, without long-term current use of insulin ([place]) 12/11/2019  . Essential hypertension 12/11/2019  . Peripheral neuropathy due to chemotherapy ([place]) 12/11/2019   Past Medical History:  Past Medical History:  Diagnosis Date  . Cancer ([place]) 2019  . Diabetes mellitus without complication ([place])   . Family history of adverse reaction to anesthesia   . Hyperparathyroidism, unspecified ([place])   . Hypertension   . PONV (postoperative nausea and vomiting)    Past Surgical History:  Past Surgical History:  Procedure Laterality Date  . ABDOMINAL HYSTERECTOMY    . APPLICATION OF CRANIAL NAVIGATION N/A 01/28/21   Procedure: APPLICATION OF CRANIAL NAVIGATION;  Surgeon: Vallarie Mare, MD;  Location: [facility];  Service: Neurosurgery;  Laterality: N/A;  . CESAREAN SECTION    . CHOLECYSTECTOMY    . CRANIOTOMY Left 2021/01/28   Procedure: LEFT CRANIOTOMY FOR  RESECTON OF TUMOR WITH BRAIN LAB;  Surgeon: Vallarie Mare, MD;  Location: [facility];  Service: Neurosurgery;  Laterality: Left;  . PARATHYROIDECTOMY    . TONSILLECTOMY     HPI:  70yo female admitted 01-28-2021 with right side weakness, speech difficulty. Pt was found to have metastatic brain disease with 3 lesions PMH: uterine, lung cancer, DM, hyperparathyroidism, essential HTN. Pt underwent left frontoparietal craniotomy for resection of tumor   Assessment / Plan / Recommendation Clinical Impression  Pt presents with aphasia c/w a Broca's type aphasia in setting of recent craniotomy for resection of large metastatic lesion in left parietal lobe.  Pt was assessed using the Western Aphasia Battery - Bedside form (see below for additional information) with some modifications (items not administered, subtests discontinued 2/2 poor performance to decrease frustration).   Her expressive language is severely impaired.  Pt was able to participate in social greeting appropriately, but other spontaneous language was incoherent.  Pt's speech is largy marked by stereotypy/jargon. With picture description task pt pointed to salient items in pictures, but was unable to provide verbal description. Pt could not repeat word phrases, but some initial phonemes were correct.  With confrontational naming task, pt correctly named 1 of 10 items.  She was ablel to name ad addtional 2 items with cuing.  Semantic paraphasia noted x1 ("head" for "hair") with pt benefiting from phonemic cuing.  Pt completed one further item with semantic cue/sentence completion.  Pt eagerly participated in automatic speech tasks getting out ahead of SLP in choral tasks.  Pt was able to get the correct word initial phonemes in counting and benefited from SLP model for some numerals.  In singing task, pt was able to carry melody and attempted to sing lyrics with excellent effort.  Pt became tearful during this task.  Pt does appear to be aware of her  errors and expresses frustration.   Receptive language is relatively spared.  Pt answered yes/no questions with 80 percent accuracy.  Pt was noted to self correct on several occaisions with more complex questions.  Pt stopped SLP for administering the next item until she was able to change her answer.  Pt exhibited ability to communicate her basic wants/needs through paired yes/no questions prior to SLP departure.  Pt was unable to follow 1 or 2 step commands, despite verbal, visual, and tactile cuing.  Pt did not benefit from SLP model.    Pt was eager to attempt writing.  When given pen pt's spontaneous writing showed correct letter formation, but no words.  Pt was able to writ her name when asked, but not her address.  which she attempted, but indicated she was unable to do.  Pt's reading was difficult to assess, but she did not benefit from written support for one step commands.    Motor speech could not be assessed 2/2 severity of language deficits.  In known context when pt was able to correctly name items, her speech was clear.  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   SLP will work with pt to address deficits noted above.  Suspect pt will need intensive speech therapy after discharge as well.  Pt is a good candidate for speech therapy given her awareness of deficits and eagerness to participate in speech tasks. Pt will also likely benefit from further assessment of cognitive function as language ability improves.   WAB - Bedside Spontaneous speech - content: 1/10 Spontaneous speech - fluency: 1/10 Yes/No questions: 8/10 Sequential Commands: 0/10 Repetition: 0/10 Naming: 1/10  Bedside Aphasia Score: 18.3/100  Reading: 0/10 Writing: 1/10  Bedside Language Score: 15/100     SLP Assessment  SLP Recommendation/Assessment: Patient needs continued Speech Lanaguage  Pathology Services SLP Visit Diagnosis: Aphasia (R47.01)    Follow Up Recommendations  Inpatient Rehab    Frequency and Duration   2x week; 2 weeks        SLP Evaluation Cognition  Overall Cognitive Status: Difficult to assess (Not assessed 2/2 severity of language impairment) Orientation Level: Oriented to person       Comprehension  Auditory Comprehension Overall Auditory Comprehension: Impaired Yes/No Questions: Impaired Basic Biographical Questions: 76-100% accurate Basic Immediate Environment Questions: 50-74% accurate Complex Questions: 75-100% accurate Commands: Impaired One Step Basic Commands: 0-24% accurate Two Step Basic Commands: 0-24% accurate EffectiveTechniques: Extra processing time Reading Comprehension Reading Status: Impaired Sentence Level: Impaired    Expression Expression Primary Mode of Expression: Verbal Verbal Expression Overall Verbal Expression: Impaired Initiation: No impairment Automatic Speech: Name Level of Generative/Spontaneous Verbalization: Word Repetition: Impaired Level of Impairment: Word level Naming: Impairment Confrontation: Impaired Verbal Errors: Phonemic paraphasias;Aware of errors   Oral / Motor  Motor Speech Overall Motor Speech: Impaired (presumed impairment)   [place], [place], [place] Office: [phone] 01/01/2021, 10:42 AM

## 2021-01-01 NOTE — Progress Notes (Signed)
  Speech Language Pathology Treatment: Dysphagia  Patient Details Name: Ezma Rehm MRN: [id number] DOB: 07-22-1951 Today's Date: 01/01/2021 Time: 3846-6599 SLP Time Calculation (min) (ACUTE ONLY): 5 min  Assessment / Plan / Recommendation Clinical Impression  RN reports good tolerance of AM meal.  Today pt consumed thin liquid, soft solid, and regular solid textures with no clinical s/s of aspiration.  Pt exhibited excellent oral clearance of solids.  When asked to open mouth so SLP could visualize oral cavity, pt clamped mouth closed.  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  Pt benefited from verbal and visual cuing.  Recommend advancing to regular texture diet with thin liquid.   HPI HPI: 70yo female admitted 01/23/2021 with right side weakness, speech difficulty. Pt was found to have metastatic brain disease with 3 lesions PMH: uterine, lung cancer, DM, hyperparathyroidism, essential HTN. Pt underwent left frontoparietal craniotomy for resection of tumor      SLP Plan  Continue with current plan of care       Recommendations  Diet recommendations: Regular;Thin liquid Liquids provided via: Cup;Straw Medication Administration: Whole meds with liquid Supervision: Patient able to self feed;Staff to assist with self feeding Compensations: Slow rate;Small sips/bites Postural Changes and/or Swallow Maneuvers: Seated upright 90 degrees                Oral Care Recommendations: Oral care BID SLP Visit Diagnosis: Dysphagia, unspecified (R13.10) Plan: Continue with current plan of care       [place], [place], [place] Office: [phone] 01/01/2021, 10:11 AM

## 2021-01-01 NOTE — Evaluation (Signed)
Physical Therapy Evaluation Patient Details Name: Sandra Arroyo MRN: [id number] DOB: Apr 23, 1951 Today's Date: 01/01/2021   History of Present Illness  70 year old female presenting s/p left frontoparietal craniotomy for resection of tumor on 2/2 to correct itracranial metastases with some mass-effect that had been identified 12/23/2020. 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    Clinical Impression  Pt in bed upon arrival of PT, agreeable to evaluation at this time. Prior to admission the pt was independent with mobility, staying with sister or spouse at all times depending on cancer treatments. The pt now presents with limitations in functional mobility, strength, awareness, power, and stability due to above dx, and will continue to benefit from skilled PT to address these deficits. The pt was able to tolerate transition to sitting EOB and then pivot to [facility], but requires maxA of 2 to complete due to weakness on R-side and deficits in attention/neglect and stability. The pt will continue to benefit from skilled PT to progress functional mobility and initiate gait training, and would benefit from CIR level therapies to achieve these mobility goals.      Follow Up Recommendations CIR    Equipment Recommendations   (defer to post acute)    Recommendations for Other Services Rehab consult     Precautions / Restrictions Precautions Precautions: Fall Precaution Comments: R hemiplegia Restrictions Weight Bearing Restrictions: No      Mobility  Bed Mobility Overal bed mobility: Needs Assistance Bed Mobility: Supine to Sit;Sit to Supine     Supine to sit: +2 for physical assistance;Mod assist Sit to supine: +2 for physical assistance;Max assist   General bed mobility comments: pt requires cues for exiting on the R side of the bed and pad used to bring hips to EOB. pt with strong R lean.  pt requires max (A) at EOB pt requires (A) for bil LE back on and off bed surface.    Transfers Overall transfer level: Needs assistance Equipment used: 2 person hand held assist Transfers: Sit to/from [facility] Sit to Stand: +2 physical assistance;Max assist Stand pivot transfers: +2 physical assistance;Max assist       General transfer comment: pt motivated to transfer to 3n1 and due to tone in R LE blocking required but able to activate with standing. Pt wtih R LE moved toward L LE stepping L direction. pt requires total +2 max (A) to control descend to 3n1. pt needs increased (A) to transfer R side due to inattention  Ambulation/Gait             General Gait Details: unable at this time, relied on pivot transfer with assist of 2      Balance Overall balance assessment: Needs assistance Sitting-balance support: Single extremity supported;Feet supported Sitting balance-Leahy Scale: Zero   Postural control: Right lateral lean Standing balance support: Single extremity supported;During functional activity Standing balance-Leahy Scale: Poor                               Pertinent Vitals/Pain Pain Assessment: No/denies pain Faces Pain Scale: No hurt    Home Living Family/patient expects to be discharged to:: Private residence Living Arrangements: Spouse/significant other;Other relatives (staying with sister in [place] but lives in a different home with spouse. only stays with sister during CA treatments) Available Help at Discharge: Family;Available 24 hours/day Type of Home: [place]  Access: Level entry Entrance Stairs-Rails: Left;Right Entrance Stairs-Number of Steps: 0 Home Layout: One level Home Equipment: None Additional Comments: information is the sister home. Regular 4 stairs at home to main level and tub shower combo    Prior Function Level of Independence: Independent               Hand Dominance   Dominant  Hand: Left    Extremity/Trunk Assessment   Upper Extremity Assessment Upper Extremity Assessment: Defer to OT evaluation    Lower Extremity Assessment Lower Extremity Assessment: Generalized weakness;RLE deficits/detail RLE Deficits / Details: tone present at R knee extensors and hip flexors, able to achieve full PROM to ankle. able to activate R knee extensors, but unable to achieve heel slide or SLR without assist RLE Sensation: decreased proprioception RLE Coordination: decreased fine motor;decreased gross motor    Cervical / Trunk Assessment Cervical / Trunk Assessment: Other exceptions Cervical / Trunk Exceptions: s/p surgery L side  Communication   Communication: No difficulties  Cognition Arousal/Alertness: Awake/alert Behavior During Therapy: WFL for tasks assessed/performed Overall Cognitive Status: Difficult to assess                                 General Comments: pt accurately answering yes no questions but with delays      General Comments General comments (skin integrity, edema, etc.): pt voiding bladder on 3n1 at this time and RN made aware it was >300 cc but not measured for accuracy    Exercises     Assessment/Plan    PT Assessment Patient needs continued PT services  PT Problem List Decreased strength;Decreased mobility;Decreased activity tolerance;Decreased balance;Impaired sensation;Decreased coordination;Decreased safety awareness       PT Treatment Interventions DME instruction;Therapeutic activities;Gait training;Therapeutic exercise;Patient/family education;Balance training;Stair training;Functional mobility training;Neuromuscular re-education    PT Goals (Current goals can be found in the Care Plan section)  Acute Rehab PT Goals Patient Stated Goal: none stated at this time but expressed "feel better after moving" PT Goal Formulation: With patient Time For Goal Achievement: 01/15/21 Potential to Achieve Goals: Good     Frequency Min 4X/week   Barriers to discharge        Co-evaluation PT/OT/SLP Co-Evaluation/Treatment: Yes Reason for Co-Treatment: Complexity of the patient's impairments (multi-system involvement);For patient/therapist safety;To address functional/ADL transfers PT goals addressed during session: Mobility/safety with mobility;Balance         AM-PAC PT "6 Clicks" Mobility  Outcome Measure Help needed turning from your back to your side while in a flat bed without using bedrails?: A Lot Help needed moving from lying on your back to sitting on the side of a flat bed without using bedrails?: A Lot Help needed moving to and from a bed to a chair (including a wheelchair)?: A Lot Help needed standing up from a chair using your arms (e.g., wheelchair or bedside chair)?: A Lot Help needed to walk in hospital room?: Total Help needed climbing 3-5 steps with a railing? : Total 6 Click Score: 10    End of Session Equipment Utilized During Treatment: Gait belt Activity Tolerance: Patient tolerated treatment well Patient left: in bed;with call bell/phone within reach;with bed alarm set;with family/visitor present Nurse Communication: Mobility status PT Visit Diagnosis: Hemiplegia and hemiparesis;Other abnormalities of gait and mobility (R26.89) Hemiplegia - Right/Left: Right Hemiplegia - dominant/non-dominant: Dominant Hemiplegia - caused by: Unspecified    Time: 0539-7673 PT Time Calculation (min) (ACUTE ONLY): 35 min  Charges:   PT Evaluation $PT Eval Moderate Complexity: 1 Mod          Karma Ganja, PT, DPT   Acute Rehabilitation Department Pager #: [phone]  Otho Bellows 01/01/2021, 5:00 PM

## 2021-01-01 NOTE — Progress Notes (Signed)
Subjective: Patient reports no headaches.  Objective: Vital signs in last 24 hours: Temp:  [97.5 F (36.4 C)-98.2 F (36.8 C)] 97.5 F (36.4 C) (02/04 0800) Pulse Rate:  [45-65] 58 (02/04 0900) Resp:  [10-28] 14 (02/04 0900) BP: (112-173)/(52-114) 142/77 (02/04 0900) SpO2:  [91 %-100 %] 95 % (02/04 0900)  Intake/Output from previous day: 02/03 0701 - 02/04 0700 In: 1816.8 [I.V.:1666.8; IV Piggyback:150] Out: 2350 [Urine:2350] Intake/Output this shift: Total I/O In: 463.5 [P.O.:240; I.V.:223.5] Out: -   NAD Eating breakfast, appears comfortable. Alert, oriented to name.  Significant speech output difficulty, with phonemic paraphasias, word finding difficulty. RUE 0/5 strength, right facial droop, R VFC. RLE 3/5 Incision dressing intact  Lab Results: Recent Labs    01/17/2021 0742 12/31/20 0355  WBC 8.5 11.0*  HGB 14.3 13.9  HCT 44.1 40.0  PLT 184 143*   BMET Recent Labs    01/21/2021 0742 12/31/20 0355  NA 136 138  K 4.6 4.4  CL 101 105  CO2 24 21*  GLUCOSE 175* 139*  BUN 27* 21  CREATININE 0.92 0.79  CALCIUM 9.9 9.3    Studies/Results: MR BRAIN W WO CONTRAST  Result Date: 12/31/2020 CLINICAL DATA:  Metastatic disease EXAM: MRI HEAD WITHOUT AND WITH CONTRAST TECHNIQUE: Multiplanar, multiecho pulse sequences of the brain and surrounding structures were obtained without and with intravenous contrast. CONTRAST:  7.77m GADAVIST GADOBUTROL 1 MMOL/ML IV SOLN COMPARISON:  12/24/2020 FINDINGS: Brain: 3 brain metastases by MRI. The largest, which is at the left frontal parietal junction, has been resected with expected blood products, regional edema, and regional diffusion hyperintensity. No worrisome or nodular adjacent enhancement when allowing for minimal post surgical enhancement especially at the anterior margin. A 16 mm high left frontal mass and a 3 mm high right frontal mass are unchanged. No hydrocephalus, unexpected collection, or unexpected hemorrhage. There is  expected small volume fluid with gas deep to the bone flap, with regional reactive dural thickening. Vascular: Negative Skull and upper cervical spine: Unremarkable left craniotomy Sinuses/Orbits: Negative IMPRESSION: 1. No unexpected finding after left frontal parietal metastasis resection. 2. Smaller bifrontal metastases are unchanged. Electronically Signed   By: JMonte FantasiaM.D.   On: 12/31/2020 05:51    Assessment/Plan: Metastatic uterine cancer with CNS disease s/p SRS and resection of large frontoparietal lobe met - awaiting stepdown bed - PT/OT, may be rehab candidate - steroid taper  JVallarie Mare2/02/2021, 10:32 AM

## 2021-01-01 NOTE — Progress Notes (Signed)
OT EVALUATION  Patient is s/p L frontoparietal craniotomy surgery resulting in functional limitations due to the deficits listed below (see OT problem list). Pt currently with R hemiplegia requiring total +2 max (A) for most transfers. Pt could reach 1 person wheelchair level/ drop arm commode level with CIR. Pt has family support from sister children and spouse. Pt will likely require w/c , w/c cushion, drop arm commode and possible hoyer for d/ c home. Question the benefit of a palliative consult as pt was undergoing CA treatment prior to surgery. Pt has sister present in room and recommend asking patient directly.  Patient will benefit from skilled OT acutely to increase independence and safety with ADLS to allow discharge CIR.   01/01/21 1200  OT Visit Information  Last OT Received On 01/01/21  Assistance Needed +2  PT/OT/SLP Co-Evaluation/Treatment Yes  Reason for Co-Treatment Complexity of the patient's impairments (multi-system involvement);For patient/therapist safety;To address functional/ADL transfers  OT goals addressed during session ADL's and self-care;Proper use of Adaptive equipment and DME;Strengthening/ROM  History of Present Illness 70 year old female presenting s/p left frontoparietal craniotomy for resection of tumor on 2/2 to correct itracranial metastases with some mass-effect that had been identified 12/23/2020. 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  Precautions  Precautions Fall  Precaution Comments R hemiplegia  Home Living  Family/patient expects to be discharged to: Private residence  Living Arrangements Spouse/significant other;Other relatives (staying with sister in [place] but lives in a different home with spouse. only stays with sister during CA treatments)  Available Help at Discharge Family;Available 24 hours/day  Type of Home House  Home Access Level  entry  Entrance Stairs-Number of Steps 0  Entrance Stairs-Rails Left;Right  Home Layout One level  Bathroom Therapist, music None  Additional Comments information is the sister home. Regular 4 stairs at home to main level and tub shower combo  Prior Function  Level of Independence Independent  Communication  Communication No difficulties  Pain Assessment  Pain Assessment No/denies pain  Faces Pain Scale 0  Cognition  Arousal/Alertness Awake/alert  Behavior During Therapy WFL for tasks assessed/performed  Overall Cognitive Status Difficult to assess  General Comments pt accurately answering yes no questions but with delays  Difficult to assess due to Impaired communication  Upper Extremity Assessment  Upper Extremity Assessment RUE deficits/detail  RUE Deficits / Details flaccid with tone present. Will likely need soft pillow splint and resting hand splint  RUE Sensation decreased proprioception  RUE Coordination decreased fine motor;decreased gross motor  Lower Extremity Assessment  Lower Extremity Assessment Defer to PT evaluation;RLE deficits/detail  RLE Deficits / Details tone present activation noted at quad  Cervical / Trunk Assessment  Cervical / Trunk Assessment Other exceptions  Cervical / Trunk Exceptions s/p surgery L side  ADL  Overall ADL's  Needs assistance/impaired  Eating/Feeding Moderate assistance;Bed level  Grooming Moderate assistance;Bed level  Lower Body Dressing Maximal assistance  Lower Body Dressing Details (indicate cue type and reason) don socks able to lift leg to help  Vision- History  Baseline Vision/History Wears glasses  Wears Glasses Reading only  Perception  Perception Tested? Yes  Perception Deficits Inattention/neglect  Inattention/Neglect Does not attend to right visual field;Does not attend to right side of body;Impaired- to be further tested in functional context  Bed Mobility   Overal bed mobility Needs Assistance  Bed Mobility Supine  to Sit;Sit to Supine  Supine to sit +2 for physical assistance;Mod assist  Sit to supine +2 for physical assistance;Max assist  General bed mobility comments pt requires cues for exiting on the R side of the bed and pad used to bring hips to EOB. pt with strong R lean. pt requires max (A) at EOB pt requires (A) for bil LE back on and off bed surface.  Transfers  Overall transfer level Needs assistance  Equipment used 2 person hand held assist  Transfers Sit to/from [employer] Transfers  Sit to Stand +2 physical assistance;Max assist  Stand pivot transfers +2 physical assistance;Max assist  General transfer comment pt motivated to transfer to 3n1 and due to tone in R LE blocking required but able to activate with standing. Pt wtih R LE moved toward L LE stepping L direction. pt requires total +2 max (A) to control descend to 3n1. pt needs increased (A) to transfer R side due to inattention  Balance  Overall balance assessment Needs assistance  Sitting-balance support Single extremity supported;Feet supported  Sitting balance-Leahy Scale Zero  Postural control Right lateral lean  Standing balance support Single extremity supported;During functional activity  Standing balance-Leahy Scale Poor  General Comments  General comments (skin integrity, edema, etc.) pt voiding bladder on 3n1 at this time and RN made aware it was >300 cc but not measured for accuracy  OT - End of Session  Activity Tolerance Patient tolerated treatment well  Patient left in bed;with call bell/phone within reach;with bed alarm set;with family/visitor present;with SCD's reapplied  Nurse Communication Mobility status;Precautions  OT Assessment  OT Recommendation/Assessment Patient needs continued OT Services  OT Visit Diagnosis Unsteadiness on feet (R26.81);Muscle weakness (generalized) (M62.81);Hemiplegia and hemiparesis  Hemiplegia - Right/Left Right   Hemiplegia - dominant/non-dominant Non-Dominant  Hemiplegia - caused by Unspecified  OT Problem List Decreased strength;Decreased range of motion;Decreased activity tolerance;Impaired balance (sitting and/or standing);Impaired vision/perception;Decreased coordination;Decreased cognition;Decreased safety awareness;Decreased knowledge of use of DME or AE;Decreased knowledge of precautions;Impaired tone;Impaired sensation;Obesity;Impaired UE functional use  OT Plan  OT Frequency (ACUTE ONLY) Min 2X/week  OT Treatment/Interventions (ACUTE ONLY) Self-care/ADL training;Therapeutic exercise;Neuromuscular education;Energy conservation;DME and/or AE instruction;Manual therapy;Modalities;Splinting;Therapeutic activities;Cognitive remediation/compensation;Visual/perceptual remediation/compensation;Patient/family education;Balance training  AM-PAC OT "6 Clicks" Daily Activity Outcome Measure (Version 2)  Help from another person eating meals? 2  Help from another person taking care of personal grooming? 2  Help from another person toileting, which includes using toliet, bedpan, or urinal? 2  Help from another person bathing (including washing, rinsing, drying)? 2  Help from another person to put on and taking off regular upper body clothing? 2  Help from another person to put on and taking off regular lower body clothing? 2  6 Click Score 12  OT Recommendation  Recommendations for Other Services Rehab consult  Follow Up Recommendations CIR  OT Equipment Wheelchair (measurements OT);Wheelchair cushion (measurements OT);3 in 1 bedside commode  Individuals Consulted  Consulted and Agree with Results and Recommendations Family member/caregiver;Patient  Family Member Consulted sister  Acute Rehab OT Goals  Patient Stated Goal none stated at this time but expressed "feel better after moving"  OT Goal Formulation With patient/family  Time For Goal Achievement 01/15/21  Potential to Achieve Goals Good  OT  Time Calculation  OT Start Time (ACUTE ONLY) 1245  OT Stop Time (ACUTE ONLY) 1316  OT Time Calculation (min) 31 min  OT General Charges  $OT Visit 1 Visit  OT Evaluation  $OT Eval Moderate Complexity 1 Mod  Written  Expression  Dominant Hand Left   Brynn, OTR/L  Acute Rehabilitation Services Pager: [phone] Office: [phone] .

## 2021-01-02 LAB — GLUCOSE, CAPILLARY
Glucose-Capillary: 142 mg/dL — ABNORMAL HIGH (ref 70–99)
Glucose-Capillary: 199 mg/dL — ABNORMAL HIGH (ref 70–99)
Glucose-Capillary: 137 mg/dL — ABNORMAL HIGH (ref 70–99)
Glucose-Capillary: 195 mg/dL — ABNORMAL HIGH (ref 70–99)

## 2021-01-02 NOTE — Plan of Care (Signed)

## 2021-01-02 NOTE — Progress Notes (Signed)
Physical Therapy Treatment Patient Details Name: Sandra Arroyo MRN: [id number] DOB: Apr 11, 1951 Today's Date: 01/02/2021    History of Present Illness 70 year old female presenting s/p left frontoparietal craniotomy for resection of tumor on 2/2 to correct itracranial metastases with some mass-effect that had been identified 12/23/2020. 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    PT Comments    The pt was eager to participate with PT session today, and was able to practice seated balance as well as multiple sit-stand transfers. The pt continues to present with deficits in strength, motor planning, coordination, and balance that limit her ability to complete bed mobility or transfers without significant assist of 2 at this time. The pt remains motivated, and was able to complete multiple seated balance challenges to improve ability to maintain midline with static sitting. The pt will continue to benefit from skilled PT to address these deficits, and continues to be a good candidate for cir level therapies to progress functional mobility and decrease caregiver burden.    Follow Up Recommendations  CIR     Equipment Recommendations  None recommended by PT    Recommendations for Other Services       Precautions / Restrictions Precautions Precautions: Fall Precaution Comments: R hemiplegia, aphasia Restrictions Weight Bearing Restrictions: No    Mobility  Bed Mobility Overal bed mobility: Needs Assistance Bed Mobility: Supine to Sit;Sit to Supine     Supine to sit: Mod assist;+2 for physical assistance Sit to supine: Mod assist;+2 for physical assistance   General bed mobility comments: pt requries cues for LE movement and modA to BLE to complete movement. modA to facilitate pivot with use of pad, and modA to mobilize trunk from [place] Overall transfer level: Needs  assistance Equipment used: 2 person hand held assist Transfers: Sit to/from Stand Sit to Stand: Max assist;+2 physical assistance         General transfer comment: maxA of 2 to complete sit-stand trom EOB x2. cues and facilitation to improve posture and upright instanding. limited to 3-5 seconds standing due to fatigue  Ambulation/Gait             General Gait Details: unable at this time       Balance Overall balance assessment: Needs assistance Sitting-balance support: Single extremity supported;Feet supported Sitting balance-Leahy Scale: Zero Sitting balance - Comments: pt with strong R lateral lean and pushing from L, worked on propping on L elbow, but requires max facilitation to achieve due to pt with poor motor planning and coordination, leaning forwards when asked to reach with LUE resulting in need for maxA to maintain sitting Eob Postural control: Right lateral lean Standing balance support: Bilateral upper extremity supported Standing balance-Leahy Scale: Poor Standing balance comment: maxA of 2                            Cognition Arousal/Alertness: Awake/alert Behavior During Therapy: WFL for tasks assessed/performed Overall Cognitive Status: Difficult to assess                                 General Comments: pt accurately answering yes/no questions, but with increased frustration due to speech difficulties today      Exercises      General Comments General comments (skin integrity, edema, etc.): VSS on  RA      Pertinent Vitals/Pain Pain Assessment: Faces Faces Pain Scale: Hurts a little bit Pain Location: generalized grimace with mobility Pain Descriptors / Indicators: Grimacing Pain Intervention(s): Monitored during session;Repositioned           PT Goals (current goals can now be found in the care plan section) Acute Rehab PT Goals Patient Stated Goal: none stated at this time but expressed "feel better after  moving" PT Goal Formulation: With patient Time For Goal Achievement: 01/15/21 Potential to Achieve Goals: Good Progress towards PT goals: Progressing toward goals    Frequency    Min 4X/week      PT Plan Current plan remains appropriate       AM-PAC PT "6 Clicks" Mobility   Outcome Measure  Help needed turning from your back to your side while in a flat bed without using bedrails?: A Lot Help needed moving from lying on your back to sitting on the side of a flat bed without using bedrails?: A Lot Help needed moving to and from a bed to a chair (including a wheelchair)?: A Lot Help needed standing up from a chair using your arms (e.g., wheelchair or bedside chair)?: A Lot Help needed to walk in hospital room?: Total Help needed climbing 3-5 steps with a railing? : Total 6 Click Score: 10    End of Session Equipment Utilized During Treatment: Gait belt Activity Tolerance: Patient tolerated treatment well Patient left: in bed;with call bell/phone within reach;with bed alarm set;with family/visitor present Nurse Communication: Mobility status PT Visit Diagnosis: Hemiplegia and hemiparesis;Other abnormalities of gait and mobility (R26.89) Hemiplegia - Right/Left: Right Hemiplegia - dominant/non-dominant: Dominant Hemiplegia - caused by: Unspecified     Time: 0240-9735 PT Time Calculation (min) (ACUTE ONLY): 28 min  Charges:  $Therapeutic Activity: 23-37 mins                     Karma Ganja, PT, DPT   Acute Rehabilitation Department Pager #: [phone]   Otho Bellows 01/02/2021, 6:28 PM

## 2021-01-02 NOTE — Progress Notes (Signed)
Inpatient Rehab Admissions Coordinator:   Met with patient and her sister  at bedside to discuss potential CIR admission. They are interested in CIR admission and sister plans to provide 24//7 support at d/c.    I will follow for potential admit pending bed availability and medical readiness.    Laura Staley, MS, CCC-SLP Rehab Admissions Coordinator  [phone] (celll) [phone] (office)   

## 2021-01-02 NOTE — Progress Notes (Signed)
Inpatient Rehab Admissions Coordinator Note:   Per therapy recommendations, pt was screened for CIR candidacy by Heavan Francom, MS CCC-SLP. At this time, Pt. Appears to have functional decline and is a good candidate for CIR. Will request order for rehab consult per protocol.  Please contact me with questions.   Marilynne Dupuis, MS, CCC-SLP Rehab Admissions Coordinator  [phone] (celll) [phone] (office)  

## 2021-01-02 NOTE — Plan of Care (Signed)

## 2021-01-02 NOTE — Progress Notes (Signed)
Pt tearful throughout night. Pt visibly frustrated at inability to communicate (expressive aphasia/ phonemic paraphasias).

## 2021-01-02 NOTE — Progress Notes (Signed)
   Providing Compassionate, Quality Care - Together  NEUROSURGERY PROGRESS NOTE   S: No issues overnight. Complains of incisional pain, word finding difficulty  O: EXAM:  BP (!) 169/88 (BP Location: Right Arm)   Pulse 63   Temp 98.1 F (36.7 C) (Oral)   Resp 14   Ht 5\' 2"  (1.575 m)   Wt 66.7 kg   SpO2 97%   BMI 26.90 kg/m   Awake, alert, oriented  NAD Dressing intact Speech difficulty, mainly word finding R facial droop  5/5 LU/LLE RUE 0/5, RLE 3/5   ASSESSMENT:  70 y.o. female with  1. Metastatic uterine CA, s/p crani for resection  PLAN: - pt/ot  - pain control - tolerating diet - decadron taper - dvt ppx    Thank you for allowing me to participate in this patient's care.  Please do not hesitate to call with questions or concerns.   Elwin Sleight, [facility] Cell: [phone]

## 2021-01-03 LAB — GLUCOSE, CAPILLARY
Glucose-Capillary: 150 mg/dL — ABNORMAL HIGH (ref 70–99)
Glucose-Capillary: 193 mg/dL — ABNORMAL HIGH (ref 70–99)
Glucose-Capillary: 187 mg/dL — ABNORMAL HIGH (ref 70–99)
Glucose-Capillary: 166 mg/dL — ABNORMAL HIGH (ref 70–99)

## 2021-01-03 NOTE — Progress Notes (Signed)
Resident made aware 3 times today of worsening swelling on R hand. Pt has bruising at site that is blue and purple, with a hard knot on the dorsal side of hand. Warm to the touch and red on the knot. Pt has elevated and iced with no improvements. MD made aware and said he has had her 3 days and it has not changed. Not concerned but stated to keep ice on and elevate. Also give pain medicine. Pt was given tyelnol and c/o pain on hand.   Pt is able to wiggle fingers and has cap refill.

## 2021-01-03 NOTE — Progress Notes (Signed)
   Providing Compassionate, Quality Care - Together  NEUROSURGERY PROGRESS NOTE   S: No issues overnight.  Continues word finding difficulty  O: EXAM:  BP (!) 160/80 (BP Location: Right Arm)   Pulse 87   Temp 97.8 F (36.6 C)   Resp 16   Ht 5\' 2"  (1.575 m)   Wt 66.7 kg   SpO2 99%   BMI 26.90 kg/m   Awake, alert, oriented to name NAD Dressing intact Word finding difficulty, remains stable R facial droop  5/5 LU/LLE RUE 0/5, RLE 1-2/5, there is some right-sided neglect  ASSESSMENT:  70 y.o. female with  1. Metastatic uterine CA, s/p crani for resection  PLAN: - pt/ot  - pain control - tolerating diet - decadron taper - dvt ppx -Rehab pending, candidate for CIR   Thank you for allowing me to participate in this patient's care.  Please do not hesitate to call with questions or concerns.   Elwin Sleight, [facility] Cell: [phone]

## 2021-01-03 NOTE — Consult Note (Signed)
Physical Medicine and Rehabilitation Consult Reason for Consult: Progressive right side weakness and slurred speech Referring Physician: Dr. Duffy Rhody   HPI: Sandra Arroyo is a 70 y.o. 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 Alvy Bimler with oncology services as well as history of diabetes mellitus, hypertension and hyperparathyroidism.  Per chart review patient lives with spouse.  Independent prior to admission.  1 level home.  She has been staying with her sister at times during her cancer treatments.  She has excellent family support.  Presented  01/17/2021 with progressive right-sided weakness and slurred speech.  She was found to have metastatic CNS disease including 1 small left frontal and right frontal metastasis and a large left parietal metastasis measuring 4.6 cm with significant vasogenic edema.  Patient underwent left craniotomy for resection of tumor 01/19/2021 per Dr. Duffy Rhody.  MRI of the brain showed no unexpected findings after left frontal parietal metastasis resection smaller bifrontal metastasis unchanged.  Plan follow-up Dr. Mickeal Skinner.  Currently maintained on Decadron therapy.  Subcutaneous Lovenox for DVT prophylaxis initiated 12/31/2020.  Tolerating a regular consistency diet.  Therapy evaluations completed due to patient's right side weakness recommendations of physical medicine rehab consult.   Review of Systems  Constitutional: Negative for chills and fever.  HENT: Negative for hearing loss.   Eyes: Negative for blurred vision and double vision.  Respiratory: Negative for cough and shortness of breath.   Cardiovascular: Negative for chest pain, palpitations and leg swelling.  Gastrointestinal: Positive for constipation. Negative for heartburn, nausea and vomiting.  Genitourinary: Negative for dysuria, flank pain and hematuria.  Musculoskeletal: Positive for  joint pain and myalgias.  Skin: Negative for rash.  Neurological: Positive for speech change, weakness and headaches.  All other systems reviewed and are negative.  Past Medical History:  Diagnosis Date  . Cancer ([place]) 2019  . Diabetes mellitus without complication ([place])   . Family history of adverse reaction to anesthesia   . Hyperparathyroidism, unspecified ([place])   . Hypertension   . PONV (postoperative nausea and vomiting)    Past Surgical History:  Procedure Laterality Date  . ABDOMINAL HYSTERECTOMY    . APPLICATION OF CRANIAL NAVIGATION N/A 01/05/2021   Procedure: APPLICATION OF CRANIAL NAVIGATION;  Surgeon: Vallarie Mare, MD;  Location: [place];  Service: Neurosurgery;  Laterality: N/A;  . CESAREAN SECTION    . CHOLECYSTECTOMY    . CRANIOTOMY Left 01/05/2021   Procedure: LEFT CRANIOTOMY FOR RESECTON OF TUMOR WITH BRAIN LAB;  Surgeon: Vallarie Mare, MD;  Location: [place];  Service: Neurosurgery;  Laterality: Left;  . PARATHYROIDECTOMY    . TONSILLECTOMY     Family History  Problem Relation Age of Onset  . Cancer Mother 40       breast ca  . Cancer Father        testicular, kidney cancer, liver  . Stroke Father   . Diabetes Father   . Cancer Brother        testicular cancer   Social History:  reports that she has never smoked. She has never used smokeless tobacco. She reports that she does not drink alcohol and does not use drugs. Allergies:  Allergies  Allergen Reactions  . Lisinopril Swelling    Face swelling Face swelling   . Verapamil Rash  . Other Rash    ADHESIVES ADHESIVES ADHESIVES ADHESIVES    Medications Prior to Admission  Medication Sig  Dispense Refill  . amLODipine (NORVASC) 10 MG tablet Take 1 tablet (10 mg total) by mouth daily. 90 tablet 3  . clonazePAM (KLONOPIN) 0.5 MG tablet Take 1 tablet (0.5 mg total) by mouth at bedtime as needed (sleep). 30 tablet 3  . dexamethasone (DECADRON) 4 MG tablet Take 1 tablet (4 mg total) by mouth 3  (three) times daily. 90 tablet 1  . famotidine (PEPCID) 20 MG tablet Take 1 tablet (20 mg total) by mouth 2 (two) times daily. 60 tablet 0  . hydrALAZINE (APRESOLINE) 25 MG tablet Take 1 tablet (25 mg total) by mouth 3 (three) times daily. 270 tablet 3  . HYDROcodone-acetaminophen (NORCO/VICODIN) 5-325 MG tablet Take 1 tablet by mouth every 6 (six) hours as needed. (Patient taking differently: Take 1 tablet by mouth every 6 (six) hours as needed for moderate pain.) 60 tablet 0  . insulin aspart (NOVOLOG) 100 UNIT/ML FlexPen CBG 70 - 120: 0 units  CBG 121 - 150: 2 units  CBG 151 - 200: 3 units  CBG 201 - 250: 5 units  CBG 251 - 300: 8 units  CBG 301 - 350: 11 units  CBG 351 - 400: 15 units 15 mL 0  . levETIRAcetam (KEPPRA) 500 MG tablet Take 1 tablet (500 mg total) by mouth 2 (two) times daily. 60 tablet 0  . levothyroxine (SYNTHROID) 25 MCG tablet Take 1 tablet (25 mcg total) by mouth daily before breakfast. 30 tablet 11  . lidocaine-prilocaine (EMLA) cream Apply to affected area once 30 g 3  . losartan (COZAAR) 100 MG tablet Take 100 mg by mouth daily.    . metoprolol succinate (TOPROL-XL) 100 MG 24 hr tablet Take 1 tablet (100 mg total) by mouth daily. Take with or immediately following a meal. 30 tablet 1  . pantoprazole (PROTONIX) 20 MG tablet Take 1 tablet (20 mg total) by mouth daily. 30 tablet 0  . rosuvastatin (CRESTOR) 10 MG tablet Take 1 tablet (10 mg total) by mouth daily. 90 tablet 2  . terazosin (HYTRIN) 2 MG capsule Take 1 capsule (2 mg total) by mouth at bedtime. 30 capsule 1  . blood glucose meter kit and supplies Dispense based on patient and insurance preference. Use up to four times daily as directed. (FOR ICD-10 E10.9, E11.9). 1 each 0  . Insulin Pen Needle (PEN NEEDLES 3/16") 31G X 5 MM MISC Use as directed with insulin pen 100 each 0  . ondansetron (ZOFRAN) 8 MG tablet Take 1 tablet (8 mg total) by mouth every 8 (eight) hours as needed (Nausea or vomiting). 30 tablet 1  .  prochlorperazine (COMPAZINE) 10 MG tablet Take 1 tablet (10 mg total) by mouth every 6 (six) hours as needed (Nausea or vomiting). 30 tablet 1    Home: Home Living Family/patient expects to be discharged to:: Private residence Living Arrangements: Spouse/significant other,Other relatives (staying with sister in [place] but lives in a different home with spouse. only stays with sister during CA treatments) Available Help at Discharge: Family,Available 24 hours/day Type of Home: House Home Access: Level entry Entrance Stairs-Number of Steps: 0 Entrance Stairs-Rails: Left,Right Home Layout: One level Bathroom Shower/Tub: Chiropodist: Standard Home Equipment: None Additional Comments: information is the sister home. Regular 4 stairs at home to main level and tub shower combo  Functional History: Prior Function Level of Independence: Independent Functional Status:  Mobility: Bed Mobility Overal bed mobility: Needs Assistance Bed Mobility: Supine to Sit,Sit to Supine Supine to sit: Mod assist,+2  for physical assistance Sit to supine: Mod assist,+2 for physical assistance General bed mobility comments: pt requries cues for LE movement and modA to BLE to complete movement. modA to facilitate pivot with use of pad, and modA to mobilize trunk from [place] Overall transfer level: Needs assistance Equipment used: 2 person hand held assist Transfers: Sit to/from Stand Sit to Stand: Max assist,+2 physical assistance Stand pivot transfers: +2 physical assistance,Max assist General transfer comment: maxA of 2 to complete sit-stand trom EOB x2. cues and facilitation to improve posture and upright instanding. limited to 3-5 seconds standing due to fatigue Ambulation/Gait General Gait Details: unable at this time    ADL: ADL Overall ADL's : Needs assistance/impaired Eating/Feeding: Moderate assistance,Bed level Grooming: Moderate assistance,Bed level Lower Body  Dressing: Maximal assistance Lower Body Dressing Details (indicate cue type and reason): don socks able to lift leg to help  Cognition: Cognition Overall Cognitive Status: Difficult to assess Orientation Level: Oriented to person,Oriented to time,Oriented to place Cognition Arousal/Alertness: Awake/alert Behavior During Therapy: WFL for tasks assessed/performed Overall Cognitive Status: Difficult to assess General Comments: pt accurately answering yes/no questions, but with increased frustration due to speech difficulties today Difficult to assess due to: Impaired communication  Blood pressure (!) 156/87, pulse 68, temperature 97.6 F (36.4 C), temperature source Oral, resp. rate (!) 25, height _0  (1.575 m), weight 66.7 kg, SpO2 94 %. Physical Exam HENT:     Head:     Comments: Craniotomy site dressed. Cardiovascular:     Rate and Rhythm: Normal rate.     Pulses: Normal pulses.  Abdominal:     Palpations: Abdomen is soft.  Musculoskeletal:        General: Swelling (RUE) present.  Neurological:     Comments: Patient is a bit drowsy but arousable.  Makes eye contact with examiner.  Some delay in processing but provides her name and age.  Follows simple commands. Dense R HP but moves left side freely. Right central 7     Results for orders placed or performed during the hospital encounter of 01/17/2021 (from the past 24 hour(s))  Glucose, capillary     Status: Abnormal   Collection Time: 01/03/21  7:46 AM  Result Value Ref Range   Glucose-Capillary 150 (H) 70 - 99 mg/dL   Comment 1 Notify RN    Comment 2 Document in Chart   Glucose, capillary     Status: Abnormal   Collection Time: 01/03/21 12:41 PM  Result Value Ref Range   Glucose-Capillary 193 (H) 70 - 99 mg/dL  Glucose, capillary     Status: Abnormal   Collection Time: 01/03/21  4:48 PM  Result Value Ref Range   Glucose-Capillary 187 (H) 70 - 99 mg/dL  Glucose, capillary     Status: Abnormal   Collection Time:  01/03/21  9:01 PM  Result Value Ref Range   Glucose-Capillary 166 (H) 70 - 99 mg/dL   No results found.   Assessment/Plan: Diagnosis: metastatic endometrial cancer to left frontal/parietal brain s/p tumor resection 01/01/2021 1. Does the need for close, 24 hr/day medical supervision in concert with the patient's rehab needs make it unreasonable for this patient to be served in a less intensive setting? Yes 2. Co-Morbidities requiring supervision/potential complications: previous lung involvement, dm, htn, hyperparathyroid 3. Due to bladder management, bowel management, safety, skin/wound care, disease management, medication administration, pain management and patient education, does the patient require 24 hr/day rehab nursing? Yes 4. Does the patient require coordinated care of  a physician, rehab nurse, therapy disciplines of PT, OT, SLP to address physical and functional deficits in the context of the above medical diagnosis(es)? Yes Addressing deficits in the following areas: balance, endurance, locomotion, strength, transferring, bowel/bladder control, bathing, dressing, feeding, grooming, toileting, cognition, speech, swallowing and psychosocial support 5. Can the patient actively participate in an intensive therapy program of at least 3 hrs of therapy per day at least 5 days per week? Yes 6. The potential for patient to make measurable gains while on inpatient rehab is excellent 7. Anticipated functional outcomes upon discharge from inpatient rehab are supervision and min assist  with PT, supervision and min assist with OT, supervision with SLP. 8. Estimated rehab length of stay to reach the above functional goals is: 22-28 days 9. Anticipated discharge destination: Home 10. Overall Rehab/Functional Prognosis: good  RECOMMENDATIONS: This patient's condition is appropriate for continued rehabilitative care in the following setting: CIR Patient has agreed to participate in recommended program.  Potentially Note that insurance prior authorization may be required for reimbursement for recommended care.  Comment: Rehab Admissions Coordinator to follow up.  Thanks,  Meredith Staggers, MD, FAAPMR  I have examined pertinent labs and radiographic images. I have reviewed and concur with the physician assistant's documentation above.    Lavon Paganini Angiulli, PA-C 01/04/2021

## 2021-01-03 NOTE — PMR Pre-admission (Shared)
PMR Admission Coordinator Pre-Admission Assessment  Patient: Sandra Arroyo is an 70 y.o., female MRN: [id number] DOB: 09-30-1951 Height: 5\' 2"  (157.5 cm) Weight: 66.7 kg              Insurance Information HMO: ***    PPO: ***     PCP: ***     IPA: ***     80/20: ***     OTHER: *** PRIMARY: Medicare Part A and B     Policy#: [id number]      CM Name:       Phone#:      Fax#:  Pre-Cert#: verified Civil engineer, contracting:  Benefits:  Phone #:      Name:  Eff. Date: 03/28/2014  A and B     Deduct: $1566      Out of Pocket Max: n/a      Life Max: n/a CIR: 100%      SNF: 20 full days Outpatient: 80%     Co-Pay: 20% Home Health: 100%      Co-Pay:  DME: 80%     Co-Pay: 20% Providers: pt choice  SECONDARY: Generic Commercial      Policy#: [id number]        Financial Counselor: n/a     Phone#:   The "Data Collection Information Summary" for patients in Inpatient Rehabilitation Facilities with attached "Privacy Act [place] Records" was provided and verbally reviewed with: Patient and Family  Emergency Contact Information Contact Information    Name Relation Home Work [place] Sister   [phone]   Sandra Arroyo   [phone]     Current Medical History  Patient Admitting Diagnosis: Brain Tumor History of Present Illness: Patient is a 70 y.o. female with uterine cancer developed progressive right sided weakness and speech difficulties.  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  After interdisciplinary discussion, preop SRS followed by surgery was recommended.  She was placed on steroids and yesterday underwent SRS.  She reports worsening of her right isded weakness over the past several days. Pt. Underwent L craniotomy and resection of tumor on 01/21/2021. CIR was consulted to assist Pt in returning to PLOF.   Glasgow Coma Scale Score: 14  Past Medical History  Past Medical History:  Diagnosis Date  .  Cancer ([place]) 2019  . Diabetes mellitus without complication ([place])   . Family history of adverse reaction to anesthesia   . Hyperparathyroidism, unspecified ([place])   . Hypertension   . PONV (postoperative nausea and vomiting)     Family History  family history includes Cancer in her brother and father; Cancer (age of onset: 69) in her mother; Diabetes in her father; Stroke in her father.  Prior Rehab/Hospitalizations:  Has the patient had prior rehab or hospitalizations prior to admission? Yes  Has the patient had major surgery during 100 days prior to admission? Yes  Current Medications   Current Facility-Administered Medications:  .  0.9 %  sodium chloride infusion, , Intravenous, PRN, Vallarie Mare, MD, Stopped at 12/31/20 0422 .  0.9 % NaCl with KCl 20 mEq/ L  infusion, , Intravenous, Continuous, Vallarie Mare, MD, Last Rate: 75 mL/hr at 01/04/21 1516, New Bag at 01/04/21 1516 .  acetaminophen (TYLENOL) tablet 650 mg, 650 mg, Oral, Q4H PRN, 650 mg at 01/02/21 1333 **OR** acetaminophen (TYLENOL) suppository 650 mg, 650 mg, Rectal, Q4H PRN, [name],  Dorcas Carrow, MD .  amLODipine (NORVASC) tablet 10 mg, 10 mg, Oral, Daily, Vallarie Mare, MD, 10 mg at 01/05/21 0957 .  Chlorhexidine Gluconate Cloth 2 % PADS 6 each, 6 each, Topical, Daily, Vallarie Mare, MD, 6 each at 01/04/21 [phone] .  clonazePAM (KLONOPIN) tablet 0.5 mg, 0.5 mg, Oral, QHS PRN, Vallarie Mare, MD, 0.5 mg at 01/02/21 2059 .  dexamethasone (DECADRON) tablet 4 mg, 4 mg, Oral, Q8H, 4 mg at 01/05/21 0530 **FOLLOWED BY** [START ON 01/07/2021] dexamethasone (DECADRON) tablet 4 mg, 4 mg, Oral, Q12H **FOLLOWED BY** [START ON 01-30-2021] dexamethasone (DECADRON) tablet 2 mg, 2 mg, Oral, Q12H **FOLLOWED BY** [START ON 01/11/2021] dexamethasone (DECADRON) tablet 1 mg, 1 mg, Oral, Q12H **FOLLOWED BY** [START ON 01/14/2021] dexamethasone (DECADRON) tablet 1 mg, 1 mg, Oral, Daily, Vallarie Mare, MD .  docusate sodium  (COLACE) capsule 100 mg, 100 mg, Oral, BID, Vallarie Mare, MD, 100 mg at 01/05/21 0957 .  enoxaparin (LOVENOX) injection 40 mg, 40 mg, Subcutaneous, Q24H, Vallarie Mare, MD, 40 mg at 01/05/21 1200 .  famotidine (PEPCID) tablet 20 mg, 20 mg, Oral, BID, Vallarie Mare, MD, 20 mg at 01/05/21 0957 .  feeding supplement (ENSURE ENLIVE / ENSURE PLUS) liquid 237 mL, 237 mL, Oral, BID BM, Vallarie Mare, MD, 237 mL at 01/04/21 1312 .  hydrALAZINE (APRESOLINE) tablet 25 mg, 25 mg, Oral, TID, Vallarie Mare, MD, 25 mg at 01/05/21 0957 .  HYDROcodone-acetaminophen (NORCO/VICODIN) 5-325 MG per tablet 1 tablet, 1 tablet, Oral, Q6H PRN, Vallarie Mare, MD, 1 tablet at 01/05/21 0531 .  insulin aspart (novoLOG) injection 0-15 Units, 0-15 Units, Subcutaneous, TID PC, Vallarie Mare, MD, 2 Units at 01/05/21 0957 .  labetalol (NORMODYNE) injection 10 mg, 10 mg, Intravenous, Q10 min PRN, Vallarie Mare, MD .  labetalol (NORMODYNE) injection 10-20 mg, 10-20 mg, Intravenous, Q2H PRN, Vallarie Mare, MD .  levETIRAcetam (KEPPRA) tablet 500 mg, 500 mg, Oral, BID, Vallarie Mare, MD, 500 mg at 01/05/21 0957 .  levothyroxine (SYNTHROID) tablet 25 mcg, 25 mcg, Oral, QAC breakfast, Vallarie Mare, MD, 25 mcg at 01/05/21 0530 .  losartan (COZAAR) tablet 100 mg, 100 mg, Oral, Daily, Vallarie Mare, MD, 100 mg at 01/05/21 0957 .  methocarbamol (ROBAXIN) tablet 500 mg, 500 mg, Oral, Q6H PRN, Costella, Vincent J, PA-C, 500 mg at 01/04/21 2031 .  metoprolol succinate (TOPROL-XL) 24 hr tablet 100 mg, 100 mg, Oral, Daily, Vallarie Mare, MD, 100 mg at 01/05/21 0957 .  morphine 2 MG/ML injection 1-2 mg, 1-2 mg, Intravenous, Q2H PRN, Vallarie Mare, MD, 2 mg at 01/05/21 1200 .  [DISCONTINUED] ondansetron (ZOFRAN) tablet 4 mg, 4 mg, Oral, Q4H PRN **OR** ondansetron (ZOFRAN) injection 4 mg, 4 mg, Intravenous, Q4H PRN, Vallarie Mare, MD .  ondansetron [facility]) tablet 8 mg, 8 mg,  Oral, Q8H PRN, Vallarie Mare, MD .  pantoprazole (PROTONIX) EC tablet 20 mg, 20 mg, Oral, Daily, Vallarie Mare, MD, 20 mg at 01/05/21 0957 .  polyethylene glycol (MIRALAX / GLYCOLAX) packet 17 g, 17 g, Oral, Daily PRN, Vallarie Mare, MD .  prochlorperazine (COMPAZINE) tablet 10 mg, 10 mg, Oral, Q6H PRN, Vallarie Mare, MD .  promethazine (PHENERGAN) tablet 12.5-25 mg, 12.5-25 mg, Oral, Q4H PRN, Vallarie Mare, MD .  rosuvastatin (CRESTOR) tablet 10 mg, 10 mg, Oral, Daily, Vallarie Mare, MD, 10 mg at 01/05/21 0957 .  sodium phosphate (FLEET) 7-19  GM/118ML enema 1 enema, 1 enema, Rectal, Once PRN, Vallarie Mare, MD .  terazosin (HYTRIN) capsule 2 mg, 2 mg, Oral, QHS, Vallarie Mare, MD, 2 mg at 01/04/21 2030  Patients Current Diet:  Diet Order            Diet regular Room service appropriate? Yes; Fluid consistency: Thin  Diet effective now                 Precautions / Restrictions Precautions Precautions: Fall Precaution Comments: R hemiplegia, aphasia Restrictions Weight Bearing Restrictions: No   Has the patient had 2 or more falls or a fall with injury in the past year?No  Prior Activity Level Community (5-7x/wk): Active in the community PTA  Prior Functional Level Prior Function Level of Independence: Independent  Self Care: Did the patient need help bathing, dressing, using the toilet or eating?  Independent  Indoor Mobility: Did the patient need assistance with walking from room to room (with or without device)? Independent  Stairs: Did the patient need assistance with internal or external stairs (with or without device)? Independent  Functional Cognition: Did the patient need help planning regular tasks such as shopping or remembering to take medications? Independent  Home Assistive Devices / Equipment Home Equipment: None  Prior Device Use: Indicate devices/aids used by the patient prior to current illness, exacerbation or  injury? None of the above  Current Functional Level Cognition  Overall Cognitive Status: Difficult to assess Difficult to assess due to: Impaired communication Current Attention Level: Selective Orientation Level: Oriented to person,Oriented to place,Other (comment) (to yes/no questions) Following Commands: Follows one step commands inconsistently,Follows one step commands with increased time Safety/Judgement: Decreased awareness of safety,Decreased awareness of deficits General Comments: pt able to answer some yes/no questions consistently, but was unable to follow commands regarding mobility of either extremity (L or R side) during session. definite R-sided neglect, with possible visual field cut. pt able to bring eyes to midline, but with decreased attention and unable to maintain    Extremity Assessment (includes Sensation/Coordination)  Upper Extremity Assessment: Defer to OT evaluation RUE Deficits / Details: flaccid with tone present. Will likely need soft pillow splint and resting hand splint RUE Sensation: decreased proprioception RUE Coordination: decreased fine motor,decreased gross motor  Lower Extremity Assessment: Generalized weakness,RLE deficits/detail RLE Deficits / Details: tone present at R knee extensors and hip flexors, able to achieve full PROM to ankle. able to activate R knee extensors, but unable to achieve heel slide or SLR without assist RLE Sensation: decreased proprioception RLE Coordination: decreased fine motor,decreased gross motor    ADLs  Overall ADL's : Needs assistance/impaired Eating/Feeding: Moderate assistance,Bed level Grooming: Oral care,Bed level,Supervision/safety,Set up,Wash/dry face Grooming Details (indicate cue type and reason): from sitting up in bed, pt required set- up of toothbrush placed in pts L hand and hand under hand assist to apply paste to brush with brush in RUE ( hand over hand grasp to maintain positioning inR hand) however pt  able to brush teeth with overall set- up- supervision assist with LUE.pt also able to wash face with LUE when wash cloth places in pts L hand Lower Body Dressing: Maximal assistance Lower Body Dressing Details (indicate cue type and reason): don socks able to lift leg to help Toilet Transfer Details (indicate cue type and reason): pt declined General ADL Comments: pt admanantly stating "no" offered OOB mobility with pt becoming tearful, therefor session focus on BADL reeducation from bed level and bilateral integration tasks  Mobility  Overal bed mobility: Needs Assistance Bed Mobility: Supine to Sit,Sit to Supine Supine to sit: Mod assist,+2 for physical assistance Sit to supine: Mod assist,+2 for physical assistance General bed mobility comments: deferred bed mobility as pt adamantly stating "no" when asked    Transfers  Overall transfer level: Needs assistance Equipment used: 2 person hand held assist Transfers: Sit to/from Stand Sit to Stand: Max assist,+2 physical assistance Stand pivot transfers: +2 physical assistance,Max assist General transfer comment: deferred transfer training as pt adamantly stating "no" when asked    Ambulation / Gait / Stairs / Wheelchair Mobility  Ambulation/Gait General Gait Details: unable at this time    Posture / Balance Dynamic Sitting Balance Sitting balance - Comments: pt with strong R lateral lean and pushing from L, worked on propping on L elbow, but requires max facilitation to achieve due to pt with poor motor planning and coordination, leaning forwards when asked to reach with LUE resulting in need for maxA to maintain sitting Eob Balance Overall balance assessment: Needs assistance Sitting-balance support: Single extremity supported,Feet supported Sitting balance-Leahy Scale: Zero Sitting balance - Comments: pt with strong R lateral lean and pushing from L, worked on propping on L elbow, but requires max facilitation to achieve due to pt  with poor motor planning and coordination, leaning forwards when asked to reach with LUE resulting in need for maxA to maintain sitting Eob Postural control: Right lateral lean Standing balance support: Bilateral upper extremity supported Standing balance-Leahy Scale: Poor Standing balance comment: maxA of 2    Special needs/care consideration [facility]  and Designated visitor [place] (from acute therapy documentation) Living Arrangements: Spouse/significant other,Other relatives (staying with sister in [place] but lives in a different home with spouse. only stays with sister during CA treatments) Available Help at Discharge: Family,Available 24 hours/day Type of Home: House Home Layout: One level Home Access: Level entry Entrance Stairs-Rails: Left,Right Entrance Stairs-Number of Steps: 0 Bathroom Shower/Tub: Chiropodist: Standard Additional Comments: information is the sister home. Regular 4 stairs at home to main level and tub shower combo  Discharge Living Setting Plans for Discharge Living Setting: House Type of Home at Discharge: House Discharge Home Layout: One level Discharge Home Access: Level entry Discharge Bathroom Shower/Tub: Tub/shower unit Discharge Bathroom Toilet: Standard Discharge Bathroom Accessibility: Yes How Accessible: Accessible via walker Does the patient have any problems obtaining your medications?: No  Social/Family/Support Systems Patient Roles: Other (Comment) Contact Information: [phone] Anticipated Caregiver: [name] (sister) Anticipated Caregiver's Contact Information: [phone] Ability/Limitations of Caregiver: Can provide min A Caregiver Availability: 24/7 Discharge Plan Discussed with Primary Caregiver: Yes Is Caregiver In Agreement with Plan?: Yes Does Caregiver/Family have Issues with Lodging/Transportation while Pt is in Rehab?:  No   Goals Patient/Family Goal for Rehab: PT/OT/SLP Min A Expected length of stay: 22-28 days Pt/Family Agrees to Admission and willing to participate: Yes Program Orientation Provided & Reviewed with Pt/Caregiver Including Roles  & Responsibilities: Yes   Decrease burden of Care through IP rehab admission: Specialzed equipment needs, Decrease number of caregivers, Bowel and bladder program and Patient/family education   Possible need for SNF placement upon discharge: not anticipated   Patient Condition: {PATIENT'S CONDITION:22832}  Preadmission Screen Completed By:  Genella Mech, CCC-SLP, 01/05/2021 1:53 PM ______________________________________________________________________   Discussed status with Dr. Marland Kitchenon***at *** and received approval for admission today.  Admission Coordinator:  Genella Mech, time***/Date***

## 2021-01-04 ENCOUNTER — Inpatient Hospital Stay: Payer: Medicare Other | Attending: Hematology and Oncology

## 2021-01-04 LAB — GLUCOSE, CAPILLARY
Glucose-Capillary: 150 mg/dL — ABNORMAL HIGH (ref 70–99)
Glucose-Capillary: 134 mg/dL — ABNORMAL HIGH (ref 70–99)
Glucose-Capillary: 145 mg/dL — ABNORMAL HIGH (ref 70–99)
Glucose-Capillary: 173 mg/dL — ABNORMAL HIGH (ref 70–99)

## 2021-01-04 MED ORDER — DEXAMETHASONE 4 MG PO TABS: 2.0000 mg | ORAL_TABLET | Freq: Two times a day (BID) | ORAL | Status: DC

## 2021-01-04 MED ORDER — DEXAMETHASONE 0.5 MG PO TABS: 1.0000 mg | ORAL_TABLET | Freq: Every day | ORAL | Status: DC

## 2021-01-04 MED ORDER — DEXAMETHASONE 0.5 MG PO TABS: 1.0000 mg | ORAL_TABLET | Freq: Two times a day (BID) | ORAL | Status: DC

## 2021-01-04 MED ORDER — DEXAMETHASONE 4 MG PO TABS
4.0000 mg | ORAL_TABLET | Freq: Three times a day (TID) | ORAL | Status: DC
  Administered 2021-01-04 – 2021-01-07 (×8): 4 mg via ORAL
  Filled 2021-01-04 (×9): qty 1

## 2021-01-04 MED ORDER — DEXAMETHASONE 4 MG PO TABS
4.0000 mg | ORAL_TABLET | Freq: Two times a day (BID) | ORAL | Status: DC
  Administered 2021-01-07 – 2021-01-08 (×2): 4 mg via ORAL
  Filled 2021-01-04 (×2): qty 1

## 2021-01-04 NOTE — Care Management Important Message (Signed)
Important Message  Patient Details  Name: Sandra Arroyo MRN: [id number] Date of Birth: 1951-04-20   Medicare Important Message Given:  Yes     Orbie Pyo 01/04/2021, 3:28 PM

## 2021-01-04 NOTE — Progress Notes (Signed)
Occupational Therapy Treatment Patient Details Name: Sandra Arroyo MRN: [id number] DOB: January 03, 1951 Today's Date: 01/04/2021    History of present illness 70 year old female presenting s/p left frontoparietal craniotomy for resection of tumor on 2/2 to correct itracranial metastases with some mass-effect that had been identified 12/23/2020. 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   OT comments  Session dovetailed with PT. Pt continues to present with increased pain, decreased activity tolerance, generalized deconditioning, R sided weakness and visual deficits impacting pts ability to complete BADLs independently. Pt adamantly stating "no" when asking pt about OOB mobility therefore session focus on BADL reeducation. Pt able to complete UB grooming tasks with HOB elevated with set- up assist of toothbrush placed in pts L hand and toothpaste applied to brush as pt with trouble sequencing applying paste to brush, also feel visual deficits may be attributing to issues as pt seems to be unable to attend to problem solving tasks not positioned in pts L visual field. Reached out to MD concerning placing an order for R resting hand splint per OTR previous recommendations, will await approval and return to fit and complete skin check for splint. Plan to additionally apply R elbow splint d/t increased tone in RUE, will assess more next session for fit and skin check of splint. Pt would continue to benefit from skilled occupational therapy while admitted and after d/c to address the below listed limitations in order to improve overall functional mobility and facilitate independence with BADL participation. DC plan remains appropriate, will follow acutely per POC.    Follow Up Recommendations  CIR    Equipment Recommendations  Wheelchair (measurements OT);Wheelchair cushion (measurements OT);3 in 1 bedside  commode    Recommendations for Other Services      Precautions / Restrictions Precautions Precautions: Fall Precaution Comments: R hemiplegia, aphasia Restrictions Weight Bearing Restrictions: No       Mobility Bed Mobility               General bed mobility comments: deferred bed mobility as pt adamantly stating "no" when asked  Transfers                 General transfer comment: deferred transfer training as pt adamantly stating "no" when asked    Balance                                           ADL either performed or assessed with clinical judgement   ADL Overall ADL's : Needs assistance/impaired     Grooming: Oral care;Bed level;Supervision/safety;Set up;Wash/dry face Grooming Details (indicate cue type and reason): from sitting up in bed, pt required set- up of toothbrush placed in pts L hand and hand under hand assist to apply paste to brush with brush in RUE ( hand over hand grasp to maintain positioning inR hand) however pt able to brush teeth with overall set- up- supervision assist with LUE.pt also able to wash face with LUE when wash cloth places in pts L hand                   Toilet Transfer Details (indicate cue type and reason): pt declined           General ADL Comments: pt admanantly stating "no" offered OOB mobility with pt becoming tearful,  therefor session focus on BADL reeducation from bed level and bilateral integration tasks     Vision Baseline Vision/History: Wears glasses Wears Glasses: Reading only Vision Assessment?: Vision impaired- to be further tested in functional context Additional Comments: pt with L gaze preference but will attend to R side with MAX multimodal cues, question field cut as pt not responding to threat on R side and present with difficulty sustaining attention to ADL when items are shifted to R side   Perception     Praxis      Cognition Arousal/Alertness:  Awake/alert Behavior During Therapy: WFL for tasks assessed/performed (emotional/ frustrated at times) Overall Cognitive Status: Difficult to assess                                 General Comments: pt stating yes and no appropriately with increased time. able to accurately answer questions such as"is your brother here" with increased time. pt initally states yes to "is your brother here" but then is able to self correct with increased time.        Exercises Other Exercises Other Exercises: attempted hand over hand shoulder horizontal ABD/ADD with BUEs positioned in front of pt on bed side table with wash cloth underneath pts hands ( to decrease friction) however pt unable to complete without assist.   Shoulder Instructions       General Comments      Pertinent Vitals/ Pain       Pain Assessment: Faces Faces Pain Scale: Hurts little more Pain Location: generalized grimace with mobility Pain Descriptors / Indicators: Grimacing Pain Intervention(s): Limited activity within patient's tolerance;Monitored during session;Repositioned  Home Living                                          Prior Functioning/Environment              Frequency  Min 2X/week        Progress Toward Goals  OT Goals(current goals can now be found in the care plan section)  Progress towards OT goals: Progressing toward goals  Acute Rehab OT Goals Patient Stated Goal: none stated at this time but expressed "feel better after moving" OT Goal Formulation: With patient/family Time For Goal Achievement: 01/15/21 Potential to Achieve Goals: Good  Plan Discharge plan remains appropriate;Frequency remains appropriate    Co-evaluation                 AM-PAC OT "6 Clicks" Daily Activity     Outcome Measure   Help from another person eating meals?: A Lot Help from another person taking care of personal grooming?: A Lot Help from another person toileting, which  includes using toliet, bedpan, or urinal?: Total Help from another person bathing (including washing, rinsing, drying)?: A Lot Help from another person to put on and taking off regular upper body clothing?: A Lot Help from another person to put on and taking off regular lower body clothing?: Total 6 Click Score: 10    End of Session    OT Visit Diagnosis: Unsteadiness on feet (R26.81);Muscle weakness (generalized) (M62.81);Hemiplegia and hemiparesis Hemiplegia - Right/Left: Right Hemiplegia - dominant/non-dominant: Non-Dominant Hemiplegia - caused by: Unspecified   Activity Tolerance Patient limited by pain;Patient limited by lethargy   Patient Left in bed;with call bell/phone within reach;with bed alarm set  Nurse Communication Mobility status        Time: 1247-1300 OT Time Calculation (min): 13 min  Charges: OT General Charges $OT Visit: 1 Visit OT Treatments $Self Care/Home Management : 8-22 mins  Harley Alto., COTA/L Acute Rehabilitation Services [place] 01/04/2021, 1:40 PM

## 2021-01-04 NOTE — Progress Notes (Signed)
Subjective: Patient reports no headache, though limited communication  Objective: Vital signs in last 24 hours: Temp:  [97.6 F (36.4 C)-98.4 F (36.9 C)] 98.2 F (36.8 C) (02/07 1500) Pulse Rate:  [55-68] 57 (02/07 1605) Resp:  [14-25] 17 (02/07 1605) BP: (139-169)/(73-90) 139/73 (02/07 1605) SpO2:  [92 %-97 %] 92 % (02/07 1605)  Intake/Output from previous day: 02/06 0701 - 02/07 0700 In: 120 [P.O.:120] Out: -  Intake/Output this shift: Total I/O In: 180 [P.O.:180] Out: -   Alert, right sided hemineglect.  Oriented to self.  Significant expressive dysphasia. No movement in RUE. Right leg 2/5 proximally, 3/5 distal. + right facial droop. Dressing in place  Lab Results: No results for input(s): WBC, HGB, HCT, PLT in the last 72 hours. BMET No results for input(s): NA, K, CL, CO2, GLUCOSE, BUN, CREATININE, CALCIUM in the last 72 hours.  Studies/Results: No results found.  Assessment/Plan: 70 yo F with metastatic uterine CA and brain mets s/p SRS and resection of large left parietal lobe met. - exam slightly worse today.  Will increase dexamethasone and slow the taper - will likely go to rehab soon - I had a long discussion with the patient's sister.  She certainly has dense neurologic deficits compromising her quality of life.  I would like to see how she does with intense therapies prior to considering changing to comfort care as I hope some of these deficits are reversible and we can restore at least a modicum of her quality of life.  However, if she has decline, I would agree with her sister that further aggressive care such as intubation and chest compressions would be counterproductive and not consistent with the patient's wishes.    LOS: 5 days     Vallarie Mare 01/04/2021, 6:10 PM

## 2021-01-04 NOTE — Progress Notes (Signed)
Physical Therapy Treatment Patient Details Name: Sandra Arroyo MRN: [id number] DOB: 1951/08/23 Today's Date: 01/04/2021    History of Present Illness 70 year old female presenting s/p left frontoparietal craniotomy for resection of tumor on 2/2 to correct itracranial metastases with some mass-effect that had been identified 12/23/2020. 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    PT Comments    The pt was in bed upon arrival of PT/OT, currently overwhelmed by medical situation and significant fatigue at this time. The pt declined OOB transfers at this time, but was agreeable to self-care and exercises on bed-level due to continued desire to maintain strength. The pt continues to present with deficits in strength, awareness, coordination, stability, and command following, requiring multiple repeated cues as well as tactile cues and facilitation at times to assist pt in attending to extremities to complete tasks. Will continue to follow and progress PT plan of care as well as continue to recommend CIR level therapies to facilitate return to OOB mobility and decreased caregiver burden.    Follow Up Recommendations  CIR     Equipment Recommendations  None recommended by PT    Recommendations for Other Services       Precautions / Restrictions Precautions Precautions: Fall Precaution Comments: R hemiplegia, aphasia Restrictions Weight Bearing Restrictions: No    Mobility  Bed Mobility Overal bed mobility: Needs Assistance             General bed mobility comments: deferred bed mobility as pt adamantly stating "no" when asked  Transfers                 General transfer comment: deferred transfer training as pt adamantly stating "no" when asked         Cognition Arousal/Alertness: Awake/alert Behavior During Therapy: WFL for tasks assessed/performed  (emotional/tearful/frustrated at times) Overall Cognitive Status: Difficult to assess Area of Impairment: Attention;Memory;Following commands;Safety/judgement;Awareness;Problem solving                   Current Attention Level: Selective   Following Commands: Follows one step commands inconsistently;Follows one step commands with increased time Safety/Judgement: Decreased awareness of safety;Decreased awareness of deficits Awareness: Intellectual Problem Solving: Slow processing;Requires verbal cues General Comments: pt able to answer some yes/no questions consistently, but was unable to follow commands regarding mobility of either extremity (L or R side) during session. definite R-sided neglect, with possible visual field cut. pt able to bring eyes to midline, but with decreased attention and unable to maintain      Exercises General Exercises - Lower Extremity Ankle Circles/Pumps: Both;5 reps;AAROM Quad Sets: AROM;Both;5 reps Heel Slides: AAROM;5 reps;Both Other Exercises Other Exercises: attempted hand over hand shoulder horizontal ABD/ADD with BUEs positioned in front of pt on bed side table with wash cloth underneath pts hands ( to decrease friction) however pt unable to complete without assist.    General Comments General comments (skin integrity, edema, etc.): VSS through session, pt more tearful and less willing to attempt OOB mobility today stating she feels weaker today      Pertinent Vitals/Pain Pain Assessment: Faces Faces Pain Scale: Hurts little more Pain Location: generalized grimace with mobility Pain Descriptors / Indicators: Grimacing Pain Intervention(s): Monitored during session;Repositioned;Limited activity within patient's tolerance     PT Goals (current goals can now be found in the care plan section) Acute Rehab PT Goals Patient Stated Goal: none stated at this time  but expressed "feel better after moving" PT Goal Formulation: With patient Time For  Goal Achievement: 01/15/21 Potential to Achieve Goals: Fair Progress towards PT goals: Not progressing toward goals - comment (fatigue)    Frequency    Min 4X/week      PT Plan Current plan remains appropriate       AM-PAC PT "6 Clicks" Mobility   Outcome Measure  Help needed turning from your back to your side while in a flat bed without using bedrails?: A Lot Help needed moving from lying on your back to sitting on the side of a flat bed without using bedrails?: A Lot Help needed moving to and from a bed to a chair (including a wheelchair)?: A Lot Help needed standing up from a chair using your arms (e.g., wheelchair or bedside chair)?: Total Help needed to walk in hospital room?: Total Help needed climbing 3-5 steps with a railing? : Total 6 Click Score: 9    End of Session   Activity Tolerance: Patient tolerated treatment well Patient left: in bed;with call bell/phone within reach;with bed alarm set;with family/visitor present Nurse Communication: Mobility status PT Visit Diagnosis: Hemiplegia and hemiparesis;Other abnormalities of gait and mobility (R26.89) Hemiplegia - Right/Left: Right Hemiplegia - dominant/non-dominant: Dominant Hemiplegia - caused by: Unspecified     Time: 3893-7342 PT Time Calculation (min) (ACUTE ONLY): 13 min  Charges:  $Therapeutic Activity: 8-22 mins                     Karma Ganja, PT, DPT   Acute Rehabilitation Department Pager #: [phone]   Otho Bellows 01/04/2021, 3:23 PM

## 2021-01-05 LAB — GLUCOSE, CAPILLARY
Glucose-Capillary: 143 mg/dL — ABNORMAL HIGH (ref 70–99)
Glucose-Capillary: 194 mg/dL — ABNORMAL HIGH (ref 70–99)
Glucose-Capillary: 187 mg/dL — ABNORMAL HIGH (ref 70–99)
Glucose-Capillary: 222 mg/dL — ABNORMAL HIGH (ref 70–99)
Glucose-Capillary: 172 mg/dL — ABNORMAL HIGH (ref 70–99)

## 2021-01-05 NOTE — Progress Notes (Addendum)
Occupational Therapy Treatment Patient Details Name: Sandra Arroyo MRN: [id number] DOB: Apr 10, 1951 Today's Date: 01/05/2021    History of present illness 70 year old female presenting s/p left frontoparietal craniotomy for resection of tumor on 2/2 to correct itracranial metastases with some mass-effect that had been identified 12/23/2020. 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   OT comments  Pt seen in conjunction with PT for second visit to optimize pts activity tolerance and progress pt towards functional mobility goals. Pt continues to present with impaired sitting balance, R sided weakness, pain and aphasia. Pt currently requires MAX- total A + 2 for bed mobility with pt needing up to to MAX A for static sitting balance EOB pt only able to tolerate sitting EOB ~ 3 mins. Pt internally distracted by pain this session.   Doffed R elbow splint during session with no breakdown or redness noted, however pts tone seems to improve when sitting upright and completing PROM. Sister agreeable to work on PROM as pt tolerates. Will set wearing schedule for night time and adjust schedule pending pts participation with therapies and improved AROM, even if RUE used for gross assist. Pt would continue to benefit from skilled occupational therapy while admitted and after d/c to address the below listed limitations in order to improve overall functional mobility and facilitate independence with BADL participation. DC plan remains appropriate, will follow acutely per POC.    Follow Up Recommendations  CIR    Equipment Recommendations  Wheelchair (measurements OT);Wheelchair cushion (measurements OT);3 in 1 bedside commode    Recommendations for Other Services      Precautions / Restrictions Precautions Precautions: Fall Precaution Comments: Rt hemiplegia, aphasia Restrictions Weight Bearing Restrictions:  No       Mobility Bed Mobility Overal bed mobility: Needs Assistance Bed Mobility: Supine to Sit;Sit to Supine     Supine to sit: Max assist;+2 for physical assistance Sit to supine: Total assist;+2 for physical assistance   General bed mobility comments: MAX A +2 to transition to EOB with exiting to Rt side, hand over hand to help pt reach for rail with LUE, cues to turn head and visualize rail, assist with maneuvering BLEs to EOB, total A +2 to return pt to supine d/t pain.  Transfers                 General transfer comment: unable to attempt d/t pain    Balance Overall balance assessment: Needs assistance Sitting-balance support: Single extremity supported;Feet supported;No upper extremity supported Sitting balance-Leahy Scale: Poor Sitting balance - Comments: pt able to maintain static sitting EOB with LUE supported on bed rail with min guard however pt requried MAX A for static sitting balance EOB with no UE support, heavy posterior and R lateral lean. distracted by pain and not able to follow commands to assist at this point. Postural control: Right lateral lean;Posterior lean     Standing balance comment: unable to attempt               High Level Balance Comments: Sister present during session. Discussed disposition with sister/pt re: importance of participation on the acute side of therapy and intensity of CIR.           ADL either performed or assessed with clinical judgement   ADL Overall ADL's : Needs assistance/impaired  Toilet Transfer Details (indicate cue type and reason): pt declined         Functional mobility during ADLs: Maximal assistance;+2 for physical assistance (bed mobility only) General ADL Comments: returned for second session to assess fit ot R elbow splint and in conjunction with PT to progress functional mobility, pt able to tolerate sitting EOB with MAX A +2 however pt continues to present  with impaired sitting balance, R sided weakness, pain and aphasia     Vision Baseline Vision/History: Wears glasses Wears Glasses: Reading only Vision Assessment?: Vision impaired- to be further tested in functional context Additional Comments: pt continues with L gaze preference although pt tracking more to R side this session with cues   Perception     Praxis      Cognition Arousal/Alertness: Awake/alert Behavior During Therapy: Anxious Overall Cognitive Status: Difficult to assess Area of Impairment: Attention;Following commands;Safety/judgement;Awareness;Problem solving                   Current Attention Level: Selective Memory: Decreased short-term memory Following Commands: Follows one step commands inconsistently;Follows one step commands with increased time Safety/Judgement: Decreased awareness of safety;Decreased awareness of deficits Awareness: Intellectual Problem Solving: Slow processing;Requires verbal cues;Decreased initiation General Comments: pt noted to state " no" upon PT/ COTA arrival, utilized communication board during session however feel board maybe too busy, pt noted to attend to Lft side of board only and not consistent with responses when using it. pt was able to point to female picture when asking pt if her sister was in room. pt unable to locate water symbol on Rt side of page when PT asked "how do you ask for water?" pt speaking mostly nonsensically and responding to yes/ no questions inconsistenly during session. pt very distracted by pain this session. Difficult to redirect once sittnig up due to pain. Left gaze preference, but able to track to midline and gaze right for a short period with max cues.        Exercises General Exercises - Upper Extremity Elbow Flexion: PROM;Right;10 reps;Supine   Shoulder Instructions       General Comments VSS on RA. Worked on tone management of RLE which improved once sitting EOB as well as RUE.     Pertinent Vitals/ Pain       Pain Assessment: Faces Faces Pain Scale: Hurts even more Pain Location: generalized grimace with all mobility Pain Descriptors / Indicators: Crying;Discomfort;Moaning;Grimacing Pain Intervention(s): Limited activity within patient's tolerance;Monitored during session;Repositioned;Premedicated before session;Utilized relaxation techniques  Home Living                                          Prior Functioning/Environment              Frequency  Min 2X/week        Progress Toward Goals  OT Goals(current goals can now be found in the care plan section)  Progress towards OT goals: Progressing toward goals  Acute Rehab OT Goals Time For Goal Achievement: 01/15/21 Potential to Achieve Goals: Good  Plan Discharge plan remains appropriate;Frequency remains appropriate    Co-evaluation      Reason for Co-Treatment: Complexity of the patient's impairments (multi-system involvement);For patient/therapist safety;To address functional/ADL transfers;Necessary to address cognition/behavior during functional activity PT goals addressed during session: Mobility/safety with mobility;Balance OT goals addressed during session: ADL's and self-care;Strengthening/ROM      AM-PAC OT "  6 Clicks" Daily Activity     Outcome Measure   Help from another person eating meals?: A Lot Help from another person taking care of personal grooming?: A Lot Help from another person toileting, which includes using toliet, bedpan, or urinal?: Total Help from another person bathing (including washing, rinsing, drying)?: Total Help from another person to put on and taking off regular upper body clothing?: A Lot Help from another person to put on and taking off regular lower body clothing?: Total 6 Click Score: 9    End of Session Equipment Utilized During Treatment: Other (comment) (R elbow splint)  OT Visit Diagnosis: Unsteadiness on feet  (R26.81);Muscle weakness (generalized) (M62.81);Hemiplegia and hemiparesis Hemiplegia - Right/Left: Right Hemiplegia - dominant/non-dominant: Non-Dominant Hemiplegia - caused by: Unspecified   Activity Tolerance Patient limited by pain   Patient Left in bed;with call bell/phone within reach;with bed alarm set;with family/visitor present   Nurse Communication Mobility status        Time: 1203-1229 OT Time Calculation (min): 26 min  Charges: OT General Charges $OT Visit: 1 Visit OT Treatments $Therapeutic Activity: 8-22 mins   Harley Alto., COTA/L Acute Rehabilitation Services [phone]    Precious Haws 01/05/2021, 3:49 PM

## 2021-01-05 NOTE — Progress Notes (Signed)
Inpatient Rehab Admissions Coordinator:   I do not have a CIR bed for this pt. Today. Pt. Refused both PT and OT yesterday. Pt. Was encouraged to work with therapies today, but stated "no" repeatedly when I mentioned it. She was not able to state why she does not work with therapies, but when asked if it was because of pain, she responded "yes." Pt.will need to work consistently with acute rehab therapists to be considered a candidate for CIR. I will continue to follow  For potential admit pending bed availability and participation with therapies.   Clemens Catholic, [place], [place] Admissions Coordinator  [phone] ([place]) [phone] (office)

## 2021-01-05 NOTE — Progress Notes (Signed)
Occupational Therapy Treatment Patient Details Name: Sandra Arroyo MRN: [id number] DOB: 01/07/1951 Today's Date: 01/05/2021    History of present illness 70 year old female presenting s/p left frontoparietal craniotomy for resection of tumor on 2/2 to correct itracranial metastases with some mass-effect that had been identified 12/23/2020. 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   OT comments  Session focus on splint fitting for R elbow splint d/t increased tone in pts RUE. Donned splint on pts RUE and positioned pts RUE on pillow with wrist resting at neutral. Will return in ~ 2 hrs to assess fit and check for skin breakdown. Pt would continue to benefit from skilled occupational therapy while admitted and after d/c to address the below listed limitations in order to improve overall functional mobility and facilitate independence with BADL participation. DC plan remains appropriate, will follow acutely per POC.    Follow Up Recommendations  CIR    Equipment Recommendations  Wheelchair (measurements OT);Wheelchair cushion (measurements OT);3 in 1 bedside commode    Recommendations for Other Services      Precautions / Restrictions Precautions Precautions: Fall Precaution Comments: R hemiplegia, aphasia Restrictions Weight Bearing Restrictions: No       Mobility Bed Mobility                  Transfers                      Balance                                           ADL either performed or assessed with clinical judgement   ADL                                         General ADL Comments: session focus on fitting pt for RUE soft elbow splint to decrease tone in RUE and facilitate improved AROM, donned sling during session and positioned pts RUE on pillow pts wrist resting in neutral position     Vision        Perception     Praxis      Cognition Arousal/Alertness: Awake/alert Behavior During Therapy: WFL for tasks assessed/performed (frustrated during session, tearful at times) Overall Cognitive Status: Difficult to assess Area of Impairment: Attention;Following commands;Safety/judgement;Awareness;Problem solving                   Current Attention Level: Selective   Following Commands: Follows one step commands inconsistently;Follows one step commands with increased time Safety/Judgement: Decreased awareness of safety;Decreased awareness of deficits Awareness: Intellectual Problem Solving: Slow processing;Requires verbal cues General Comments: pt answering yes and no questions inconsistently, can state yes when asked about pain but unable to respond yes/ no when asked about specific body parts causing pain.        Exercises     Shoulder Instructions       General Comments VSS, donned R elbow splint and positioned pts RUE on pillow with wrist resting at neurtral    Pertinent Vitals/ Pain       Pain Assessment: Faces Faces Pain Scale: Hurts a little bit Pain Location: generalized grimace when positioning RUE Pain Descriptors / Indicators: Grimacing  Pain Intervention(s): Monitored during session;Repositioned  Home Living                                          Prior Functioning/Environment              Frequency  Min 2X/week        Progress Toward Goals  OT Goals(current goals can now be found in the care plan section)  Progress towards OT goals: Progressing toward goals  Acute Rehab OT Goals Time For Goal Achievement: 01/15/21 Potential to Achieve Goals: Good  Plan Discharge plan remains appropriate;Frequency remains appropriate    Co-evaluation                 AM-PAC OT "6 Clicks" Daily Activity     Outcome Measure   Help from another person eating meals?: A Lot Help from another person taking care of personal  grooming?: A Lot Help from another person toileting, which includes using toliet, bedpan, or urinal?: Total Help from another person bathing (including washing, rinsing, drying)?: Total Help from another person to put on and taking off regular upper body clothing?: A Lot Help from another person to put on and taking off regular lower body clothing?: Total 6 Click Score: 9    End of Session Equipment Utilized During Treatment: Other (comment) (R elbow splint)  OT Visit Diagnosis: Unsteadiness on feet (R26.81);Muscle weakness (generalized) (M62.81);Hemiplegia and hemiparesis Hemiplegia - Right/Left: Right Hemiplegia - dominant/non-dominant: Non-Dominant Hemiplegia - caused by: Unspecified   Activity Tolerance Patient tolerated treatment well   Patient Left in bed;with call bell/phone within reach;with bed alarm set   Nurse Communication Mobility status;Other (comment) (will return to assess splint)        Time: 8563-1497 OT Time Calculation (min): 13 min  Charges: OT General Charges $OT Visit: 1 Visit OT Treatments $Orthotics Fit/Training: 8-22 mins  Sandra Arroyo., COTA/L Acute Rehabilitation Services [phone]    Sandra Arroyo 01/05/2021, 2:31 PM

## 2021-01-05 NOTE — H&P (Incomplete)
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 Alvy Bimler with oncology service as well as history of diabetes mellitus hypertension and hyperparathyroidism. Per chart review lives with spouse. Independent prior to admission. 1 level home. She has been staying with her sister at times during her cancer treatments. She has excellent family support. She was found to have metastatic CNS disease including 1 small left frontal and right frontal metastasis and a large left parietal metastasis measuring 4.6 cm with significant vasogenic edema. Patient underwent left craniotomy for resection of tumor 01/01/2021 per Dr. Duffy Rhody. MRI of the brain showed no unexpected findings after left frontal parietal metastasis resection smaller bifrontal metastasis unchanged. Patient was to be followed by Dr. Mickeal Skinner currently maintained on Decadron therapy. Keppra was added for seizure prophylaxis x14 days total. She was cleared to begin Lovenox for DVT prophylaxis 12/31/2020. Tolerating a regular diet. Therapy evaluations completed due to patient's right side weakness was admitted for a comprehensive rehab program.  Review of Systems  Constitutional: Negative for chills and fever.  HENT: Negative for hearing loss.   Eyes: Negative for blurred vision and double vision.  Respiratory: Negative for cough and shortness of breath.   Cardiovascular: Negative for chest pain, palpitations and leg swelling.  Gastrointestinal: Positive for constipation. Negative for heartburn, nausea and vomiting.  Genitourinary: Negative for dysuria, flank pain and hematuria.  Musculoskeletal: Positive for joint pain and myalgias.  Neurological: Positive for speech change, weakness and headaches.  All other systems reviewed and are negative.  Past  Medical History:  Diagnosis Date  . Cancer ([place]) 2019  . Diabetes mellitus without complication ([place])   . Family history of adverse reaction to anesthesia   . Hyperparathyroidism, unspecified ([place])   . Hypertension   . PONV (postoperative nausea and vomiting)    Past Surgical History:  Procedure Laterality Date  . ABDOMINAL HYSTERECTOMY    . APPLICATION OF CRANIAL NAVIGATION N/A 12/31/2020   Procedure: APPLICATION OF CRANIAL NAVIGATION;  Surgeon: Vallarie Mare, MD;  Location: [facility];  Service: Neurosurgery;  Laterality: N/A;  . CESAREAN SECTION    . CHOLECYSTECTOMY    . CRANIOTOMY Left 01/01/2021   Procedure: LEFT CRANIOTOMY FOR RESECTON OF TUMOR WITH BRAIN LAB;  Surgeon: Vallarie Mare, MD;  Location: [facility];  Service: Neurosurgery;  Laterality: Left;  . PARATHYROIDECTOMY    . TONSILLECTOMY     Family History  Problem Relation Age of Onset  . Cancer Mother 48       breast ca  . Cancer Father        testicular, kidney cancer, liver  . Stroke Father   . Diabetes Father   . Cancer Brother        testicular cancer   Social History:  reports that she has never smoked. She has never used smokeless tobacco. She reports that she does not drink alcohol and does not use drugs. Allergies:  Allergies  Allergen Reactions  . Lisinopril Swelling    Face swelling Face swelling   . Verapamil Rash  . Other Rash    ADHESIVES ADHESIVES ADHESIVES ADHESIVES    Medications Prior to Admission  Medication Sig Dispense Refill  . amLODipine (NORVASC) 10 MG tablet Take 1 tablet (10 mg total) by mouth daily. 90 tablet 3  . clonazePAM (KLONOPIN) 0.5 MG tablet Take 1  tablet (0.5 mg total) by mouth at bedtime as needed (sleep). 30 tablet 3  . dexamethasone (DECADRON) 4 MG tablet Take 1 tablet (4 mg total) by mouth 3 (three) times daily. 90 tablet 1  . famotidine (PEPCID) 20 MG tablet Take 1 tablet (20 mg total) by mouth 2 (two) times daily. 60 tablet 0  . hydrALAZINE (APRESOLINE) 25 MG  tablet Take 1 tablet (25 mg total) by mouth 3 (three) times daily. 270 tablet 3  . HYDROcodone-acetaminophen (NORCO/VICODIN) 5-325 MG tablet Take 1 tablet by mouth every 6 (six) hours as needed. (Patient taking differently: Take 1 tablet by mouth every 6 (six) hours as needed for moderate pain.) 60 tablet 0  . insulin aspart (NOVOLOG) 100 UNIT/ML FlexPen CBG 70 - 120: 0 units  CBG 121 - 150: 2 units  CBG 151 - 200: 3 units  CBG 201 - 250: 5 units  CBG 251 - 300: 8 units  CBG 301 - 350: 11 units  CBG 351 - 400: 15 units 15 mL 0  . levETIRAcetam (KEPPRA) 500 MG tablet Take 1 tablet (500 mg total) by mouth 2 (two) times daily. 60 tablet 0  . levothyroxine (SYNTHROID) 25 MCG tablet Take 1 tablet (25 mcg total) by mouth daily before breakfast. 30 tablet 11  . lidocaine-prilocaine (EMLA) cream Apply to affected area once 30 g 3  . losartan (COZAAR) 100 MG tablet Take 100 mg by mouth daily.    . metoprolol succinate (TOPROL-XL) 100 MG 24 hr tablet Take 1 tablet (100 mg total) by mouth daily. Take with or immediately following a meal. 30 tablet 1  . pantoprazole (PROTONIX) 20 MG tablet Take 1 tablet (20 mg total) by mouth daily. 30 tablet 0  . rosuvastatin (CRESTOR) 10 MG tablet Take 1 tablet (10 mg total) by mouth daily. 90 tablet 2  . terazosin (HYTRIN) 2 MG capsule Take 1 capsule (2 mg total) by mouth at bedtime. 30 capsule 1  . blood glucose meter kit and supplies Dispense based on patient and insurance preference. Use up to four times daily as directed. (FOR ICD-10 E10.9, E11.9). 1 each 0  . Insulin Pen Needle (PEN NEEDLES 3/16") 31G X 5 MM MISC Use as directed with insulin pen 100 each 0  . ondansetron (ZOFRAN) 8 MG tablet Take 1 tablet (8 mg total) by mouth every 8 (eight) hours as needed (Nausea or vomiting). 30 tablet 1  . prochlorperazine (COMPAZINE) 10 MG tablet Take 1 tablet (10 mg total) by mouth every 6 (six) hours as needed (Nausea or vomiting). 30 tablet 1    Drug Regimen Review Drug  regimen was reviewed and remains appropriate with no significant issues identified  Home: Home Living Family/patient expects to be discharged to:: Private residence Living Arrangements: Spouse/significant other,Other relatives (staying with sister in [place] but lives in a different home with spouse. only stays with sister during CA treatments) Available Help at Discharge: Family,Available 24 hours/day Type of Home: House Home Access: Level entry Entrance Stairs-Number of Steps: 0 Entrance Stairs-Rails: Left,Right Home Layout: One level Bathroom Shower/Tub: Chiropodist: Standard Home Equipment: None Additional Comments: information is the sister home. Regular 4 stairs at home to main level and tub shower combo   Functional History: Prior Function Level of Independence: Independent  Functional Status:  Mobility: Bed Mobility Overal bed mobility: Needs Assistance Bed Mobility: Supine to Sit,Sit to Supine Supine to sit: Mod assist,+2 for physical assistance Sit to supine: Mod assist,+2 for physical assistance General  bed mobility comments: deferred bed mobility as pt adamantly stating "no" when asked Transfers Overall transfer level: Needs assistance Equipment used: 2 person hand held assist Transfers: Sit to/from Stand Sit to Stand: Max assist,+2 physical assistance Stand pivot transfers: +2 physical assistance,Max assist General transfer comment: deferred transfer training as pt adamantly stating "no" when asked Ambulation/Gait General Gait Details: unable at this time    ADL: ADL Overall ADL's : Needs assistance/impaired Eating/Feeding: Moderate assistance,Bed level Grooming: Oral care,Bed level,Supervision/safety,Set up,Wash/dry face Grooming Details (indicate cue type and reason): from sitting up in bed, pt required set- up of toothbrush placed in pts L hand and hand under hand assist to apply paste to brush with brush in RUE ( hand over hand  grasp to maintain positioning inR hand) however pt able to brush teeth with overall set- up- supervision assist with LUE.pt also able to wash face with LUE when wash cloth places in pts L hand Lower Body Dressing: Maximal assistance Lower Body Dressing Details (indicate cue type and reason): don socks able to lift leg to help Toilet Transfer Details (indicate cue type and reason): pt declined General ADL Comments: pt admanantly stating "no" offered OOB mobility with pt becoming tearful, therefor session focus on BADL reeducation from bed level and bilateral integration tasks  Cognition: Cognition Overall Cognitive Status: Difficult to assess Orientation Level: Oriented to person,Oriented to place (Yes and No questions) Cognition Arousal/Alertness: Awake/alert Behavior During Therapy: WFL for tasks assessed/performed (emotional/tearful/frustrated at times) Overall Cognitive Status: Difficult to assess Area of Impairment: Attention,Memory,Following commands,Safety/judgement,Awareness,Problem solving Current Attention Level: Selective Following Commands: Follows one step commands inconsistently,Follows one step commands with increased time Safety/Judgement: Decreased awareness of safety,Decreased awareness of deficits Awareness: Intellectual Problem Solving: Slow processing,Requires verbal cues General Comments: pt able to answer some yes/no questions consistently, but was unable to follow commands regarding mobility of either extremity (L or R side) during session. definite R-sided neglect, with possible visual field cut. pt able to bring eyes to midline, but with decreased attention and unable to maintain Difficult to assess due to: Impaired communication  Physical Exam: Blood pressure 140/78, pulse (!) 56, temperature (!) 97.5 F (36.4 C), temperature source Axillary, resp. rate 12, height '5\' 2"'  (1.575 m), weight 66.7 kg, SpO2 90 %. Physical Exam HENT:     Head:     Comments: Craniotomy  site clean and dry Neurological:     Comments: Patient is alert in no acute distress. Makes eye contact with examiner. Follows simple commands. She does have some delay in processing but provides her name and age.     Results for orders placed or performed during the hospital encounter of 01/04/2021 (from the past 48 hour(s))  Glucose, capillary     Status: Abnormal   Collection Time: 01/03/21 12:41 PM  Result Value Ref Range   Glucose-Capillary 193 (H) 70 - 99 mg/dL    Comment: Glucose reference range applies only to samples taken after fasting for at least 8 hours.  Glucose, capillary     Status: Abnormal   Collection Time: 01/03/21  4:48 PM  Result Value Ref Range   Glucose-Capillary 187 (H) 70 - 99 mg/dL    Comment: Glucose reference range applies only to samples taken after fasting for at least 8 hours.  Glucose, capillary     Status: Abnormal   Collection Time: 01/03/21  9:01 PM  Result Value Ref Range   Glucose-Capillary 166 (H) 70 - 99 mg/dL    Comment: Glucose reference range applies  only to samples taken after fasting for at least 8 hours.  Glucose, capillary     Status: Abnormal   Collection Time: 01/04/21  7:03 AM  Result Value Ref Range   Glucose-Capillary 150 (H) 70 - 99 mg/dL    Comment: Glucose reference range applies only to samples taken after fasting for at least 8 hours.  Glucose, capillary     Status: Abnormal   Collection Time: 01/04/21 11:59 AM  Result Value Ref Range   Glucose-Capillary 134 (H) 70 - 99 mg/dL    Comment: Glucose reference range applies only to samples taken after fasting for at least 8 hours.   Comment 1 Notify RN    Comment 2 Document in Chart   Glucose, capillary     Status: Abnormal   Collection Time: 01/04/21  3:29 PM  Result Value Ref Range   Glucose-Capillary 145 (H) 70 - 99 mg/dL    Comment: Glucose reference range applies only to samples taken after fasting for at least 8 hours.   Comment 1 Notify RN    Comment 2 Document in Chart    Glucose, capillary     Status: Abnormal   Collection Time: 01/04/21  9:12 PM  Result Value Ref Range   Glucose-Capillary 173 (H) 70 - 99 mg/dL    Comment: Glucose reference range applies only to samples taken after fasting for at least 8 hours.  Glucose, capillary     Status: Abnormal   Collection Time: 01/05/21  6:55 AM  Result Value Ref Range   Glucose-Capillary 143 (H) 70 - 99 mg/dL    Comment: Glucose reference range applies only to samples taken after fasting for at least 8 hours.   No results found.     Medical Problem List and Plan: 1. Right side weakness and slurred speech secondary to metastatic endometrial cancer to the left frontal parietal brain status post tumor resection 01/16/2021. Decadron as directed  -patient may *** shower  -ELOS/Goals: *** 2.  Antithrombotics: -DVT/anticoagulation: Lovenox initiated 12/31/2020  -antiplatelet therapy: N/A 3. Pain Management: Hydrocodone as needed, Robaxin as needed 4. Mood: Klonopin 0.5 mg nightly as needed  -antipsychotic agents: N/A 5. Neuropsych: This patient is capable of making decisions on her own behalf. 6. Skin/Wound Care: Routine skin checks 7. Fluids/Electrolytes/Nutrition: Routine in and outs with follow-up chemistries 8. Seizure prophylaxis. 14-day course of Keppra 500 mg twice daily 9. Hypertension. Norvasc 10 mg daily, Cozaar 100 mg daily, Hytrin 2 mg nightly, Toprol-XL 100 mg daily, hydralazine 25 mg 3 times daily. Monitor with increased mobility 10. Diabetes mellitus. Hemoglobin A1c 6.9. NovoLog sliding scale. 11. Hypothyroidism. Synthroid 12. Hypothyroidism. Crestor   ***  Lavon Paganini Dyllon Henken, PA-C 01/05/2021

## 2021-01-05 NOTE — Progress Notes (Signed)
Subjective: Patient tearful this morning.  Objective: Vital signs in last 24 hours: Temp:  [97.5 F (36.4 C)-98.8 F (37.1 C)] 97.8 F (36.6 C) (02/08 0900) Pulse Rate:  [56-68] 56 (02/08 0300) Resp:  [12-19] 12 (02/08 0300) BP: (138-169)/(72-84) 159/79 (02/08 0900) SpO2:  [90 %-97 %] 90 % (02/08 0300)  Intake/Output from previous day: 02/07 0701 - 02/08 0700 In: 180 [P.O.:180] Out: 400 [Urine:400] Intake/Output this shift: Total I/O In: 240 [P.O.:240] Out: 375 [Urine:375]  Dressing removed.  Some congealed blood over superior aspect of incision, no active drainage Severe expressive dysphasia, says her first name. R facial droop, 0/5 RUE, 2-3/5 RLE.    Lab Results: No results for input(s): WBC, HGB, HCT, PLT in the last 72 hours. BMET No results for input(s): NA, K, CL, CO2, GLUCOSE, BUN, CREATININE, CALCIUM in the last 72 hours.  Studies/Results: No results found.  Assessment/Plan: 70 yo F with metastatic uterine CA s/p SRS and resection of large L parietal lobe met - preoperatively, she had fairly dense and progressive deficits, and unfortunately, she has had a difficult neurologic recovery thus far.  However, I would like to give her a few more weeks including rehab to see if she is able to achieve some improvement in her neurologic and functional status.  If she shows little improvement in her communication and/or motor abilities, it would certainly be sensible to change the goals of care to pure palliation rather than pursue continued aggressive oncologic treatments.  - in my discussion with the daughter yesterday, the family expressed strong interest in allowing her two sons who live out of town to see her in the inpatient setting during her recovery.  The hospital has recently adopted a policy of allowing only a single visitor for the entire hospital stay, with exceptions for comfort care scenarios.  Although she is not comfort care, as there is a strong possibility that  goals of care may change, I do believe we should make an exception in her case and allow one visitor each day if needed to allow her children to see her.  I discussed this with the charge nurse Chrys Racer.    Vallarie Mare 01/05/2021, 10:43 AM

## 2021-01-05 NOTE — Progress Notes (Signed)
Physical Therapy Treatment Patient Details Name: Sandra Arroyo MRN: [id number] DOB: 13-Jan-1951 Today's Date: 01/05/2021    History of Present Illness 70 year old female presenting s/p left frontoparietal craniotomy for resection of tumor on 2/2 to correct itracranial metastases with some mass-effect that had been identified 12/23/2020. 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    PT Comments    Patient progressing very slowly towards PT goals. Requires explanation and importance of mobility/participation on the acute side of therapy to qualify for CIR despite pain. Pt premedicated prior to session. Noted to have increased extensor tone in RUE/RLE however this improved once sitting EOB. Requires max-total A of 2 for bed mobility with step by step cues for technique. Initially pt requiring Min guard assist for static sitting balance with LUE support however once fatigued and removed LUE, pt with posterior and right lateral lean needing Max A to maintain upright. Pt distracted by pain once sitting EOB and having difficulty focusing on tasks. Noted to have left gaze preference but able to get to midline and gaze right with max cues but not sustain. Attempted communication board however pt having difficulty using it at this time, appears to be too busy. Following 1 step commands inconsistently with max multi modal cues. Will continue to follow and progress as tolerated.   Follow Up Recommendations  CIR (pending progress)     Equipment Recommendations  None recommended by PT    Recommendations for Other Services       Precautions / Restrictions Precautions Precautions: Fall Precaution Comments: Rt hemiplegia, aphasia Restrictions Weight Bearing Restrictions: No    Mobility  Bed Mobility Overal bed mobility: Needs Assistance Bed Mobility: Supine to Sit;Sit to Supine     Supine to sit: Max  assist;+2 for physical assistance Sit to supine: Total assist;+2 for physical assistance   General bed mobility comments: MAX A +2 to transition to EOB with exiting to Rt side, hand over hand to help pt reach for rail with LUE, cues to turn head and visualize rail, assist with maneuvering BLEs to EOB, total A +2 to return pt to supine d/t pain.  Transfers                 General transfer comment: unable to attempt d/t pain  Ambulation/Gait                 Stairs             Wheelchair Mobility    Modified Rankin (Stroke Patients Only) Modified Rankin (Stroke Patients Only) Pre-Morbid Rankin Score: Slight disability Modified Rankin: Severe disability     Balance Overall balance assessment: Needs assistance Sitting-balance support: Single extremity supported;Feet supported;No upper extremity supported Sitting balance-Leahy Scale: Poor Sitting balance - Comments: pt able to maintain static sitting EOB with LUE supported on bed rail with min guard however pt requried MAX A for static sitting balance EOB with no UE support, heavy posterior and R lateral lean. distracted by pain and not able to follow commands to assist at this point. Postural control: Right lateral lean;Posterior lean     Standing balance comment: unable to attempt               High Level Balance Comments: Sister present during session. Discussed disposition with sister/pt re: importance of participation on the acute side of therapy and intensity of CIR.  Cognition Arousal/Alertness: Awake/alert Behavior During Therapy: Anxious Overall Cognitive Status: Difficult to assess Area of Impairment: Attention;Following commands;Safety/judgement;Awareness;Problem solving                   Current Attention Level: Selective Memory: Decreased short-term memory Following Commands: Follows one step commands inconsistently;Follows one step commands with increased  time Safety/Judgement: Decreased awareness of safety;Decreased awareness of deficits Awareness: Intellectual Problem Solving: Slow processing;Requires verbal cues;Decreased initiation General Comments: pt noted to state " no" upon PT/ COTA arrival, utilized communication board during session however feel board maybe too busy, pt noted to attend to Lft side of board only and not consistent with responses when using it. pt was able to point to female picture when asking pt if her sister was in room. pt unable to locate water symbol on Rt side of page when PT asked "how do you ask for water?" pt speaking mostly nonsensically and responding to yes/ no questions inconsistenly during session. pt very distracted by pain this session. Difficult to redirect once sittnig up due to pain. Left gaze preference, but able to track to midline and gaze right for a short period with max cues.      Exercises General Exercises - Upper Extremity Elbow Flexion: PROM;Right;10 reps;Supine    General Comments General comments (skin integrity, edema, etc.): VSS on RA. Worked on tone management of RLE which improved once sitting EOB as well as RUE.      Pertinent Vitals/Pain Pain Assessment: Faces Faces Pain Scale: Hurts even more Pain Location: generalized grimace with all mobility Pain Descriptors / Indicators: Crying;Discomfort;Moaning;Grimacing Pain Intervention(s): Limited activity within patient's tolerance;Monitored during session;Repositioned;Premedicated before session;Utilized relaxation techniques    Home Living                      Prior Function            PT Goals (current goals can now be found in the care plan section) Progress towards PT goals: Progressing toward goals (slowly, pain limiting)    Frequency    Min 4X/week      PT Plan Current plan remains appropriate    Co-evaluation PT/OT/SLP Co-Evaluation/Treatment: Yes Reason for Co-Treatment: Complexity of the patient's  impairments (multi-system involvement);For patient/therapist safety;To address functional/ADL transfers;Necessary to address cognition/behavior during functional activity PT goals addressed during session: Mobility/safety with mobility;Balance OT goals addressed during session: ADL's and self-care;Strengthening/ROM      AM-PAC PT "6 Clicks" Mobility   Outcome Measure  Help needed turning from your back to your side while in a flat bed without using bedrails?: A Lot Help needed moving from lying on your back to sitting on the side of a flat bed without using bedrails?: Total Help needed moving to and from a bed to a chair (including a wheelchair)?: A Lot Help needed standing up from a chair using your arms (e.g., wheelchair or bedside chair)?: Total Help needed to walk in hospital room?: Total Help needed climbing 3-5 steps with a railing? : Total 6 Click Score: 8    End of Session   Activity Tolerance: Patient limited by pain Patient left: in bed;with call bell/phone within reach;with bed alarm set;with family/visitor present Nurse Communication: Mobility status;Need for lift equipment PT Visit Diagnosis: Hemiplegia and hemiparesis;Other abnormalities of gait and mobility (R26.89) Hemiplegia - Right/Left: Right Hemiplegia - dominant/non-dominant: Dominant Hemiplegia - caused by: Unspecified     Time: 1203-1230 PT Time Calculation (min) (ACUTE ONLY): 27 min  Charges:  $Therapeutic  Activity: 8-22 mins                     Marisa Severin, PT, DPT Acute Rehabilitation Services Pager [phone] Office [place] 01/05/2021, 3:48 PM

## 2021-01-06 LAB — GLUCOSE, CAPILLARY
Glucose-Capillary: 128 mg/dL — ABNORMAL HIGH (ref 70–99)
Glucose-Capillary: 189 mg/dL — ABNORMAL HIGH (ref 70–99)
Glucose-Capillary: 173 mg/dL — ABNORMAL HIGH (ref 70–99)

## 2021-01-06 MED ORDER — OXYCODONE HCL 5 MG PO TABS
5.0000 mg | ORAL_TABLET | Freq: Once | ORAL | Status: AC
Start: 2021-01-06 — End: 2021-01-06
  Administered 2021-01-06: 5 mg via ORAL
  Filled 2021-01-06: qty 1

## 2021-01-06 NOTE — Progress Notes (Signed)
Subjective: NAEs  Objective: Vital signs in last 24 hours: Temp:  [97.7 F (36.5 C)-98.5 F (36.9 C)] 97.7 F (36.5 C) (02/09 0300) Pulse Rate:  [52-64] 52 (02/09 0835) Resp:  [20-22] 20 (02/09 0835) BP: (122-159)/(66-79) 152/76 (02/09 0835) SpO2:  [95 %-97 %] 96 % (02/09 0835)  Intake/Output from previous day: 02/08 0701 - 02/09 0700 In: 360 [P.O.:360] Out: 1200 [Urine:1200] Intake/Output this shift: No intake/output data recorded.  Awake, alert Somewhat more verbal today, says first and last name. Still with right hemineglect, RUE 0/5, RLE 2/5, right facial droop.  Difficulty following complex commands.  Lab Results: No results for input(s): WBC, HGB, HCT, PLT in the last 72 hours. BMET No results for input(s): NA, K, CL, CO2, GLUCOSE, BUN, CREATININE, CALCIUM in the last 72 hours.  Studies/Results: No results found.  Assessment/Plan: 70 yo F s/p crani for resection of large brain met - likely to CIR soon  LOS: 7 days     Sandra Arroyo 01/06/2021, 8:52 AM

## 2021-01-06 NOTE — Progress Notes (Signed)
Inpatient Rehab Admissions Coordinator:    Noted pt. Refusing most therapy for the past 3 days. She did agree to bed level exercises yesterday, but was limited by pain beyond that. I spoke to her sister, [name], who is her primary caregiver about the fact that Pt.may not be a CIR candidate if she is only tolerating bed level exercises. She is also concerned that she will not be able to provide the level of assist that pt. Will likely need at d/c from CIR. She would like to consider SNFs in [place], [place] (where Pt.'s husband and sons live) or hospice in [place]. I will reach out to the medical team for further discussion  Clemens Catholic, [place], [place] Admissions Coordinator  [phone] (celll) [phone] (office)

## 2021-01-06 NOTE — Progress Notes (Signed)
Pt crying out and in pain awoken from sleep to the pain. RN had already given the Q2H morphine and the patient's Q6H Norco. RN called Neurosurgeon on call and was given a one time does for oxy 5mg .

## 2021-01-06 NOTE — Progress Notes (Signed)
Physical Therapy Treatment Patient Details Name: Sandra Arroyo MRN: [id number] DOB: Jul 30, 1951 Today's Date: 01/06/2021    History of Present Illness 70 year old female presenting s/p left frontoparietal craniotomy for resection of tumor on 2/2 to correct itracranial metastases with some mass-effect that had been identified 12/23/2020. 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    PT Comments    Pt does not tolerate treatment well on this date. Initially pt refusing but becomes agreeable to attempt mobility with PT encouragement. Pt continues to require significant physical assistance to achieve sitting position and assistance for all sitting balance due to R lateral lean. Pt becomes very tearful when sitting and refuses all other activity besides returning to supine. Pt reports she has pain all over, otherwise it is difficult to determine other reasons for her refusal of mobility due to her expressive aphasia. Pt will benefit from continued attempts at mobility to improve activity tolerance and reduce caregiver burden. PT updates recommendations to SNF at this time due to limited activity tolerance.   Follow Up Recommendations  SNF;Supervision/Assistance - 24 hour     Equipment Recommendations  Wheelchair (measurements PT);Wheelchair cushion (measurements PT);Hospital bed (mechanical lift, all if D/C home today)    Recommendations for Other Services       Precautions / Restrictions Precautions Precautions: Fall Precaution Comments: Rt hemiplegia, aphasia Restrictions Weight Bearing Restrictions: No    Mobility  Bed Mobility Overal bed mobility: Needs Assistance Bed Mobility: Supine to Sit;Sit to Supine     Supine to sit: Max assist;HOB elevated Sit to supine: Mod assist;HOB elevated   General bed mobility comments: pt requires assistance for LE mobility and to elevate trunk into  sitting. Pt brings trunk back to bed but requires assistance for LE management when returning to bed  Transfers                    Ambulation/Gait                 Stairs             Wheelchair Mobility    Modified Rankin (Stroke Patients Only) Modified Rankin (Stroke Patients Only) Pre-Morbid Rankin Score: Slight disability Modified Rankin: Severe disability     Balance Overall balance assessment: Needs assistance Sitting-balance support: Single extremity supported;Feet supported Sitting balance-Leahy Scale: Poor Sitting balance - Comments: pt requires LUE support of bed rail, otherwise with strong right lateral lean Postural control: Right lateral lean                                  Cognition Arousal/Alertness: Awake/alert Behavior During Therapy: Anxious Overall Cognitive Status: Difficult to assess Area of Impairment: Attention;Memory;Following commands;Safety/judgement;Awareness;Problem solving                   Current Attention Level: Sustained   Following Commands: Follows one step commands with increased time Safety/Judgement: Decreased awareness of deficits;Decreased awareness of safety Awareness: Intellectual Problem Solving: Slow processing;Requires verbal cues        Exercises      General Comments General comments (skin integrity, edema, etc.): pt is crying throughout mobility, anxious even prior to mobilizing. When sitting edge of bed pt refuses attempts at standing or mobilizing out of bed. Pt reports she hurts all over, otherwise is unable to report reasons for why she does  not want to mobilize      Pertinent Vitals/Pain Pain Assessment: Faces Faces Pain Scale: Hurts even more Pain Location: all over per patient Pain Descriptors / Indicators: Crying Pain Intervention(s): Monitored during session    Home Living                      Prior Function            PT Goals (current goals can  now be found in the care plan section) Acute Rehab PT Goals Patient Stated Goal: none stated Progress towards PT goals: Not progressing toward goals - comment (limited by pain and behavior)    Frequency    Min 3X/week      PT Plan Discharge plan needs to be updated    Co-evaluation              AM-PAC PT "6 Clicks" Mobility   Outcome Measure  Help needed turning from your back to your side while in a flat bed without using bedrails?: A Lot Help needed moving from lying on your back to sitting on the side of a flat bed without using bedrails?: A Lot Help needed moving to and from a bed to a chair (including a wheelchair)?: Total Help needed standing up from a chair using your arms (e.g., wheelchair or bedside chair)?: Total Help needed to walk in hospital room?: Total Help needed climbing 3-5 steps with a railing? : Total 6 Click Score: 8    End of Session   Activity Tolerance: Patient limited by pain;Other (comment) (limited by anxiety/behavior) Patient left: in bed;with call bell/phone within reach;with bed alarm set Nurse Communication: Mobility status;Need for lift equipment PT Visit Diagnosis: Hemiplegia and hemiparesis;Other abnormalities of gait and mobility (R26.89) Hemiplegia - Right/Left: Right Hemiplegia - dominant/non-dominant: Dominant Hemiplegia - caused by: Unspecified     Time: 1150-1206 PT Time Calculation (min) (ACUTE ONLY): 16 min  Charges:  $Therapeutic Activity: 8-22 mins                     Zenaida Niece, PT, DPT Acute Rehabilitation Pager: [phone]    Zenaida Niece 01/06/2021, 1:19 PM

## 2021-01-06 NOTE — Progress Notes (Signed)
  Speech Language Pathology Treatment: Cognitive-Linquistic  Patient Details Name: Sandra Arroyo MRN: [id number] DOB: [birth year] Today's Date: 01/06/2021 Time: 4196-2229 SLP Time Calculation (min) (ACUTE ONLY): 10 min  Assessment / Plan / Recommendation Clinical Impression  Pt appeared in discomfort, prefers to keep eyes closed, flushed appearance, restless, frustrated and eventually started to cry. She was unable to name common objects with phrase completion and phonemic cue, unable to respond to simple biographical questions. Her verbalizations were incomprehensible and not approximated to targeted word. Attempted to sing familiar song and she began to cry and nurse arrived with pain meds. Spoke with sister briefly and noted documentation for SNF or possible hospice. Will continue intervention.     HPI HPI: 70yo female admitted 01/24/2021 with right side weakness, speech difficulty. Pt was found to have metastatic brain disease with 3 lesions PMH: uterine, lung cancer, DM, hyperparathyroidism, essential HTN. Pt underwent left frontoparietal craniotomy for resection of tumor      SLP Plan  Continue with current plan of care       Recommendations                   Oral Care Recommendations: Oral care BID Follow up Recommendations: Skilled Nursing facility SLP Visit Diagnosis: Aphasia (R47.01) Plan: Continue with current plan of care                       Houston Siren 01/06/2021, 3:48 PM   Orbie Pyo Colvin Caroli.Ed Risk analyst [phone] Office [phone]

## 2021-01-07 DIAGNOSIS — C7931 Secondary malignant neoplasm of brain: Principal | ICD-10-CM

## 2021-01-07 DIAGNOSIS — Z515 Encounter for palliative care: Secondary | ICD-10-CM

## 2021-01-07 DIAGNOSIS — Z7189 Other specified counseling: Secondary | ICD-10-CM | POA: Diagnosis not present

## 2021-01-07 LAB — GLUCOSE, CAPILLARY
Glucose-Capillary: 137 mg/dL — ABNORMAL HIGH (ref 70–99)
Glucose-Capillary: 150 mg/dL — ABNORMAL HIGH (ref 70–99)

## 2021-01-07 MED ORDER — GLYCOPYRROLATE 0.2 MG/ML IJ SOLN
0.2000 mg | INTRAMUSCULAR | Status: DC | PRN
  Filled 2021-01-07: qty 1

## 2021-01-07 MED ORDER — MORPHINE SULFATE (PF) 2 MG/ML IV SOLN
2.0000 mg | INTRAVENOUS | Status: DC | PRN
  Administered 2021-01-07 – 2021-01-08 (×8): 2 mg via INTRAVENOUS
  Filled 2021-01-07 (×6): qty 1

## 2021-01-07 MED ORDER — ENSURE ENLIVE PO LIQD: 237.0000 mL | Freq: Two times a day (BID) | ORAL | Status: DC | PRN

## 2021-01-07 MED ORDER — MORPHINE SULFATE (PF) 2 MG/ML IV SOLN
2.0000 mg | INTRAVENOUS | Status: DC
Start: 2021-01-07 — End: 2021-01-08
  Administered 2021-01-07 – 2021-01-08 (×4): 2 mg via INTRAVENOUS
  Filled 2021-01-07 (×6): qty 1

## 2021-01-07 MED ORDER — GLYCOPYRROLATE 1 MG PO TABS
1.0000 mg | ORAL_TABLET | ORAL | Status: DC | PRN
Start: 2021-01-07 — End: 2021-01-08

## 2021-01-07 MED ORDER — GLYCOPYRROLATE 0.2 MG/ML IJ SOLN
0.2000 mg | INTRAMUSCULAR | Status: DC | PRN
  Administered 2021-01-08 – 2021-01-09 (×4): 0.2 mg via INTRAVENOUS
  Filled 2021-01-07 (×4): qty 1

## 2021-01-07 MED ORDER — LORAZEPAM 2 MG/ML IJ SOLN: 1.0000 mg | INTRAMUSCULAR | Status: DC | PRN

## 2021-01-07 MED ORDER — BIOTENE DRY MOUTH MT LIQD: 15.0000 mL | OROMUCOSAL | Status: DC | PRN

## 2021-01-07 MED ORDER — POLYVINYL ALCOHOL 1.4 % OP SOLN
1.0000 [drp] | Freq: Four times a day (QID) | OPHTHALMIC | Status: DC | PRN
  Filled 2021-01-07: qty 15

## 2021-01-07 NOTE — TOC Initial Note (Signed)
Transition of Care [facility]) - Initial/Assessment Note    Patient Details  Name: Sandra Arroyo MRN: [id number] Date of Birth: [birth year]  Transition of Care [facility]) CM/SW Contact:    Vinie Sill, [place] Phone Number: 01/07/2021, 1:59 PM  Clinical Narrative:                  CSW spoke with patient's sister,Ann by phone-CSW introduced self and explained role. Ann, expressed family is hopeful to get patient closer to home [facility]), which is about 4 hrs from Hospital. She states there is only one SNF in that area and the patient's husband(Ronnie # [phone]) is trying to get bed there. Ann not sure of the facility's name but will call CSW back with that  information. She states family is aware transportation will be out of pocket expense.   CSW will continue to follow and assist with discharge planning   Thurmond Butts, MSW, LCSW Clinical Social Worker   Expected Discharge Plan: Skilled Nursing Facility Barriers to Discharge: SNF Pending bed offer   Patient Goals and CMS Choice        Expected Discharge Plan and Services Expected Discharge Plan: [place]                                              Prior Living Arrangements/Services                       Activities of Daily Living      Permission Sought/Granted                  Emotional Assessment              Admission diagnosis:  Status post craniotomy [Z98.890] Brain metastasis [facility]) [C79.31] Patient Active Problem List   Diagnosis Date Noted  . Status post craniotomy 01/14/2021  . Brain metastasis ([place]) 12/23/2020  . Metastasis to brain ([place]) 12/22/2020  . Acquired hypothyroidism 12/11/2020  . Chronic diarrhea 12/01/2020  . Skin rash 12/01/2020  . 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 Type 2 diabetes mellitus with hyperglycemia, without long-term current use of insulin ([place]) 10/02/2020  . Hyperparathyroidism, unspecified ([place])   . Port-A-Cath  in place 01/13/2020  . Pulmonary infiltrates 12/19/2019  . Kidney lesion, native, left 12/19/2019  . Goals of care, counseling/discussion 12/12/2019  . Uterine cancer ([place]) 12/11/2019  . Metastasis to lymph nodes ([place]) 12/11/2019  . Other fatigue 12/11/2019  . Pancytopenia, acquired ([place]) 12/11/2019  . Family history of cancer 12/11/2019  . Diabetes mellitus without complication, without long-term current use of insulin ([place]) 12/11/2019  . Essential hypertension 12/11/2019  . Peripheral neuropathy due to chemotherapy ([place]) 12/11/2019   PCP:  [name], DO Pharmacy:   [place], [place] [address] Phone: [phone] Fax: [place], [place] [facility] [place] [address] Phone: [phone] Fax: [phone]  [place] [address], [place] [address] Phone: [phone] Fax: [place], [address] [place] [address] Phone: [phone] Fax: [phone]  [facility]  Pharmacy Mail [place], [place] [address] Phone: [phone] Fax: [phone]     Social Determinants of Health (SDOH) Interventions    Readmission Risk Interventions No flowsheet data found.

## 2021-01-07 NOTE — Progress Notes (Signed)
OT Cancellation Note  Patient Details Name: Keslie Gritz MRN: [id number] DOB: October 18, 1951   Cancelled Treatment:    Reason Eval/Treat Not Completed: Pain limiting ability to participate;Other (comment) Per RN, pt had just gotten settled down from waking up hysterical with increased pain. RN request this COTA hold off on OT session at this time, will reattempt session as schedule allows.   Harley Alto., COTA/L Acute Rehabilitation Services [phone]   Precious Haws 01/07/2021, 10:30 AM

## 2021-01-07 NOTE — TOC Progression Note (Signed)
Transition of Care [facility]) - Progression Note    Patient Details  Name: Vannia Pola MRN: [id number] Date of Birth: 1951/03/18  Transition of Care [facility]) CM/SW [place], [place] Phone Number: 01/07/2021, 2:34 PM  Clinical Narrative:     Received message from patient's sister, [name] Family requested [facility] # [phone]. CSW called SNF - left voice message for Admissions Coordinator Ms Ernestina Patches, [place] awaiting returned call.  Thurmond Butts, MSW, LCSW Clinical Social Worker    Expected Discharge Plan: Skilled Nursing Facility Barriers to Discharge: SNF Pending bed offer  Expected Discharge Plan and Services Expected Discharge Plan: [place]                                               Social Determinants of Health (SDOH) Interventions    Readmission Risk Interventions No flowsheet data found.

## 2021-01-07 NOTE — Progress Notes (Signed)
Subjective: NAEs o/n Objective: Vital signs in last 24 hours: Temp:  [97.8 F (36.6 C)-98.4 F (36.9 C)] 98.4 F (36.9 C) (02/10 0755) Pulse Rate:  [55-63] 63 (02/10 0755) Resp:  [17-20] 17 (02/10 0755) BP: (138-163)/(72-86) 155/81 (02/10 0755) SpO2:  [96 %-100 %] 100 % (02/10 0755)  Intake/Output from previous day: 02/09 0701 - 02/10 0700 In: 360 [P.O.:360] Out: 1725 [Urine:1725] Intake/Output this shift: No intake/output data recorded.  Awake, alert, Oriented to person, but limited communication.  0/5 RUE, 2-3/5 RLE. R hemineglect. Incision c/d  Lab Results: No results for input(s): WBC, HGB, HCT, PLT in the last 72 hours. BMET No results for input(s): NA, K, CL, CO2, GLUCOSE, BUN, CREATININE, CALCIUM in the last 72 hours.  Studies/Results: No results found.  Assessment/Plan: S/p crani for resection of large brain metastasis. -I had a long discussion with the patient's sister and this morning.  Given her lack of progress with physical therapy and Occupational Therapy and her declining participation, she is no longer a candidate for rehabilitation.  I discussed options of SNF for palliative care.  Given the patient appears to have significant discomfort and only modest recovery of neurologic function over the past week, it certainly would be reasonable to consider comfort care options.  The sister had discussed this with the family on a conference call and the did wish to proceed with comfort care.  I placed a consult to the palliative medicine team and I will change her to comfort care CODE STATUS.  All questions and concerns were answered.   LOS: 8 days    Sandra Arroyo 01/07/2021, 10:51 AM

## 2021-01-07 NOTE — Consult Note (Signed)
Consultation Note Date: 01/07/2021   Patient Name: Sandra Arroyo  DOB: Apr 15, 1951  MRN: [id number]  Age / Sex: 70 y.o., female  PCP: [name], DO Referring Physician: Vallarie Mare, MD  Reason for Consultation: Establishing goals of care  HPI/Patient Profile: 70 y.o. female  with past medical history of uterine cancer with lung and brain mets, hypertension, diabetes admitted on 01/08/2021 with right sided weakness and speech difficulties in setting of brain mets. S/P SRS and resection of large L parietal lobe. Unfortunately she has continued to decline over course of hospitalization. Family have had discussion with neurosurgery and have made decision to pursue comfort measures.   Clinical Assessment and Goals of Care: I met today at [facility] bedside after discussing case and concerns with RN. RN reports steady decline since surgery with increasing pain and fatigue. She is no longer eating more than a few bites and pills are becoming burdensome to her.   I spoke with Sandra Arroyo's sister, Sandra Arroyo, as Elgie slept. Ann reports to me that Enslee's family (husband, sons) have all had discussion and they all desire comfort care for [place] at this time recognizing that her prognosis is poor and time is limited. We discussed goal to focus on comfort and better managing pain and allowing family to be with her. Discussed NON COVID end of life policy with 3 persons at bedside at a time but may switch out (if over 56 years old). Discussed adjusting medications and scheduling pain medications to better keep her comfortable. Sandra Arroyo explains that her son died in hospice facility a year ago and they are unfortunately familiar with this process.   We also discussed where we go from here as [place] is no longer a good candidate for rehab. Unfortunately Delle's family live in a remote area in [place] and there are limited options. They  do not want her far away and there are no options for hospice facility and only one SNF near them. We discussed potential of placement in this SNF with hospice to follow if bed available. Sandra Arroyo reports this is likely this best plan and will discuss this further with rest of the family. I gave Sandra Arroyo my contact information and encouraged her to share with the rest of the family and not to hesitate to call for any questions/concerns.   All questions/concerns addressed. Emotional support provided. Discussed with TOC recommendations for their local SNF with hospice to follow there.   Primary Decision Maker NEXT OF KIN husband, sons, sister working together    SUMMARY OF RECOMMENDATIONS   - Family request comfort care - Desire placement closer to her home in [place] (only one SNF there and will need hospice support there)  Code Status/Advance Care Planning:  DNR   Symptom Management:   Pain: Morphine IV 2 mg every 4 hours scheduled and every 1 hour as needed. If discharge to SNF order Roxanol concentrated morphine 5-10 mg SL/PO every 4 hours and every hour as needed.  As needed medication for anxiety, seizure, secretions added to  ensure comfort.    Palliative Prophylaxis:   Aspiration, Frequent Pain Assessment, Oral Care and Turn Reposition  Additional Recommendations (Limitations, Scope, Preferences):  Full Comfort Care  Psycho-social/Spiritual:   Desire for further Chaplaincy support:yes  Additional Recommendations: Education on Hospice and Grief/Bereavement Support  Prognosis:   < 2 weeks most likely  Discharge Planning: Skilled Nursing Facility with Hospice      Primary Diagnoses: Present on Admission: . Brain metastasis (HCC)   I have reviewed the medical record, interviewed the patient and family, and examined the patient. The following aspects are pertinent.  Past Medical History:  Diagnosis Date  . Cancer (HCC) 2019  . Diabetes mellitus without complication (HCC)    . Family history of adverse reaction to anesthesia   . Hyperparathyroidism, unspecified (HCC)   . Hypertension   . PONV (postoperative nausea and vomiting)    Social History   Socioeconomic History  . Marital status: Married    Spouse name: Christen Bame  . Number of children: 3  . Years of education: Not on file  . Highest education level: Not on file  Occupational History  . Occupation: Clinical cytogeneticist  Tobacco Use  . Smoking status: Never Smoker  . Smokeless tobacco: Never Used  Vaping Use  . Vaping Use: Never used  Substance and Sexual Activity  . Alcohol use: Never  . Drug use: Never  . Sexual activity: Not on file  Other Topics Concern  . Not on file  Social History Narrative   3 children in [place]   Social Determinants of Health   Financial Resource Strain: Not on [employer] Insecurity: Not on file  Transportation Needs: Not on file  Physical Activity: Not on file  Stress: Not on file  Social Connections: Not on file   Family History  Problem Relation Age of Onset  . Cancer Mother 32       breast ca  . Cancer Father        testicular, kidney cancer, liver  . Stroke Father   . Diabetes Father   . Cancer Brother        testicular cancer   Scheduled Meds: . amLODipine  10 mg Oral Daily  . Chlorhexidine Gluconate Cloth  6 each Topical Daily  . dexamethasone  4 mg Oral Q8H   Followed by  . dexamethasone  4 mg Oral Q12H   Followed by  . [START ON 02-08-2021] dexamethasone  2 mg Oral Q12H   Followed by  . [START ON 01/11/2021] dexamethasone  1 mg Oral Q12H   Followed by  . [START ON 01/14/2021] dexamethasone  1 mg Oral Daily  . docusate sodium  100 mg Oral BID  . enoxaparin (LOVENOX) injection  40 mg Subcutaneous Q24H  . famotidine  20 mg Oral BID  . feeding supplement  237 mL Oral BID BM  . hydrALAZINE  25 mg Oral TID  . insulin aspart  0-15 Units Subcutaneous TID PC  . levETIRAcetam  500 mg Oral BID  . levothyroxine  25 mcg Oral QAC breakfast  .  losartan  100 mg Oral Daily  . metoprolol succinate  100 mg Oral Daily  . pantoprazole  20 mg Oral Daily  . rosuvastatin  10 mg Oral Daily  . terazosin  2 mg Oral QHS   Continuous Infusions: . sodium chloride Stopped (12/31/20 0422)  . 0.9 % NaCl with KCl 20 mEq / L 75 mL/hr at 01/07/21 0135   PRN Meds:.sodium chloride, acetaminophen **OR** acetaminophen,  clonazePAM, HYDROcodone-acetaminophen, labetalol, labetalol, methocarbamol, morphine injection, [DISCONTINUED] ondansetron **OR** ondansetron (ZOFRAN) IV, ondansetron, polyethylene glycol, prochlorperazine, promethazine, sodium phosphate Allergies  Allergen Reactions  . Lisinopril Swelling    Face swelling Face swelling   . Verapamil Rash  . Other Rash    ADHESIVES ADHESIVES ADHESIVES ADHESIVES    Review of Systems  Unable to perform ROS: Acuity of condition    Physical Exam Vitals and nursing note reviewed.  Constitutional:      General: She is sleeping. She is not in acute distress.    Appearance: She is ill-appearing.  Cardiovascular:     Rate and Rhythm: Bradycardia present.  Pulmonary:     Effort: Pulmonary effort is normal. No tachypnea, accessory muscle usage or respiratory distress.  Abdominal:     General: Abdomen is flat.  Neurological:     Comments: Sleeping     Vital Signs: BP 138/74 (BP Location: Left Arm)   Pulse (!) 59   Temp 98.4 F (36.9 C) (Oral)   Resp 17   Ht $R'5\' 2"'Sg$  (1.575 m)   Wt 66.7 kg   SpO2 96%   BMI 26.90 kg/m  Pain Scale: Faces POSS *See Group Information*: S-Acceptable,Sleep, easy to arouse Pain Score: Asleep   SpO2: SpO2: 96 % O2 Device:SpO2: 96 % O2 Flow Rate: .O2 Flow Rate (L/min): 2 L/min  IO: Intake/output summary:   Intake/Output Summary (Last 24 hours) at 01/07/2021 1234 Last data filed at 01/07/2021 0830 Gross per 24 hour  Intake 240 ml  Output 1725 ml  Net -1485 ml    LBM: Last BM Date: 01/06/21 Baseline Weight: Weight: 68 kg Most recent weight: Weight:  66.7 kg     Palliative Assessment/Data:     Time In: 1230 Time Out: 1310 Time Total: 40 min Greater than 50%  of this time was spent counseling and coordinating care related to the above assessment and plan.  Signed by: Vinie Sill, NP Palliative Medicine Team Pager # [phone] (M-F 8a-5p) Team Phone # [phone] (Nights/Weekends)

## 2021-01-07 NOTE — Progress Notes (Signed)
Nutrition Brief Note  Chart reviewed. Family wishes to proceed with comfort care for patient. No further nutrition interventions warranted at this time.  Please consult as needed.   Corrin Parker, MS, RD, LDN RD pager number/after hours weekend pager number on Amion.

## 2021-01-08 ENCOUNTER — Telehealth: Payer: Self-pay | Admitting: Hematology and Oncology

## 2021-01-08 MED ORDER — LORAZEPAM 2 MG/ML IJ SOLN: 1.0000 mg | INTRAMUSCULAR | Status: DC | PRN

## 2021-01-08 MED ORDER — MORPHINE BOLUS VIA INFUSION
2.0000 mg | INTRAVENOUS | Status: DC | PRN
  Administered 2021-01-09: 2 mg via INTRAVENOUS
  Filled 2021-01-08: qty 2

## 2021-01-08 MED ORDER — MORPHINE SULFATE (PF) 2 MG/ML IV SOLN: 2.0000 mg | INTRAVENOUS | Status: AC

## 2021-01-08 MED ORDER — LEVETIRACETAM IN NACL 500 MG/100ML IV SOLN
500.0000 mg | Freq: Two times a day (BID) | INTRAVENOUS | Status: DC
  Administered 2021-01-08 – 2021-01-09 (×2): 500 mg via INTRAVENOUS
  Filled 2021-01-08 (×3): qty 100

## 2021-01-08 MED ORDER — LORAZEPAM 2 MG/ML IJ SOLN
1.0000 mg | INTRAMUSCULAR | Status: DC
  Administered 2021-01-08 – 2021-01-09 (×7): 1 mg via INTRAVENOUS
  Filled 2021-01-08 (×5): qty 1

## 2021-01-08 MED ORDER — MORPHINE 100MG IN NS 100ML (1MG/ML) PREMIX INFUSION
1.0000 mg/h | INTRAVENOUS | Status: DC
  Administered 2021-01-08: 2 mg/h via INTRAVENOUS
  Filled 2021-01-08 (×2): qty 100

## 2021-01-08 NOTE — Progress Notes (Signed)
Subjective: NAEs o/n  Objective: Vital signs in last 24 hours: Temp:  [98.4 F (36.9 C)-98.9 F (37.2 C)] 98.9 F (37.2 C) (02/10 1925) Pulse Rate:  [59-72] 72 (02/10 1925) Resp:  [17-18] 18 (02/10 1925) BP: (138-153)/(74-85) 153/85 (02/10 1925) SpO2:  [96 %-97 %] 97 % (02/10 1925)  Intake/Output from previous day: 02/10 0701 - 02/11 0700 In: 240 [P.O.:240] Out: 1400 [Urine:1400] Intake/Output this shift: No intake/output data recorded.  Patient resting comfortably.  I elected not to disturb her in order to maximize her comfort  Lab Results: No results for input(s): WBC, HGB, HCT, PLT in the last 72 hours. BMET No results for input(s): NA, K, CL, CO2, GLUCOSE, BUN, CREATININE, CALCIUM in the last 72 hours.  Studies/Results: No results found.  Assessment/Plan: 70 yo F with with uterine cancer who developed dense neurologic deficits and was found to havemultiple brain mets, now s/p resection of large met - unfortunately, she has not demonstrated much improvement since surgery - awaiting SNF/hospice  Vallarie Mare 01/08/2021, 9:06 AM

## 2021-01-08 NOTE — NC FL2 (Signed)
[place] LEVEL OF CARE SCREENING TOOL     IDENTIFICATION  Patient Name: Sandra Arroyo Birthdate: Nov 30, 1950 Sex: female Admission Date (Current Location): 01/12/2021  [facility] and [place] Number:  Herbalist and Address:  [facility] [place], [place] [address] [place], [place] [address]      Provider Number: [id number]  Attending Physician Name and Address:  Vallarie Mare, MD  Relative Name and Phone Number:       Current Level of Care: Hospital Recommended Level of Care: [place] Prior Approval Number:    Date Approved/Denied:   PASRR Number:    Discharge Plan: SNF    Current Diagnoses: Patient Active Problem List   Diagnosis Date Noted  . Status post craniotomy 12/29/2020  . Brain metastasis ([place]) 12/23/2020  . Metastasis to brain ([place]) 12/22/2020  . Acquired hypothyroidism 12/11/2020  . Chronic diarrhea 12/01/2020  . Skin rash 12/01/2020  . 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 Type 2 diabetes mellitus with hyperglycemia, without long-term current use of insulin ([place]) 10/02/2020  . Hyperparathyroidism, unspecified ([place])   . Port-A-Cath in place 01/13/2020  . Pulmonary infiltrates 12/19/2019  . Kidney lesion, native, left 12/19/2019  . Goals of care, counseling/discussion 12/12/2019  . Uterine cancer ([place]) 12/11/2019  . 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 Other fatigue 12/11/2019  . Pancytopenia, acquired ([place]) 12/11/2019  . Family history of cancer 12/11/2019  . Diabetes mellitus without complication, without long-term current use of insulin ([place]) 12/11/2019  . Essential hypertension 12/11/2019  . Peripheral neuropathy due to chemotherapy ([place]) 12/11/2019    Orientation RESPIRATION BLADDER Height & Weight     Self  Normal External catheter,Continent Weight: 147 lb 0.8 oz (66.7 kg) Height:  5\' 2"  (157.5 cm)  BEHAVIORAL SYMPTOMS/MOOD NEUROLOGICAL BOWEL NUTRITION STATUS      Incontinent  Diet (please see discharge summary)  AMBULATORY STATUS COMMUNICATION OF NEEDS Skin     Verbally Surgical wounds (closed incision,head LFT)                       Personal Care Assistance Level of Assistance  Bathing,Feeding,Dressing Bathing Assistance: Maximum assistance Feeding assistance: Maximum assistance Dressing Assistance: Maximum assistance     Functional Limitations Info  Sight,Hearing,Speech Sight Info: Adequate Hearing Info: Adequate Speech Info: Impaired    SPECIAL CARE FACTORS FREQUENCY  PT (By licensed PT),OT (By licensed OT)     PT Frequency: 3x per week OT Frequency: 3x per week            Contractures Contractures Info: Not present    Additional Factors Info  Code Status,Allergies Code Status Info: DNR Allergies Info: Lisinopril,Verapamil,ADHESIVES ADHESIVES           Current Medications (01/08/2021):  This is the current hospital active medication list Current Facility-Administered Medications  Medication Dose Route Frequency Provider Last Rate Last Admin  . 0.9 %  sodium chloride infusion   Intravenous PRN Vallarie Mare, MD   Stopped at 12/31/20 0422  . 0.9 % NaCl with KCl 20 mEq/ L  infusion   Intravenous Continuous Vallarie Mare, MD 75 mL/hr at 01/08/21 0411 New Bag at 01/08/21 0411  . acetaminophen (TYLENOL) tablet 650 mg  650 mg Oral Q4H PRN Vallarie Mare, MD   650 mg at 01/02/21 1333   Or  . acetaminophen (TYLENOL) suppository 650 mg  650 mg Rectal Q4H PRN Vallarie Mare, MD      .  antiseptic oral rinse (BIOTENE) solution 15 mL  15 mL Topical PRN Pershing Proud, NP      . Chlorhexidine Gluconate Cloth 2 % PADS 6 each  6 each Topical Daily Vallarie Mare, MD   6 each at 01/04/21 [phone]  . clonazePAM (KLONOPIN) tablet 0.5 mg  0.5 mg Oral QHS PRN Vallarie Mare, MD   0.5 mg at 01/06/21 2116  . dexamethasone (DECADRON) tablet 4 mg  4 mg Oral Q12H Vallarie Mare, MD   4 mg at 01/07/21 2100   Followed by  .  [START ON 2021/02/08] dexamethasone (DECADRON) tablet 2 mg  2 mg Oral Q12H Vallarie Mare, MD       Followed by  . [START ON 01/11/2021] dexamethasone (DECADRON) tablet 1 mg  1 mg Oral Q12H Vallarie Mare, MD       Followed by  . [START ON 01/14/2021] dexamethasone (DECADRON) tablet 1 mg  1 mg Oral Daily Vallarie Mare, MD      . famotidine (PEPCID) tablet 20 mg  20 mg Oral BID Vallarie Mare, MD   20 mg at 01/07/21 2059  . feeding supplement (ENSURE ENLIVE / ENSURE PLUS) liquid 237 mL  237 mL Oral BID BM PRN Pershing Proud, NP      . glycopyrrolate (ROBINUL) tablet 1 mg  1 mg Oral O1H PRN Pershing Proud, NP       Or  . glycopyrrolate (ROBINUL) injection 0.2 mg  0.2 mg Subcutaneous Y8M PRN Pershing Proud, NP       Or  . glycopyrrolate (ROBINUL) injection 0.2 mg  0.2 mg Intravenous V7Q PRN Pershing Proud, NP      . HYDROcodone-acetaminophen (NORCO/VICODIN) 5-325 MG per tablet 1 tablet  1 tablet Oral Q6H PRN Vallarie Mare, MD   1 tablet at 01/06/21 2118  . labetalol (NORMODYNE) injection 10 mg  10 mg Intravenous Q10 min PRN Vallarie Mare, MD      . levETIRAcetam (KEPPRA) tablet 500 mg  500 mg Oral BID Vallarie Mare, MD   500 mg at 01/07/21 2100  . levothyroxine (SYNTHROID) tablet 25 mcg  25 mcg Oral QAC breakfast Vallarie Mare, MD   25 mcg at 01/08/21 0618  . LORazepam (ATIVAN) injection 1-2 mg  1-2 mg Intravenous I6N PRN Pershing Proud, NP      . methocarbamol (ROBAXIN) tablet 500 mg  500 mg Oral Q6H PRN Traci Sermon, PA-C   500 mg at 01/08/21 0012  . metoprolol succinate (TOPROL-XL) 24 hr tablet 100 mg  100 mg Oral Daily Vallarie Mare, MD   100 mg at 01/06/21 1011  . morphine 2 MG/ML injection 2 mg  2 mg Intravenous G2X PRN Pershing Proud, NP   2 mg at 01/08/21 0620  . morphine 2 MG/ML injection 2 mg  2 mg Intravenous B2W Pershing Proud, NP   2 mg at 01/08/21 0408  . ondansetron (ZOFRAN) injection 4 mg  4 mg Intravenous Q4H PRN Vallarie Mare, MD      . ondansetron [facility]) tablet 8 mg  8 mg Oral Q8H PRN Vallarie Mare, MD      . pantoprazole (PROTONIX) EC tablet 20 mg  20 mg Oral Daily Vallarie Mare, MD   20 mg at 01/06/21 1012  . polyethylene glycol (MIRALAX / GLYCOLAX) packet 17 g  17 g Oral Daily PRN Vallarie Mare, MD      .  polyvinyl alcohol (LIQUIFILM TEARS) 1.4 % ophthalmic solution 1 drop  1 drop Both Eyes QID PRN Pershing Proud, NP      . prochlorperazine (COMPAZINE) tablet 10 mg  10 mg Oral Q6H PRN Vallarie Mare, MD      . promethazine (PHENERGAN) tablet 12.5-25 mg  12.5-25 mg Oral Q4H PRN Vallarie Mare, MD      . sodium phosphate (FLEET) 7-19 GM/118ML enema 1 enema  1 enema Rectal Once PRN Vallarie Mare, MD         Discharge Medications: Please see discharge summary for a list of discharge medications.  Relevant Imaging Results:  Relevant Lab Results:   Additional Information SSN [id number]  Palliative care to follow at SNF  [place]

## 2021-01-08 NOTE — Progress Notes (Signed)
Pt transferred to 6N23 via hospital bed. Family at bedside. Report given to RN. Katherina Right RN

## 2021-01-08 NOTE — Progress Notes (Signed)
Patient has been very emotional tonight.  Patient has been medicated for pain but is tearful; unable to articulate feelings into words and sentences.  Emotional support given to patient.  Patient repositioned and redirected through conversation, television, and rest.  Patient is awaiting family visit tomorrow.  Will continue to monitor patient and anticipate needs she cannot verbalize.

## 2021-01-08 NOTE — Telephone Encounter (Signed)
I reviewed her chart and noted that the patient has progressive decline I spoke with her sister briefly I also reviewed with palliative care about plan for pain management

## 2021-01-08 NOTE — TOC Progression Note (Signed)
Transition of Care [facility]) - Progression Note    Patient Details  Name: Sandra Arroyo MRN: [id number] Date of Birth: September 17, 1951  Transition of Care [facility]) CM/SW [place], [place] Phone Number: 01/08/2021, 9:43 AM  Clinical Narrative:     [place] - Left voice message and sent email to admission coordinator - waiting on response  Thurmond Butts, MSW, LCSW Clinical Social Worker   Expected Discharge Plan: Skilled Nursing Facility Barriers to Discharge: SNF Pending bed offer  Expected Discharge Plan and Services Expected Discharge Plan: [place]                                               Social Determinants of Health (SDOH) Interventions    Readmission Risk Interventions No flowsheet data found.

## 2021-01-08 NOTE — TOC Progression Note (Signed)
Transition of Care [facility]) - Progression Note    Patient Details  Name: Sandra Arroyo MRN: [id number] Date of Birth: 06/06/1951  Transition of Care [facility]) CM/SW [place], [place] Phone Number: 01/08/2021, 3:47 PM  Clinical Narrative:     Sandra Arroyo DNR on chart for MD signature Called patient's spouse - left voice message to return call- Sent text message to patient's sister,Sandra Arroyo- advised CSW has not received response from SNF.  CSW will continue to follow and assist with discharge planning.  Thurmond Butts, MSW, LCSW Clinical Social Worker   Expected Discharge Plan: Skilled Nursing Facility Barriers to Discharge: SNF Pending bed offer  Expected Discharge Plan and Services Expected Discharge Plan: [place]                                               Social Determinants of Health (SDOH) Interventions    Readmission Risk Interventions No flowsheet data found.

## 2021-01-08 NOTE — Progress Notes (Signed)
Palliative Medicine RN Note: Patient was seen with PMT PA Fellow Dorthy Cooler and discussed with Dr Hilma Favors.  I rec'd a call from Dr Alvy Bimler, who had concerns about her pain. She is getting scheduled morphine and has IVF at 75 ml/hour.  When we arrived, her husband and 2 sons are at her bedside. Kelsi was moaning and mumbling repetitively and nonsensically. She was able to clearly state "YES!" when asked about her pain, and "no" when asked about trouble breathing. When offered water, she was able to indicate that she wanted ice water by using yes/no questions. I had the RN bring prn morphine while Josseline put in orders for a morphine continuous infusion and adjusted her prn and scheduled Ativan. We also changed her Keppra to IV in case she loses the ability to swallow. The prn morphine was not enough, and I got an order for stat morphine to try to get her over the hump to where her morphine is infusing continuously.  Josseline and I sat with Mr Shifrin outside the room. I explained the Purewick, as the suction was audible. We also discussed Arrielle's marked change, including her anxiety overnight. I have concerns that her prognosis has shortened considerably, and I do not feel she is comfortable or stable enough for a transfer to an in-town hospice, much less any kind of facility in [place]. Mr Plazola sole concern is Scientific laboratory technician. He reports that he really didn't understand how bad it was until he saw her. He tearfully verbalized understanding of the PMT recs to stay at [facility], and he is unwilling to accept the risk of a painful death during a discharge/transport to a hospice facility. He is aware that, should she stabilize, we may be having a very different conversation in a few days. The family was having a lot of distress surrounding the idea of trying to move her, and any discussions about this will likely cause significant suffering for the family. It would be difficult to manage her pain during  transport, as she was crying out even when I touched her hand, and infusions would likely need to be stopped for transport.  We discussed cessation of IV fluids, as we do not want to overload any system that may be shutting down and would eventually lead to trouble breathing.  Mr Warmoth is very clear that he knows Larenda is going to heaven and that their faith is strong. I have requested Spiritual Care follow up with them to ensure the family's spiritual needs are met.   PMT will continue to follow Tiny[place]. I suspect that her family will need plenty of support during this process. Dr Hilma Favors has a very low threshold for starting an Ativan drip if her agitation is not better controlled by tomorrow.  If patient remains symptomatic despite maximum doses, please call PMT at [phone] between 0700 and 1900. Outside of these hours, please call attending, as PMT does not have night coverage.  Marjie Skiff Absalom Aro, RN, BSN, [facility] Palliative Medicine Team 01/08/2021 4:05 PM Office [phone]

## 2021-01-08 NOTE — Progress Notes (Signed)
Pt receive, orient family and pt to room, call button in place. Gave sips of apple juice and pt tolerated well. Turn pt left side with pillows to support. Skin intact.

## 2021-01-08 NOTE — Progress Notes (Signed)
Inpatient Rehab Admissions Coordinator:  Note pt transitioned to comfort care.  Awaiting SNF/hospice placement. CIR orders have been discontinued.  If changes occur, please reconsult.   Gayland Curry, [place], [place] Admissions Coordinator [phone]

## 2021-01-09 DIAGNOSIS — Z7189 Other specified counseling: Secondary | ICD-10-CM | POA: Diagnosis not present

## 2021-01-09 DIAGNOSIS — Z66 Do not resuscitate: Secondary | ICD-10-CM | POA: Diagnosis not present

## 2021-01-09 DIAGNOSIS — Z515 Encounter for palliative care: Secondary | ICD-10-CM | POA: Diagnosis not present

## 2021-01-09 MED ORDER — METOPROLOL TARTRATE 5 MG/5ML IV SOLN
10.0000 mg | Freq: Four times a day (QID) | INTRAVENOUS | Status: DC
  Filled 2021-01-09 (×2): qty 10

## 2021-01-09 MED ORDER — DEXAMETHASONE SODIUM PHOSPHATE 4 MG/ML IJ SOLN: 1.0000 mg | INTRAMUSCULAR | Status: DC

## 2021-01-09 MED ORDER — DEXAMETHASONE SODIUM PHOSPHATE 4 MG/ML IJ SOLN: 2.0000 mg | Freq: Two times a day (BID) | INTRAMUSCULAR | Status: DC

## 2021-01-09 MED ORDER — DEXAMETHASONE SODIUM PHOSPHATE 4 MG/ML IJ SOLN
4.0000 mg | Freq: Two times a day (BID) | INTRAMUSCULAR | Status: DC
  Administered 2021-01-09: 4 mg via INTRAVENOUS
  Filled 2021-01-09 (×2): qty 1

## 2021-01-09 MED ORDER — PANTOPRAZOLE SODIUM 40 MG IV SOLR: 20.0000 mg | INTRAVENOUS | Status: DC

## 2021-01-09 MED ORDER — DEXAMETHASONE SODIUM PHOSPHATE 4 MG/ML IJ SOLN: 1.0000 mg | Freq: Two times a day (BID) | INTRAMUSCULAR | Status: DC

## 2021-01-09 MED ORDER — FAMOTIDINE IN NACL 20-0.9 MG/50ML-% IV SOLN
20.0000 mg | Freq: Two times a day (BID) | INTRAVENOUS | Status: DC
  Administered 2021-01-09: 20 mg via INTRAVENOUS
  Filled 2021-01-09 (×2): qty 50

## 2021-01-22 ENCOUNTER — Other Ambulatory Visit: Payer: Medicare Other

## 2021-01-22 ENCOUNTER — Ambulatory Visit: Payer: Medicare Other | Admitting: Hematology and Oncology

## 2021-01-22 ENCOUNTER — Ambulatory Visit: Payer: Medicare Other

## 2021-01-26 NOTE — Plan of Care (Signed)
  Problem: Role Relationship: Goal: Family's ability to cope with current situation will improve Outcome: Progressing   

## 2021-01-26 NOTE — Progress Notes (Signed)
   Palliative Medicine Inpatient Follow Up Note  HPI:  70 y.o. female  with past medical history of uterine cancer with lung and brain mets, hypertension, diabetes admitted on 01/22/2021 with right sided weakness and speech difficulties in setting of brain mets. S/P SRS and resection of large L parietal lobe. Unfortunately she has continued to decline over course of hospitalization. Family have had discussion with neurosurgery and have made decision to pursue comfort measures.   Today's Discussion (08-Feb-2021 ):  *Please note that this is a verbal dictation therefore any spelling or grammatical errors are due to the "[place]" system interpretation.  I met with patient and his bedside RN this morning. She shares with me that patients symptoms have been well controlled throughout the night. Patients son Edd Arbour expressed that she has been "out" since last evening. He asked if she is in any distress without eating or drinking and recounted has she was drinking last night. We reviewed how as we are encroaching our end of life we no longer feel the want, will, or desire to eat or drink. Provided "Gone from my Sight" book.   Questions and concerns addressed   Objective Assessment: Vital Signs Vitals:   01/08/21 1724 February 08, 2021 0416  BP: (!) 173/95 (!) 105/52  Pulse: 62 68  Resp: 13 16  Temp: 98.7 F (37.1 C) 98.5 F (36.9 C)  SpO2: 94% (!) 82%    Intake/Output Summary (Last 24 hours) at February 08, 2021 1249 Last data filed at Feb 08, 2021 0730 Gross per 24 hour  Intake 137.49 ml  Output 300 ml  Net -162.51 ml   Last Weight  Most recent update: 01/07/2021  1:48 PM   Weight  66.7 kg (147 lb 0.8 oz)           Gen:  Elderly caucasian F in NAD HEENT: Dry mucous membranes CV: Regular rate and rhythm  PULM:On RA ABD: soft/nontender  EXT: No edema  Neuro: Comatose  SUMMARY OF RECOMMENDATIONS   DNAR/DNI  Continue comfort focused care - initiated on morphine gtt and ativan atc on  2/11  Unrestricted visitation  Patient no longer deemed safe to transfer from the hospital  Time Spent: 25 Greater than 50% of the time was spent in counseling and coordination of care ______________________________________________________________________________________ [place] Team Team Cell Phone: [phone] Please utilize secure chat with additional questions, if there is no response within 30 minutes please call the above phone number  Palliative Medicine Team providers are available by phone from 7am to 7pm daily and can be reached through the team cell phone.  Should this patient require assistance outside of these hours, please call the patient's attending physician.

## 2021-01-26 NOTE — Progress Notes (Signed)
  NEUROSURGERY PROGRESS NOTE   No changes.  Plan is for patient to remain at [facility] for EOL. Appreciate PMT family discussion/management.

## 2021-01-26 DEATH — deceased

## 2021-02-02 ENCOUNTER — Ambulatory Visit: Payer: Medicare Other | Admitting: Internal Medicine

## 2021-02-19 ENCOUNTER — Other Ambulatory Visit (HOSPITAL_COMMUNITY): Payer: Self-pay

## 2021-03-28 NOTE — Discharge Summary (Signed)
  Physician Discharge Summary  Patient ID: Sandra Arroyo MRN: [id number] DOB/AGE: 02-Jun-1951 70 y.o.  Admit date: 01/19/2021 Discharge date: 03/15/2021  Admission Diagnoses:  Metastatic brain disease  Discharge Diagnoses:  Same Active Problems:   Brain metastasis [facility])   Status post craniotomy   Discharged Condition: Deceased  Hospital Course:  Sandra Arroyo is a 70 y.o. female  with uterine cancer who developed dense neurologic deficits and was found to have multiple brain mets.    She had a very large  Brain mass, for which surgical resection was recommended after interdisciplinary discussion.  She was having  Rapid progression of her neurologic deficits preop, and  Was brought to the hospital for surgical resection on 02/02.  Unfortunately, postoperatively, she continued to have dense neurologic weakness, and  Progressive decline in her functional status.  Initially, the plan was to discharge her  To rehabilitation, but  As she was not demonstrating significant improvement,  Family elected to proceed to hospice and palliative care.   She expired on 27-Jan-2021.   Treatments: Surgery -   Left craniotomy for resection of brain metastasis    Disposition:  There are no questions and answers to display.         Allergies as of 01/27/21      Reactions   Lisinopril Swelling   Face swelling Face swelling   Verapamil Rash   Other Rash   ADHESIVES ADHESIVES ADHESIVES ADHESIVES      Medication List    ASK your doctor about these medications   amLODipine 10 MG tablet Commonly known as: NORVASC Take 1 tablet (10 mg total) by mouth daily.   blood glucose meter kit and supplies Dispense based on patient and insurance preference. Use up to four times daily as directed. (FOR ICD-10 E10.9, E11.9).   hydrALAZINE 25 MG tablet Commonly known as: APRESOLINE Take 1 tablet (25 mg total) by mouth 3 (three) times daily.   lidocaine-prilocaine cream Commonly known as: EMLA Apply  to affected area once   losartan 100 MG tablet Commonly known as: COZAAR Take 100 mg by mouth daily.   Pen Needles 3/16" 31G X 5 MM Misc Use as directed with insulin pen   rosuvastatin 10 MG tablet Commonly known as: CRESTOR Take 1 tablet (10 mg total) by mouth daily.   terazosin 2 MG capsule Commonly known as: HYTRIN Take 1 capsule (2 mg total) by mouth at bedtime.        Signed: Vallarie Mare 03/15/2021, 5:50 PM

## 2021-04-06 ENCOUNTER — Ambulatory Visit: Payer: Medicare Other | Admitting: Family Medicine

## 2021-12-04 IMAGING — MR MR HEAD WO/W CM
14 of 16 series · 42 of 48 positions shown · IV contrast (gadavist)
Comparison: 12/24/2020

CLINICAL DATA: Metastatic disease

EXAM:
MRI HEAD WITHOUT AND WITH CONTRAST
TECHNIQUE: Multiplanar, multiecho pulse sequences of the brain and surrounding
structures were obtained without and with intravenous contrast.
CONTRAST:  7.5mL GADAVIST GADOBUTROL 1 MMOL/ML IV SOLN

[Series 5: DWI · axial · 3.0mm · 0.88mm/px · z∈[-42,+101]mm · 8 of 100 slices shown (1 of 4)]
[im 1/100]
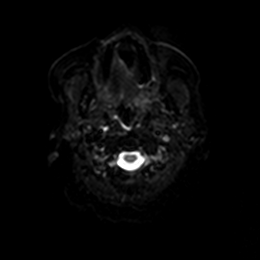
[im 15/100]
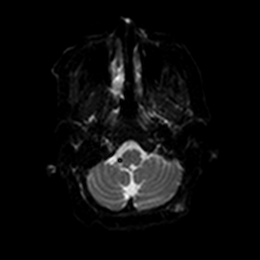
[im 29/100]
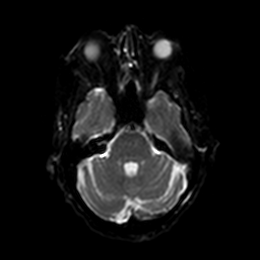
[im 43/100]
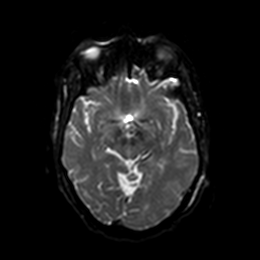
[im 57/100]
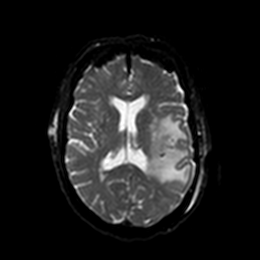
[im 71/100]
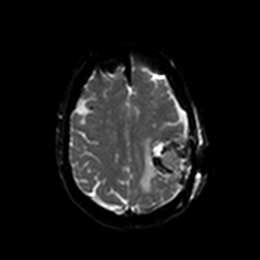
[im 85/100]
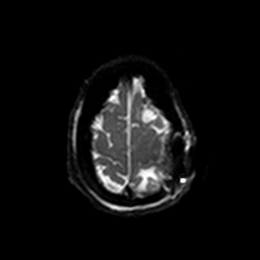
[im 100/100]
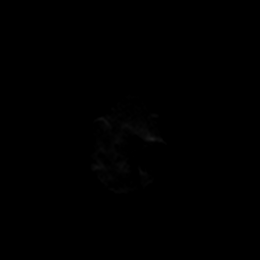

[Series 6: DWI · axial · 3.0mm · 0.88mm/px · z∈[-42,+101]mm · 3 of 49 slices shown (2 of 4)]
[im 1/49]
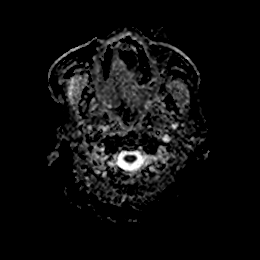
[im 25/49]
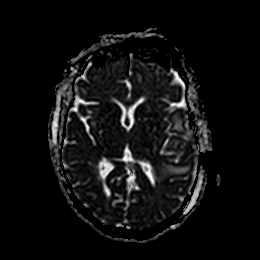
[im 49/49]
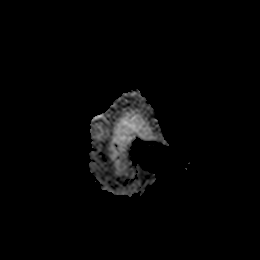

[Series 7: DWI · coronal · 4.0mm · 0.88mm/px · 5 of 70 slices shown (3 of 4)]
[im 1/70]
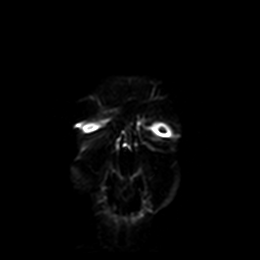
[im 18/70]
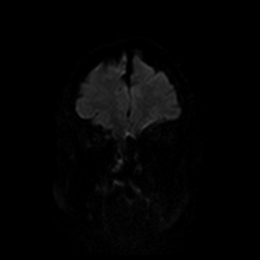
[im 35/70]
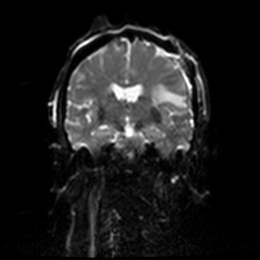
[im 52/70]
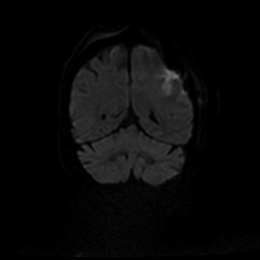
[im 70/70]
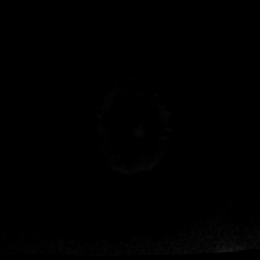

[Series 8: DWI · coronal · 4.0mm · 0.88mm/px · 2 of 35 slices shown (4 of 4)]
[im 1/35]
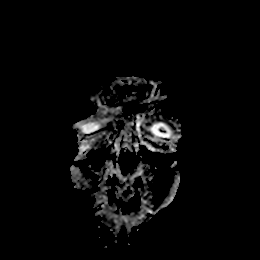
[im 35/35]
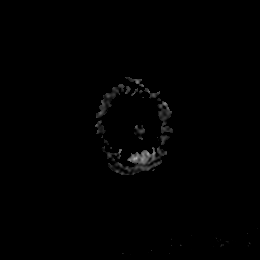

[Series 9: FLAIR · axial · 5.0mm · 0.45mm/px · z∈[-45,+95]mm · 2 of 25 slices shown]
[im 1/25]
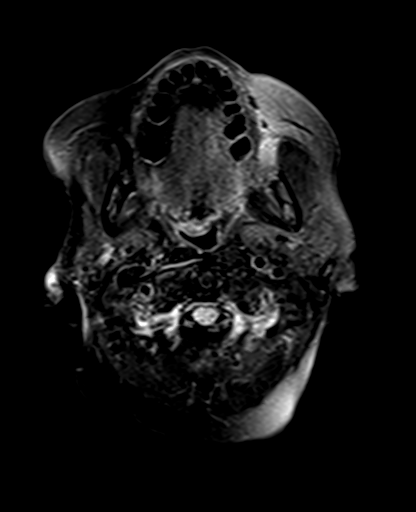
[im 25/25]
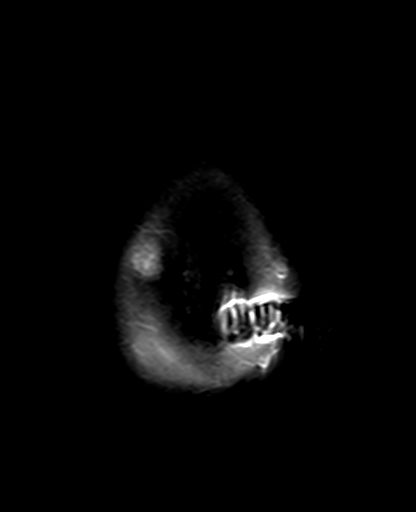

[Series 10: T2 · axial · 5.0mm · 0.72mm/px · z∈[-40,+100]mm · 2 of 25 slices shown]
[im 1/25]
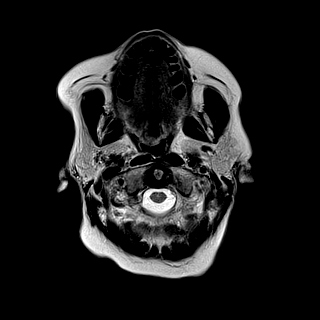
[im 25/25]
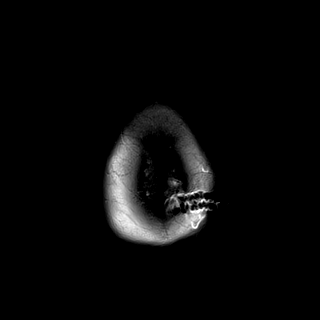

[Series 11: T1 · sagittal · 5.0mm · 0.75mm/px · 2 of 23 slices shown]
[im 1/23]
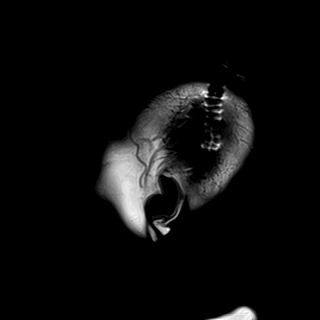
[im 23/23]
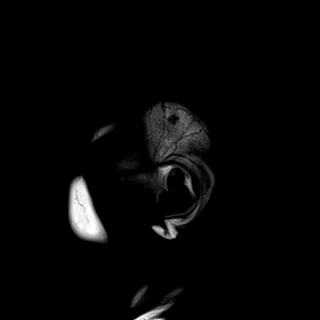

[Series 12: mag_images · axial · 3.0mm · 0.90mm/px · z∈[-50,+99]mm · 3 of 52 slices shown]
[im 1/52]
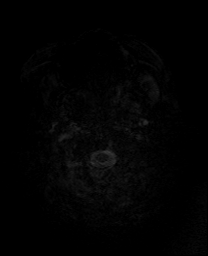
[im 26/52]
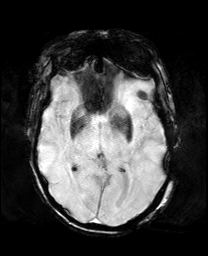
[im 52/52]
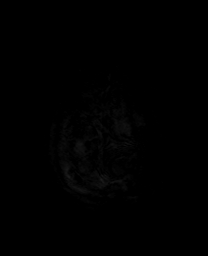

[Series 13: pha_images · axial · 3.0mm · 0.90mm/px · z∈[-50,+99]mm · 3 of 52 slices shown]
[im 1/52]
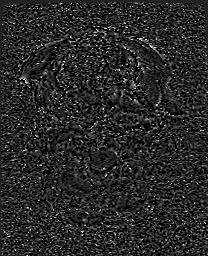
[im 26/52]
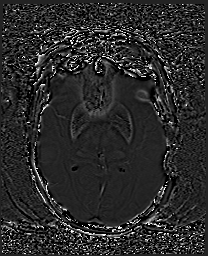
[im 52/52]
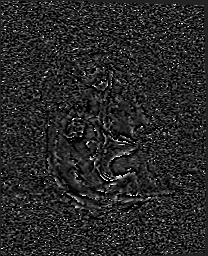

[Series 14: swi_images · axial · 3.0mm · 0.90mm/px · z∈[-50,+99]mm · 3 of 52 slices shown]
[im 1/52]
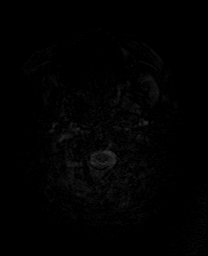
[im 26/52]
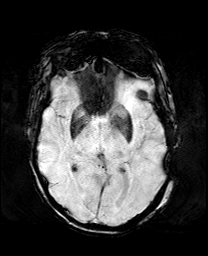
[im 52/52]
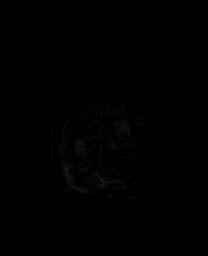

[Series 15: mip_images(sw) · axial · 24.0mm · 0.90mm/px · z∈[-40,+89]mm · 3 of 45 slices shown]
[im 1/45]
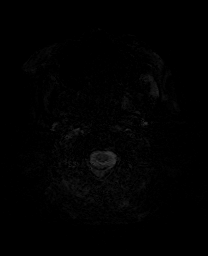
[im 23/45]
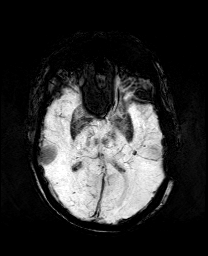
[im 45/45]
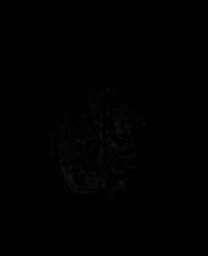

[Series 17: T2 post-contrast · coronal · 5.0mm · 0.72mm/px · 2 of 30 slices shown]
[im 1/30]
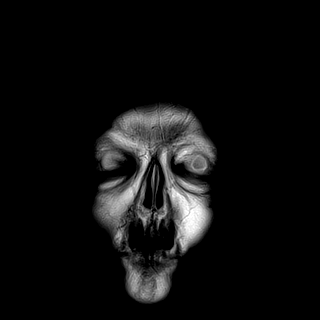
[im 30/30]
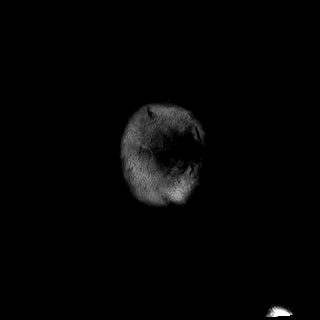

[Series 19: T1 post-contrast · coronal · 5.0mm · 0.34mm/px · 2 of 30 slices shown (1 of 2)]
[im 1/30]
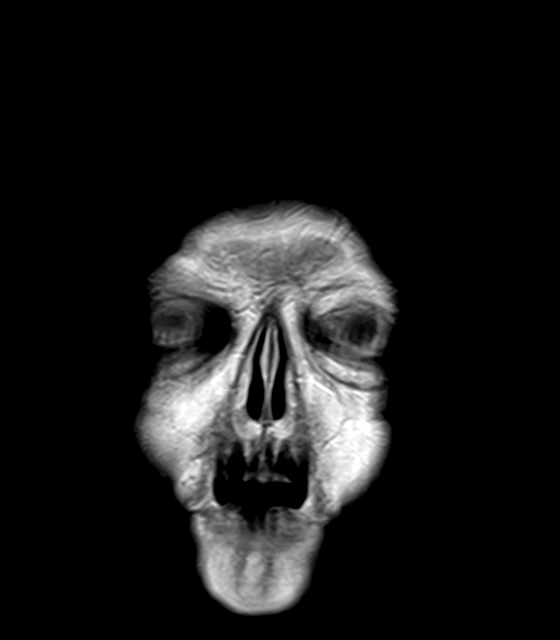
[im 30/30]
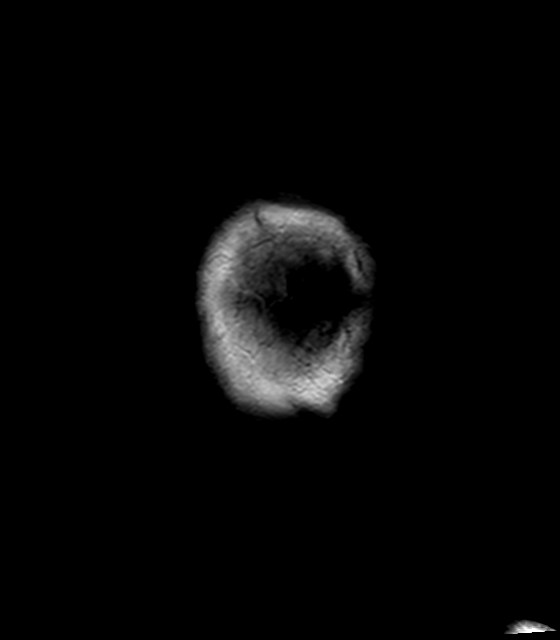

[Series 20: T1 post-contrast · sagittal · 5.0mm · 0.72mm/px · 2 of 23 slices shown (2 of 2)]
[im 1/23]
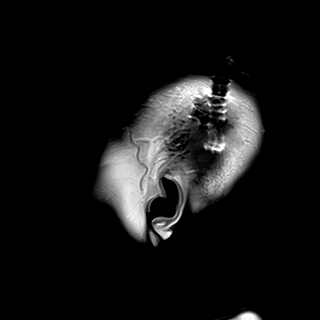
[im 23/23]
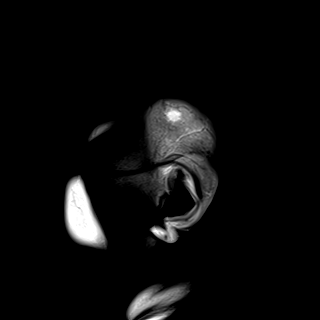

[42 of 48 positions shown; findings below may reference images not displayed]

FINDINGS: Brain: 3 brain metastases by MRI. The largest, which is at the left
frontal parietal junction, has been resected with expected blood
products, regional edema, and regional diffusion hyperintensity. No
worrisome or nodular adjacent enhancement when allowing for minimal
post surgical enhancement especially at the anterior margin. A 16 mm
high left frontal mass and a 3 mm high right frontal mass are
unchanged. No hydrocephalus, unexpected collection, or unexpected
hemorrhage. There is expected small volume fluid with gas deep to
the bone flap, with regional reactive dural thickening.

Vascular: Negative

Skull and upper cervical spine: Unremarkable left craniotomy

Sinuses/Orbits: Negative
IMPRESSION: 1. No unexpected finding after left frontal parietal metastasis
resection.
2. Smaller bifrontal metastases are unchanged.
